# Patient Record
Sex: Male | Born: 1945
Health system: Southern US, Community
[De-identification: ages and names within clinical notes are randomized; demographics above are authoritative.]

## PROBLEM LIST (undated history)

## (undated) DIAGNOSIS — I509 Heart failure, unspecified: Secondary | ICD-10-CM

## (undated) DIAGNOSIS — I10 Essential (primary) hypertension: Secondary | ICD-10-CM

## (undated) DIAGNOSIS — C801 Malignant (primary) neoplasm, unspecified: Secondary | ICD-10-CM

## (undated) DIAGNOSIS — G4730 Sleep apnea, unspecified: Secondary | ICD-10-CM

## (undated) DIAGNOSIS — I472 Ventricular tachycardia, unspecified: Secondary | ICD-10-CM

## (undated) DIAGNOSIS — H269 Unspecified cataract: Secondary | ICD-10-CM

## (undated) DIAGNOSIS — I519 Heart disease, unspecified: Secondary | ICD-10-CM

## (undated) DIAGNOSIS — I499 Cardiac arrhythmia, unspecified: Secondary | ICD-10-CM

## (undated) DIAGNOSIS — E119 Type 2 diabetes mellitus without complications: Secondary | ICD-10-CM

## (undated) DIAGNOSIS — I219 Acute myocardial infarction, unspecified: Secondary | ICD-10-CM

## (undated) DIAGNOSIS — Q251 Coarctation of aorta: Secondary | ICD-10-CM

## (undated) DIAGNOSIS — Q253 Supravalvular aortic stenosis: Secondary | ICD-10-CM

## (undated) DIAGNOSIS — E785 Hyperlipidemia, unspecified: Secondary | ICD-10-CM

## (undated) HISTORY — DX: Essential (primary) hypertension: I10

## (undated) HISTORY — PX: CARDIAC DEFIBRILLATOR PLACEMENT: SHX171

## (undated) HISTORY — DX: Ventricular tachycardia, unspecified: I47.20

## (undated) HISTORY — DX: Heart failure, unspecified: I50.9

## (undated) HISTORY — PX: CARDIAC CATHETERIZATION: SHX172

## (undated) HISTORY — DX: Acute myocardial infarction, unspecified: I21.9

## (undated) HISTORY — DX: Malignant (primary) neoplasm, unspecified: C80.1

## (undated) HISTORY — DX: Sleep apnea, unspecified: G47.30

## (undated) HISTORY — PX: SKIN CANCER EXCISION: SHX779

## (undated) HISTORY — DX: Supravalvular aortic stenosis: Q25.3

## (undated) HISTORY — DX: Heart disease, unspecified: I51.9

## (undated) HISTORY — DX: Coarctation of aorta: Q25.1

## (undated) HISTORY — DX: Hyperlipidemia, unspecified: E78.5

## (undated) HISTORY — DX: Unspecified cataract: H26.9

## (undated) HISTORY — PX: CARDIAC SURGERY: SHX584

## (undated) HISTORY — DX: Type 2 diabetes mellitus without complications: E11.9

## (undated) HISTORY — DX: Cardiac arrhythmia, unspecified: I49.9

---

## 2016-07-09 DIAGNOSIS — I252 Old myocardial infarction: Secondary | ICD-10-CM | POA: Insufficient documentation

## 2016-07-11 DIAGNOSIS — R011 Cardiac murmur, unspecified: Secondary | ICD-10-CM | POA: Insufficient documentation

## 2016-07-11 DIAGNOSIS — I255 Ischemic cardiomyopathy: Secondary | ICD-10-CM | POA: Insufficient documentation

## 2016-07-27 DIAGNOSIS — I472 Ventricular tachycardia, unspecified: Secondary | ICD-10-CM | POA: Insufficient documentation

## 2016-12-14 LAB — HEMOGLOBIN A1C: Hemoglobin A1C: 6.5

## 2016-12-17 DIAGNOSIS — Z952 Presence of prosthetic heart valve: Secondary | ICD-10-CM | POA: Insufficient documentation

## 2017-02-11 DIAGNOSIS — Z9889 Other specified postprocedural states: Secondary | ICD-10-CM | POA: Insufficient documentation

## 2017-04-24 DIAGNOSIS — M19011 Primary osteoarthritis, right shoulder: Secondary | ICD-10-CM | POA: Insufficient documentation

## 2017-04-24 DIAGNOSIS — M1711 Unilateral primary osteoarthritis, right knee: Secondary | ICD-10-CM | POA: Insufficient documentation

## 2017-06-16 DIAGNOSIS — E785 Hyperlipidemia, unspecified: Secondary | ICD-10-CM | POA: Insufficient documentation

## 2017-11-06 LAB — BASIC METABOLIC PANEL
BUN: 14 (ref 4–21)
Creatinine: 0.9 (ref 0.6–1.3)
Glucose: 119
Potassium: 4.3 (ref 3.4–5.3)
Sodium: 138 (ref 137–147)

## 2017-11-06 LAB — LIPID PANEL
Cholesterol: 135 (ref 0–200)
HDL: 44 (ref 35–70)
LDL Cholesterol: 61
Triglycerides: 149 (ref 40–160)

## 2017-11-12 DIAGNOSIS — S2223XK Sternal manubrial dissociation, subsequent encounter for fracture with nonunion: Secondary | ICD-10-CM | POA: Insufficient documentation

## 2017-12-18 DIAGNOSIS — E663 Overweight: Secondary | ICD-10-CM | POA: Insufficient documentation

## 2017-12-20 LAB — HEMOGLOBIN A1C: Hemoglobin A1C: 6.5

## 2018-01-20 DIAGNOSIS — H269 Unspecified cataract: Secondary | ICD-10-CM | POA: Insufficient documentation

## 2018-03-16 DIAGNOSIS — M62838 Other muscle spasm: Secondary | ICD-10-CM | POA: Insufficient documentation

## 2018-03-16 DIAGNOSIS — M8700 Idiopathic aseptic necrosis of unspecified bone: Secondary | ICD-10-CM | POA: Insufficient documentation

## 2018-03-16 DIAGNOSIS — G8929 Other chronic pain: Secondary | ICD-10-CM | POA: Insufficient documentation

## 2018-03-16 DIAGNOSIS — M7501 Adhesive capsulitis of right shoulder: Secondary | ICD-10-CM | POA: Insufficient documentation

## 2018-03-16 DIAGNOSIS — M25511 Pain in right shoulder: Secondary | ICD-10-CM | POA: Insufficient documentation

## 2018-03-16 DIAGNOSIS — M419 Scoliosis, unspecified: Secondary | ICD-10-CM | POA: Insufficient documentation

## 2018-03-16 DIAGNOSIS — M7502 Adhesive capsulitis of left shoulder: Secondary | ICD-10-CM | POA: Insufficient documentation

## 2018-11-05 ENCOUNTER — Other Ambulatory Visit: Payer: Self-pay

## 2018-11-05 ENCOUNTER — Encounter: Payer: Self-pay | Admitting: Family Medicine

## 2018-11-05 ENCOUNTER — Ambulatory Visit (INDEPENDENT_AMBULATORY_CARE_PROVIDER_SITE_OTHER): Payer: Medicare Other | Admitting: Family Medicine

## 2018-11-05 VITALS — BP 129/80 | HR 74 | Ht 68.0 in | Wt 173.0 lb

## 2018-11-05 DIAGNOSIS — Z951 Presence of aortocoronary bypass graft: Secondary | ICD-10-CM

## 2018-11-05 DIAGNOSIS — E785 Hyperlipidemia, unspecified: Secondary | ICD-10-CM

## 2018-11-05 DIAGNOSIS — I350 Nonrheumatic aortic (valve) stenosis: Secondary | ICD-10-CM

## 2018-11-05 DIAGNOSIS — I209 Angina pectoris, unspecified: Secondary | ICD-10-CM

## 2018-11-05 DIAGNOSIS — I2510 Atherosclerotic heart disease of native coronary artery without angina pectoris: Secondary | ICD-10-CM

## 2018-11-05 DIAGNOSIS — E1159 Type 2 diabetes mellitus with other circulatory complications: Secondary | ICD-10-CM | POA: Insufficient documentation

## 2018-11-05 DIAGNOSIS — Z85820 Personal history of malignant melanoma of skin: Secondary | ICD-10-CM

## 2018-11-05 DIAGNOSIS — S2223XK Sternal manubrial dissociation, subsequent encounter for fracture with nonunion: Secondary | ICD-10-CM

## 2018-11-05 DIAGNOSIS — Z95810 Presence of automatic (implantable) cardiac defibrillator: Secondary | ICD-10-CM

## 2018-11-05 DIAGNOSIS — Z952 Presence of prosthetic heart valve: Secondary | ICD-10-CM

## 2018-11-05 DIAGNOSIS — E119 Type 2 diabetes mellitus without complications: Secondary | ICD-10-CM

## 2018-11-05 DIAGNOSIS — I10 Essential (primary) hypertension: Secondary | ICD-10-CM

## 2018-11-05 DIAGNOSIS — M1711 Unilateral primary osteoarthritis, right knee: Secondary | ICD-10-CM

## 2018-11-05 DIAGNOSIS — I420 Dilated cardiomyopathy: Secondary | ICD-10-CM

## 2018-11-05 DIAGNOSIS — R918 Other nonspecific abnormal finding of lung field: Secondary | ICD-10-CM

## 2018-11-05 DIAGNOSIS — M19011 Primary osteoarthritis, right shoulder: Secondary | ICD-10-CM

## 2018-11-05 DIAGNOSIS — H269 Unspecified cataract: Secondary | ICD-10-CM

## 2018-11-05 DIAGNOSIS — G4733 Obstructive sleep apnea (adult) (pediatric): Secondary | ICD-10-CM | POA: Insufficient documentation

## 2018-11-05 DIAGNOSIS — E1169 Type 2 diabetes mellitus with other specified complication: Secondary | ICD-10-CM

## 2018-11-05 NOTE — Progress Notes (Signed)
BP 129/80   Pulse 74   Ht 5\' 8"  (1.727 m)   Wt 173 lb (78.5 kg)   SpO2 98%   BMI 26.30 kg/m    Subjective:    Patient ID: Samuel Morris, male    DOB: Apr 20, 1945, 73 y.o.   MRN: [id number]  HPI: Samuel Morris is a 73 y.o. male who presents today via video visit to establish care. He has been seeing his cardiologists at [facility] regularly. He last saw his PCP in the fall for clearance for cataract surgery. He had not seen them since the February previous.  Chief Complaint  Patient presents with  . Establish Care   Attempted to call patient for video visit- due to technical difficulties, video visit unable to be done. Will get him in for in-person visit tomorrow. Chart reviewed and history updated.   Active Ambulatory Problems    Diagnosis Date Noted  . CAD (coronary artery disease) 11/05/2018  . Hyperlipidemia associated with type 2 diabetes mellitus ([place]) 11/05/2018  . Diabetes mellitus without complication ([place]) XX123456  . OSA (obstructive sleep apnea) 11/05/2018  . HTN (hypertension) 11/05/2018  . Aortic stenosis 11/05/2018  . Presence of automatic (implantable) cardiac defibrillator 11/05/2018  . Hx of melanoma of skin 11/05/2018  . Angina pectoris ([place]) 11/05/2018  . Pulmonary nodules 11/05/2018  . Dilated cardiomyopathy ([place]) 11/05/2018   Resolved Ambulatory Problems    Diagnosis Date Noted  . No Resolved Ambulatory Problems   Past Medical History:  Diagnosis Date  . Cancer ([place])   . Cataract   . Heart attack ([place])   . Heart disease   . Hyperlipidemia   . Hypertension   . Sleep apnea   . Stenosis of aorta    Past Surgical History:  Procedure Laterality Date  . CARDIAC DEFIBRILLATOR PLACEMENT    . CARDIAC SURGERY    CARDIAC CATHETERIZATION 09/04/2016  . CARDIAC DEFIBRILLATOR PLACEMENT 02/14/2011  [employer] - ID CARD ON CHART  . CARDIAC DEFIBRILLATOR PLACEMENT Left 02/13/2017  ID CARD ON CHART  . CARDIAC VALVE REPLACEMENT 12/17/2016  AVR  . CATARACT  EXTRACTION Left  . CORONARIES LHC WITH OR WITHOUT LV RHC N/A 09/04/2016  Procedure: Coronaries LHC with or without LV RHC; Surgeon: Loel Dubonnet, MD; Location: [facility] INVASIVE CARDIOLOGY; Service: Cardiovascular; Laterality: N/A;  . CORONARY ARTERY BYPASS GRAFT 12/17/2016  CAB X 2/ AVR  . REMOVE AND REPLACE GENERATOR: ICD DUAL CHAMBER N/A 09/10/2017  Procedure: REMOVE AND REPLACE GENERATOR: ICD DUAL CHAMBER; Surgeon: Balinda Quails, MD; Location: [facility] INVASIVE CARDIOLOGY; Service: Electrophysiology; Laterality: N/A;  . SKIN BIOPSY  MELANOMA - SCALP - NO CHEMO OR RADIATION   Outpatient Encounter Medications as of 11/05/2018  Medication Sig  . Ascorbic Acid (VITAMIN C PO) Take 100 mg by mouth daily.  Marland Kitchen aspirin EC 81 MG tablet Take by mouth.  Marland Kitchen Ertugliflozin L-PyroglutamicAc (STEGLATRO) 5 MG TABS Take by mouth.  Marland Kitchen GLUCOSAMINE-CHONDROITIN PO Take 1 tablet by mouth daily.  Marland Kitchen amLODipine (NORVASC) 10 MG tablet Take by mouth.  . Cholecalciferol (VITAMIN D-1000 MAX ST) 25 MCG (1000 UT) tablet Take by mouth.  . ENTRESTO 49-51 MG TK 1 T PO BID  . ezetimibe (ZETIA) 10 MG tablet TK 1 T PO D  . metoprolol succinate (TOPROL-XL) 100 MG 24 hr tablet TK 1 AND 1/2  TABLETS PO D  . rosuvastatin (CRESTOR) 20 MG tablet   . spironolactone (ALDACTONE) 25 MG tablet TK 1 T PO QD  . vitamin B-12 (CYANOCOBALAMIN) 1000 MCG  tablet Take by mouth.  . [DISCONTINUED] isosorbide mononitrate (IMDUR) 120 MG 24 hr tablet Take by mouth.   No facility-administered encounter medications on file as of 11/05/2018.    No Known Allergies Social History   Socioeconomic History  . Marital status: Married    Spouse name: Not on file  . Number of children: Not on file  . Years of education: Not on file  . Highest education level: Not on file  Occupational History  . Not on file  Social Needs  . Financial resource strain: Not on file  . Food insecurity    Worry: Not on file    Inability: Not on file  .  Transportation needs    Medical: Not on file    Non-medical: Not on file  Tobacco Use  . Smoking status: Never Smoker  . Smokeless tobacco: Never Used  Substance and Sexual Activity  . Alcohol use: Yes    Frequency: Never    Comment: On occasion  . Drug use: Never  . Sexual activity: Yes    Birth control/protection: None  Lifestyle  . Physical activity    Days per week: Not on file    Minutes per session: Not on file  . Stress: Not on file  Relationships  . Social Herbalist on phone: Not on file    Gets together: Not on file    Attends religious service: Not on file    Active member of club or organization: Not on file    Attends meetings of clubs or organizations: Not on file    Relationship status: Not on file  Other Topics Concern  . Not on file  Social History Narrative  . Not on file   Family History  Problem Relation Age of Onset  . Heart disease Mother   . Heart attack Father   . Arthritis Sister   . Hypertension Sister   . Hypertension Brother   . Heart disease Paternal Grandmother   . Heart murmur Sister     Per HPI unless specifically indicated above     Objective:    BP 129/80   Pulse 74   Ht 5\' 8"  (1.727 m)   Wt 173 lb (78.5 kg)   SpO2 98%   BMI 26.30 kg/m   Wt Readings from Last 3 Encounters:  11/05/18 173 lb (78.5 kg)     No results found for this or any previous visit.    Assessment & Plan:   Problem List Items Addressed This Visit      Cardiovascular and Mediastinum   CAD (coronary artery disease)   Relevant Medications   spironolactone (ALDACTONE) 25 MG tablet   ENTRESTO 49-51 MG   rosuvastatin (CRESTOR) 20 MG tablet   metoprolol succinate (TOPROL-XL) 100 MG 24 hr tablet   ezetimibe (ZETIA) 10 MG tablet   amLODipine (NORVASC) 10 MG tablet   aspirin EC 81 MG tablet   HTN (hypertension)   Relevant Medications   spironolactone (ALDACTONE) 25 MG tablet   ENTRESTO 49-51 MG   rosuvastatin (CRESTOR) 20 MG tablet    metoprolol succinate (TOPROL-XL) 100 MG 24 hr tablet   ezetimibe (ZETIA) 10 MG tablet   amLODipine (NORVASC) 10 MG tablet   aspirin EC 81 MG tablet   Aortic stenosis   Relevant Medications   spironolactone (ALDACTONE) 25 MG tablet   ENTRESTO 49-51 MG   rosuvastatin (CRESTOR) 20 MG tablet   metoprolol succinate (TOPROL-XL) 100 MG 24 hr tablet  ezetimibe (ZETIA) 10 MG tablet   amLODipine (NORVASC) 10 MG tablet   aspirin EC 81 MG tablet   Angina pectoris (HCC)   Relevant Medications   spironolactone (ALDACTONE) 25 MG tablet   ENTRESTO 49-51 MG   rosuvastatin (CRESTOR) 20 MG tablet   metoprolol succinate (TOPROL-XL) 100 MG 24 hr tablet   ezetimibe (ZETIA) 10 MG tablet   amLODipine (NORVASC) 10 MG tablet   aspirin EC 81 MG tablet   Dilated cardiomyopathy (HCC) - Primary   Relevant Medications   spironolactone (ALDACTONE) 25 MG tablet   ENTRESTO 49-51 MG   rosuvastatin (CRESTOR) 20 MG tablet   metoprolol succinate (TOPROL-XL) 100 MG 24 hr tablet   ezetimibe (ZETIA) 10 MG tablet   amLODipine (NORVASC) 10 MG tablet   aspirin EC 81 MG tablet     Respiratory   OSA (obstructive sleep apnea)     Endocrine   Hyperlipidemia associated with type 2 diabetes mellitus (HCC)   Relevant Medications   rosuvastatin (CRESTOR) 20 MG tablet   Ertugliflozin L-PyroglutamicAc (STEGLATRO) 5 MG TABS   ezetimibe (ZETIA) 10 MG tablet   aspirin EC 81 MG tablet   Diabetes mellitus without complication (HCC)   Relevant Medications   rosuvastatin (CRESTOR) 20 MG tablet   Ertugliflozin L-PyroglutamicAc (STEGLATRO) 5 MG TABS   aspirin EC 81 MG tablet     Other   Presence of automatic (implantable) cardiac defibrillator   Hx of melanoma of skin   Pulmonary nodules    Other Visit Diagnoses    S/P CABG x 2       S/P AVR (aortic valve replacement)       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 WILL SEE TOMORROW. CHART REVIEWED AND UPDATED TODAY  Follow up plan: Return Tomorrow in person (8:45 or 11:15 OK).

## 2018-11-06 ENCOUNTER — Encounter: Payer: Self-pay | Admitting: Family Medicine

## 2018-11-06 ENCOUNTER — Ambulatory Visit (INDEPENDENT_AMBULATORY_CARE_PROVIDER_SITE_OTHER): Payer: Medicare Other | Admitting: Family Medicine

## 2018-11-06 VITALS — BP 134/76 | HR 60 | Temp 98.5°F | Ht 65.2 in | Wt 173.2 lb

## 2018-11-06 DIAGNOSIS — Z85820 Personal history of malignant melanoma of skin: Secondary | ICD-10-CM

## 2018-11-06 DIAGNOSIS — Z23 Encounter for immunization: Secondary | ICD-10-CM

## 2018-11-06 DIAGNOSIS — I10 Essential (primary) hypertension: Secondary | ICD-10-CM

## 2018-11-06 DIAGNOSIS — I420 Dilated cardiomyopathy: Secondary | ICD-10-CM

## 2018-11-06 DIAGNOSIS — E1169 Type 2 diabetes mellitus with other specified complication: Secondary | ICD-10-CM | POA: Diagnosis not present

## 2018-11-06 DIAGNOSIS — E785 Hyperlipidemia, unspecified: Secondary | ICD-10-CM

## 2018-11-06 DIAGNOSIS — E119 Type 2 diabetes mellitus without complications: Secondary | ICD-10-CM | POA: Diagnosis not present

## 2018-11-06 DIAGNOSIS — I209 Angina pectoris, unspecified: Secondary | ICD-10-CM

## 2018-11-06 DIAGNOSIS — Z1159 Encounter for screening for other viral diseases: Secondary | ICD-10-CM

## 2018-11-06 DIAGNOSIS — I2510 Atherosclerotic heart disease of native coronary artery without angina pectoris: Secondary | ICD-10-CM

## 2018-11-06 DIAGNOSIS — R3911 Hesitancy of micturition: Secondary | ICD-10-CM

## 2018-11-06 LAB — UA/M W/RFLX CULTURE, ROUTINE
Bilirubin, UA: NEGATIVE
Ketones, UA: NEGATIVE
Leukocytes,UA: NEGATIVE
Nitrite, UA: NEGATIVE
Protein,UA: NEGATIVE
RBC, UA: NEGATIVE
Specific Gravity, UA: 1.015 (ref 1.005–1.030)
Urobilinogen, Ur: 0.2 mg/dL (ref 0.2–1.0)
pH, UA: 5 (ref 5.0–7.5)

## 2018-11-06 LAB — MICROALBUMIN, URINE WAIVED
Creatinine, Urine Waived: 50 mg/dL (ref 10–300)
Microalb, Ur Waived: 10 mg/L (ref 0–19)

## 2018-11-06 LAB — BAYER DCA HB A1C WAIVED: HB A1C (BAYER DCA - WAIVED): 5.8 % (ref <7.0)

## 2018-11-06 MED ORDER — DICLOFENAC SODIUM 1 % TD GEL: 4.0000 g | Freq: Four times a day (QID) | TRANSDERMAL | 4 refills | Status: DC

## 2018-11-06 NOTE — Assessment & Plan Note (Signed)
Under good control on current regimen. Continue current regimen. Continue to monitor. Call with any concerns. Refills given. Labs drawn today.   

## 2018-11-06 NOTE — Assessment & Plan Note (Signed)
Continue to follow with cardiology. Continue to monitor. Call with any concerns.

## 2018-11-06 NOTE — Assessment & Plan Note (Signed)
Continue to follow with dermatology. Continue to monitor. Call with any concerns.

## 2018-11-06 NOTE — Progress Notes (Signed)
BP 134/76   Pulse 60   Temp 98.5 F (36.9 C)   Ht 5' 5.2" (1.656 m)   Wt 173 lb 4 oz (78.6 kg)   SpO2 97%   BMI 28.66 kg/m    Subjective:    Patient ID: Samuel Morris, male    DOB: November 04, 1945, 73 y.o.   MRN: [id number]  HPI: Samuel Morris is a 73 y.o. male who presents today to establish care. We attempted a virtual visit yesterday and were not able to connect. He has been seeing cardiology at [facility]. He was seeing primary care there as well. He last saw them in October 2019 for clearance for a cataract surgery and had seen them in February prior to that.   Chief Complaint  Patient presents with  . Establish Care  . Hypertension  . Hyperlipidemia  . Diabetes   Hx of melanoma- seeing dermatology yearly. Cleared.   HYPERTENSION / HYPERLIPIDEMIA Satisfied with current treatment? yes Duration of hypertension: chronic BP monitoring frequency: not checking BP medication side effects: no Past BP meds: entresto, metoprolol, spironalactone Duration of hyperlipidemia: chronic Cholesterol medication side effects: no Cholesterol supplements: none Past cholesterol medications: crestor, zetia Medication compliance: excellent compliance Aspirin: yes Recent stressors: yes Recurrent headaches: no Visual changes: no Palpitations: no Dyspnea: no Chest pain: no Lower extremity edema: no Dizzy/lightheaded: no  DIABETES Hypoglycemic episodes:no Polydipsia/polyuria: no Visual disturbance: no Chest pain: no Paresthesias: no Glucose Monitoring: yes  Accucheck frequency: Daily  Fasting glucose: under 100 Taking Insulin?: no Blood Pressure Monitoring: not checking Retinal Examination: yes Foot Exam: Done today Diabetic Education: Completed Pneumovax: Up to Date Influenza: Given today Aspirin: no   Active Ambulatory Problems    Diagnosis Date Noted  . CAD (coronary artery disease) 11/05/2018  . Hyperlipidemia associated with type 2 diabetes mellitus ([place]) 11/05/2018  .  Diabetes mellitus without complication ([place]) XX123456  . OSA (obstructive sleep apnea) 11/05/2018  . HTN (hypertension) 11/05/2018  . Aortic stenosis 11/05/2018  . Presence of automatic (implantable) cardiac defibrillator 11/05/2018  . Hx of melanoma of skin 11/05/2018  . Angina pectoris ([place]) 11/05/2018  . Pulmonary nodules 11/05/2018  . Dilated cardiomyopathy ([place]) 11/05/2018   Resolved Ambulatory Problems    Diagnosis Date Noted  . No Resolved Ambulatory Problems   Past Medical History:  Diagnosis Date  . Cancer ([place])   . Cataract   . Heart attack ([place])   . Heart disease   . Hyperlipidemia   . Hypertension   . Sleep apnea   . Stenosis of aorta    Past Surgical History:  Procedure Laterality Date  . CARDIAC DEFIBRILLATOR PLACEMENT    . CARDIAC SURGERY     Outpatient Encounter Medications as of 11/06/2018  Medication Sig  . Calcium-Magnesium 100-50 MG TABS Take by mouth.  . nitroGLYCERIN (NITROSTAT) 0.4 MG SL tablet Place 0.4 mg under the tongue every 5 (five) minutes as needed for chest pain.  . Ascorbic Acid (VITAMIN C PO) Take 100 mg by mouth daily.  Marland Kitchen aspirin EC 81 MG tablet Take by mouth.  . Cholecalciferol (VITAMIN D-1000 MAX ST) 25 MCG (1000 UT) tablet Take by mouth.  . diclofenac sodium (VOLTAREN) 1 % GEL Apply 4 g topically 4 (four) times daily.  Marland Kitchen ENTRESTO 49-51 MG TK 1 T PO BID  . Ertugliflozin L-PyroglutamicAc (STEGLATRO) 5 MG TABS Take by mouth.  . ezetimibe (ZETIA) 10 MG tablet TK 1 T PO D  . GLUCOSAMINE-CHONDROITIN PO Take 1 tablet by mouth daily.  Marland Kitchen  metoprolol succinate (TOPROL-XL) 100 MG 24 hr tablet TK 1 AND 1/2  TABLETS PO D  . rosuvastatin (CRESTOR) 20 MG tablet   . spironolactone (ALDACTONE) 25 MG tablet TK 1 T PO QD  . vitamin B-12 (CYANOCOBALAMIN) 1000 MCG tablet Take by mouth.  . [DISCONTINUED] amLODipine (NORVASC) 10 MG tablet Take by mouth.   No facility-administered encounter medications on file as of 11/06/2018.    No Known  Allergies  Social History   Socioeconomic History  . Marital status: Married    Spouse name: Not on file  . Number of children: Not on file  . Years of education: Not on file  . Highest education level: Not on file  Occupational History  . Not on file  Social Needs  . Financial resource strain: Not on file  . Food insecurity    Worry: Not on file    Inability: Not on file  . Transportation needs    Medical: Not on file    Non-medical: Not on file  Tobacco Use  . Smoking status: Never Smoker  . Smokeless tobacco: Never Used  Substance and Sexual Activity  . Alcohol use: Yes    Frequency: Never    Comment: On occasion  . Drug use: Never  . Sexual activity: Yes    Birth control/protection: None  Lifestyle  . Physical activity    Days per week: Not on file    Minutes per session: Not on file  . Stress: Not on file  Relationships  . Social Herbalist on phone: Not on file    Gets together: Not on file    Attends religious service: Not on file    Active member of club or organization: Not on file    Attends meetings of clubs or organizations: Not on file    Relationship status: Not on file  . Intimate partner violence    Fear of current or ex partner: Not on file    Emotionally abused: Not on file    Physically abused: Not on file    Forced sexual activity: Not on file  Other Topics Concern  . Not on file  Social History Narrative  . Not on file   Family History  Problem Relation Age of Onset  . Heart disease Mother   . Heart attack Father   . Arthritis Sister   . Hypertension Sister   . Hypertension Brother   . Heart disease Paternal Grandmother   . Heart murmur Sister    Review of Systems  Constitutional: Negative.        Has been losing weight- has been trying to do it  Respiratory: Negative.   Cardiovascular: Negative.   Gastrointestinal: Negative.   Musculoskeletal: Positive for arthralgias. Negative for back pain, gait problem, joint  swelling, myalgias, neck pain and neck stiffness.  Skin: Negative.   Neurological: Negative.   Psychiatric/Behavioral: Negative.     Per HPI unless specifically indicated above     Objective:    BP 134/76   Pulse 60   Temp 98.5 F (36.9 C)   Ht 5' 5.2" (1.656 m)   Wt 173 lb 4 oz (78.6 kg)   SpO2 97%   BMI 28.66 kg/m   Wt Readings from Last 3 Encounters:  11/06/18 173 lb 4 oz (78.6 kg)  11/05/18 173 lb (78.5 kg)    Physical Exam Vitals signs and nursing note reviewed.  Constitutional:      General: He is not in  acute distress.    Appearance: Normal appearance. He is not ill-appearing, toxic-appearing or diaphoretic.  HENT:     Head: Normocephalic and atraumatic.     Right Ear: External ear normal.     Left Ear: External ear normal.     Nose: Nose normal.     Mouth/Throat:     Mouth: Mucous membranes are moist.     Pharynx: Oropharynx is clear.  Eyes:     General: No scleral icterus.       Right eye: No discharge.        Left eye: No discharge.     Extraocular Movements: Extraocular movements intact.     Conjunctiva/sclera: Conjunctivae normal.     Pupils: Pupils are equal, round, and reactive to light.  Neck:     Musculoskeletal: Normal range of motion and neck supple.  Cardiovascular:     Rate and Rhythm: Normal rate and regular rhythm.     Pulses: Normal pulses.     Heart sounds: Murmur present. No friction rub. No gallop.   Pulmonary:     Effort: Pulmonary effort is normal. No respiratory distress.     Breath sounds: Normal breath sounds. No stridor. No wheezing, rhonchi or rales.  Chest:     Chest wall: No tenderness.  Musculoskeletal: Normal range of motion.  Skin:    General: Skin is warm and dry.     Capillary Refill: Capillary refill takes less than 2 seconds.     Coloration: Skin is not jaundiced or pale.     Findings: No bruising, erythema, lesion or rash.  Neurological:     General: No focal deficit present.     Mental Status: He is alert and  oriented to person, place, and time. Mental status is at baseline.  Psychiatric:        Mood and Affect: Mood normal.        Behavior: Behavior normal.        Thought Content: Thought content normal.        Judgment: Judgment normal.     Results for orders placed or performed in visit on [id number]  Basic metabolic panel  Result Value Ref Range   Glucose 119    BUN 14 4 - 21   Creatinine 0.9 0.6 - 1.3   Potassium 4.3 3.4 - 5.3   Sodium 138 137 - 147  Lipid panel  Result Value Ref Range   Triglycerides 149 40 - 160   Cholesterol 135 0 - 200   HDL 44 35 - 70   LDL Cholesterol 61   Hemoglobin A1c  Result Value Ref Range   Hemoglobin A1C 6.5   Hemoglobin A1c  Result Value Ref Range   Hemoglobin A1C 6.5       Assessment & Plan:   Problem List Items Addressed This Visit      Cardiovascular and Mediastinum   CAD (coronary artery disease)    Continue to follow with cardiology. Continue to monitor. Call with any concerns.       Relevant Medications   nitroGLYCERIN (NITROSTAT) 0.4 MG SL tablet   HTN (hypertension) - Primary    Under good control on current regimen. Continue current regimen. Continue to monitor. Call with any concerns. Refills given. Labs drawn today.       Relevant Medications   nitroGLYCERIN (NITROSTAT) 0.4 MG SL tablet   Other Relevant Orders   CBC with Differential/Platelet   Comprehensive metabolic panel   Microalbumin, Urine Waived  TSH   Referral to Chronic Care Management Services   Angina pectoris [facility])    Continue to follow with cardiology. Continue to monitor. Call with any concerns.       Relevant Medications   nitroGLYCERIN (NITROSTAT) 0.4 MG SL tablet   Dilated cardiomyopathy (HCC)    Continue to follow with cardiology. Continue to monitor. Call with any concerns.       Relevant Medications   nitroGLYCERIN (NITROSTAT) 0.4 MG SL tablet     Endocrine   Hyperlipidemia associated with type 2 diabetes mellitus ([place])    Under good  control on current regimen. Continue current regimen. Continue to monitor. Call with any concerns. Refills given. Labs drawn today.       Relevant Orders   Comprehensive metabolic panel   Lipid Panel w/o Chol/HDL Ratio   Diabetes mellitus without complication ([place])    Under good control on current regimen. Continue current regimen. Continue to monitor. Call with any concerns. Refills given. Labs drawn today.       Relevant Orders   CBC with Differential/Platelet   Bayer DCA Hb A1c Waived   Comprehensive metabolic panel   Microalbumin, Urine Waived   TSH   UA/M w/rflx Culture, Routine     Other   Hx of melanoma of skin    Continue to follow with dermatology. Continue to monitor. Call with any concerns.        Other Visit Diagnoses    Encounter for hepatitis C screening test for low risk patient       Labs drawn today. Await results.    Relevant Orders   Hepatitis C Antibody   Hesitancy       Labs drawn today. Await results.    Relevant Orders   PSA   Needs flu shot       Flu shot given today.   Relevant Orders   Flu Vaccine QUAD High Dose(Fluad) (Completed)       Follow up plan: Return in about 6 months (around 05/09/2019), or wellness/follow up, for Dr. Cletus Gash Endocrine- [facility] on [address]- Records.

## 2018-11-07 LAB — CBC WITH DIFFERENTIAL/PLATELET
Basophils Absolute: 0 10*3/uL (ref 0.0–0.2)
Basos: 1 %
EOS (ABSOLUTE): 0.3 10*3/uL (ref 0.0–0.4)
Eos: 5 %
Hematocrit: 47.8 % (ref 37.5–51.0)
Hemoglobin: 15.8 g/dL (ref 13.0–17.7)
Immature Grans (Abs): 0 10*3/uL (ref 0.0–0.1)
Immature Granulocytes: 0 %
Lymphocytes Absolute: 1.5 10*3/uL (ref 0.7–3.1)
Lymphs: 21 %
MCH: 28.5 pg (ref 26.6–33.0)
MCHC: 33.1 g/dL (ref 31.5–35.7)
MCV: 86 fL (ref 79–97)
Monocytes Absolute: 0.8 10*3/uL (ref 0.1–0.9)
Monocytes: 12 %
Neutrophils Absolute: 4.3 10*3/uL (ref 1.4–7.0)
Neutrophils: 61 %
Platelets: 204 10*3/uL (ref 150–450)
RBC: 5.54 x10E6/uL (ref 4.14–5.80)
RDW: 13 % (ref 11.6–15.4)
WBC: 6.9 10*3/uL (ref 3.4–10.8)

## 2018-11-07 LAB — HEPATITIS C ANTIBODY: Hep C Virus Ab: <0.1 s/co ratio (ref 0.0–0.9)

## 2018-11-07 LAB — LIPID PANEL W/O CHOL/HDL RATIO
Cholesterol, Total: 125 mg/dL (ref 100–199)
HDL: 51 mg/dL (ref >39)
LDL Calculated: 58 mg/dL (ref 0–99)
Triglycerides: 82 mg/dL (ref 0–149)
VLDL Cholesterol Cal: 16 mg/dL (ref 5–40)

## 2018-11-07 LAB — COMPREHENSIVE METABOLIC PANEL
ALT: 12 IU/L (ref 0–44)
AST: 14 IU/L (ref 0–40)
Albumin/Globulin Ratio: 2.3 — ABNORMAL HIGH (ref 1.2–2.2)
Albumin: 4.6 g/dL (ref 3.7–4.7)
Alkaline Phosphatase: 103 IU/L (ref 39–117)
BUN/Creatinine Ratio: 16 (ref 10–24)
BUN: 12 mg/dL (ref 8–27)
Bilirubin Total: 0.7 mg/dL (ref 0.0–1.2)
CO2: 22 mmol/L (ref 20–29)
Calcium: 9.3 mg/dL (ref 8.6–10.2)
Chloride: 102 mmol/L (ref 96–106)
Creatinine, Ser: 0.76 mg/dL (ref 0.76–1.27)
GFR calc Af Amer: 104 mL/min/{1.73_m2} (ref >59)
GFR calc non Af Amer: 90 mL/min/{1.73_m2} (ref >59)
Globulin, Total: 2 g/dL (ref 1.5–4.5)
Glucose: 100 mg/dL — ABNORMAL HIGH (ref 65–99)
Potassium: 4.6 mmol/L (ref 3.5–5.2)
Sodium: 139 mmol/L (ref 134–144)
Total Protein: 6.6 g/dL (ref 6.0–8.5)

## 2018-11-07 LAB — TSH: TSH: 3.24 u[IU]/mL (ref 0.450–4.500)

## 2018-11-07 LAB — PSA: Prostate Specific Ag, Serum: 2.9 ng/mL (ref 0.0–4.0)

## 2018-11-10 ENCOUNTER — Encounter: Payer: Self-pay | Admitting: Family Medicine

## 2018-11-26 ENCOUNTER — Ambulatory Visit: Payer: Medicare Other | Admitting: Pharmacist

## 2018-11-26 DIAGNOSIS — I420 Dilated cardiomyopathy: Secondary | ICD-10-CM

## 2018-11-26 NOTE — Chronic Care Management (AMB) (Signed)
Chronic Care Management   Note  11/26/2018 Name: Samuel Morris MRN: [id number] DOB: June 25, 1945   Subjective:  Samuel Morris is a 73 y.o. year old male who is a primary care patient of Valerie Roys, DO. The CCM team was consulted for assistance with chronic disease management and care coordination needs.    Contacted patient for medication access concerns today.    [name] was given information about Chronic Care Management services today including:  1. CCM service includes personalized support from designated clinical staff supervised by his physician, including individualized plan of care and coordination with other care providers 2. 24/7 contact phone numbers for assistance for urgent and routine care needs. 3. Service will only be billed when office clinical staff spend 20 minutes or more in a month to coordinate care. 4. Only one practitioner may furnish and bill the service in a calendar month. 5. The patient may stop CCM services at any time (effective at the end of the month) by phone call to the office staff. 6. The patient will be responsible for cost sharing (co-pay) of up to 20% of the service fee (after annual deductible is met).  Patient agreed to services and verbal consent obtained.   Review of patient status, including review of consultants reports, laboratory and other test data, was performed as part of comprehensive evaluation and provision of chronic care management services.   Objective:  Lab Results  Component Value Date   CREATININE 0.76 11/06/2018   CREATININE 0.9 11/06/2017    Lab Results  Component Value Date   HGBA1C 5.8 11/06/2018       Component Value Date/Time   CHOL 125 11/06/2018 0919   TRIG 82 11/06/2018 0919   HDL 51 11/06/2018 0919   LDLCALC 58 11/06/2018 0919    Clinical ASCVD: Yes - notes hx MI at least 15 years ago, hx CABG noted.    BP Readings from Last 3 Encounters:  11/06/18 134/76  11/05/18 129/80    No Known  Allergies  Medications Reviewed Today    Reviewed by De Hollingshead, [facility] (Pharmacist) on 11/26/18 at 1452  Med List Status: <None>  Medication Order Taking? Sig Documenting Provider Last Dose Status Informant  Ascorbic Acid (VITAMIN C PO) [id number] Yes Take 100 mg by mouth daily. [provider] Taking Active   aspirin EC 81 MG tablet [id number] Yes Take by mouth. [provider] Taking Active   Calcium-Magnesium 100-50 MG TABS [id number] Yes Take by mouth. [provider] Taking Active   Cholecalciferol (VITAMIN D-1000 MAX ST) 25 MCG (1000 UT) tablet [id number] Yes Take by mouth. [provider] Taking Active   diclofenac sodium (VOLTAREN) 1 % GEL [id number] No Apply 4 g topically 4 (four) times daily.  Patient not taking: Reported on 11/26/2018   Valerie Roys, DO Not Taking Active   ENTRESTO 49-51 MG [id number] Yes TK 1 T PO BID [provider] Taking Active            Med Note ([place] Nov 26, 2018  2:17 PM) Only taking once daily   Ertugliflozin L-PyroglutamicAc (STEGLATRO) 5 MG TABS [id number] Yes Take by mouth. [provider] Taking Active   ezetimibe (ZETIA) 10 MG tablet [id number] Yes TK 1 T PO D [provider] Taking Active   GLUCOSAMINE-CHONDROITIN PO [id number] Yes Take 1 tablet by mouth daily. [provider] Taking Active   metoprolol succinate (TOPROL-XL) 100 MG 24 hr tablet [id number] Yes  TK 1 AND 1/2  TABLETS PO D [provider] Taking Active   nitroGLYCERIN (NITROSTAT) 0.4 MG SL tablet [id number] No Place 0.4 mg under the tongue every 5 (five) minutes as needed for chest pain. [provider] Not Taking Active   rosuvastatin (CRESTOR) 20 MG tablet [id number] Yes Take 20 mg by mouth daily.  [provider] Taking Active   spironolactone (ALDACTONE) 25 MG tablet [id number] Yes TK 1 T PO QD [provider] Taking Active   vitamin B-12 (CYANOCOBALAMIN) 1000  MCG tablet [id number] Yes Take by mouth. [provider] Taking Active            Assessment:   Goals Addressed            This Visit's Progress     Patient Stated    "I can't afford my medication" (pt-stated)       Current Barriers:   Financial Barriers: patient has [place] D, Medicare A/B, and Plan F supplement and reports copay for [name] is cost prohibitive at this time. He notes he is taking the medication once daily to stretch his supply o Endorses that he follows closely w/ cardiologist Kerry Dory, [place] (generally goes to [facility] office) o Also sees endocrinologist with [employer]. They helped him apply for [facility] patient assistance, so he is receiving this medication for free. He wonders about d/c, due to his well controlled A1c  Pharmacist Clinical Goal(s):   Over the next 30 days, patient will work with PharmD and providers to relieve medication access concerns  Interventions:  Comprehensive medication review completed; medication list updated in electronic medical record.   Entresto by [employer]: Patient meets income criteria for this medication's patient assistance program. Reviewed application process. Patient will provide proof of income and will sign application. Will collaborate with primary care provider Dr. Wynetta Emery for their portion of application. Once completed, will submit to [employer] patient assistance program.  Reviewed the HFrEF benefits of SGLT2s with patient, explained why we would ideally like to continue Steglatro therapy, especially if affordable for patient.   Patient Self Care Activities:   Patient will provide necessary portions of application   Initial goal documentation        Plan: - Will collaborate with patient and provider on completion of Entresto application as above  Catie Darnelle Maffucci, PharmD Clinical Pharmacist [place] [phone]

## 2018-11-26 NOTE — Patient Instructions (Signed)
Visit Information  Goals Addressed            This Visit's Progress     Patient Stated   . "I can't afford my medication" (pt-stated)       Current Barriers:  . Financial Barriers: patient has [place] D, Medicare A/B, and Plan F supplement and reports copay for [name] is cost prohibitive at this time. He notes he is taking the medication once daily to stretch his supply o Endorses that he follows closely w/ cardiologist Kerry Dory, [place] (generally goes to [facility] office) o Also sees endocrinologist with [employer]. They helped him apply for [facility] patient assistance, so he is receiving this medication for free. He wonders about d/c, due to his well controlled A1c  Pharmacist Clinical Goal(s):  Marland Kitchen Over the next 30 days, patient will work with PharmD and providers to relieve medication access concerns  Interventions: . Comprehensive medication review completed; medication list updated in electronic medical record.  [name] by [employer]: Patient meets income criteria for this medication's patient assistance program. Reviewed application process. Patient will provide proof of income and will sign application. Will collaborate with primary care provider Dr. Wynetta Emery for their portion of application. Once completed, will submit to [employer] patient assistance program. . Reviewed the HFrEF benefits of SGLT2s with patient, explained why we would ideally like to continue Steglatro therapy, especially if affordable for patient.   Patient Self Care Activities:  . Patient will provide necessary portions of application   Initial goal documentation        Samuel Morris was given information about Chronic Care Management services today including:  1. CCM service includes personalized support from designated clinical staff supervised by his physician, including individualized plan of care and coordination with other care providers 2. 24/7 contact phone numbers for assistance  for urgent and routine care needs. 3. Service will only be billed when office clinical staff spend 20 minutes or more in a month to coordinate care. 4. Only one practitioner may furnish and bill the service in a calendar month. 5. The patient may stop CCM services at any time (effective at the end of the month) by phone call to the office staff. 6. The patient will be responsible for cost sharing (co-pay) of up to 20% of the service fee (after annual deductible is met).  Patient agreed to services and verbal consent obtained.   The patient verbalized understanding of instructions provided today and declined a print copy of patient instruction materials.    Plan: - Will collaborate with patient and provider on completion of Entresto application as above  Samuel Morris, PharmD Clinical Pharmacist [place] [phone]

## 2018-12-08 ENCOUNTER — Ambulatory Visit: Payer: Self-pay | Admitting: Pharmacist

## 2018-12-08 DIAGNOSIS — I420 Dilated cardiomyopathy: Secondary | ICD-10-CM

## 2018-12-08 NOTE — Patient Instructions (Addendum)
Visit Information  Goals Addressed            This Visit's Progress     Patient Stated   . "I can't afford my medication" (pt-stated)       Current Barriers:  . Financial Barriers: patient has [place] D, Medicare A/B, and Plan F supplement and reports copay for [name] is cost prohibitive at this time. He notes he is taking the medication once daily to stretch his supply o Endorses that he follows closely w/ cardiologist Kerry Dory, [place] (generally goes to [facility] office). Faxed application to Dr. Lanetta Inch office on 11/26/2018, and have not heard back o Also sees endocrinologist with [place]. They helped him apply for [facility] patient assistance, so he is receiving this medication for free. He wonders about d/c, due to his well controlled A1c  Pharmacist Clinical Goal(s):  Marland Kitchen Over the next 30 days, patient will work with PharmD and providers to relieve medication access concerns  Interventions: . Called Dr. Lanetta Inch office [phone]) at [facility] cardiology, left message for his nurse. Received a return call instructing me to fax to the RN fax machine at [phone] (main fax for cardiology is [phone]); phone for Dr. Lanetta Inch nurse is [phone], ext #[phone]. Faxed application to the office with instruction to fax back to me at [facility].  Patient Self Care Activities:  . Patient will provide necessary portions of application   Please see past updates related to this goal by clicking on the "Past Updates" button in the selected goal         The patient verbalized understanding of instructions provided today and declined a print copy of patient instruction materials.   Plan:  - Will await completed provider portion from Dr. Lanetta Inch office   Catie Darnelle Maffucci, PharmD Clinical Pharmacist [place] [phone]

## 2018-12-08 NOTE — Chronic Care Management (AMB) (Addendum)
Chronic Care Management   Follow Up Note   12/08/2018 Name: Samuel Morris MRN: [id number] DOB: 01-20-1946  Referred by: Valerie Roys, DO Reason for referral : Chronic Care Management (Medication Management)   Samuel Morris is a 73 y.o. year old male who is a primary care patient of Valerie Roys, DO. The CCM team was consulted for assistance with chronic disease management and care coordination needs.    Care coordination completed today.   Review of patient status, including review of consultants reports, relevant laboratory and other test results, and collaboration with appropriate care team members and the patient's provider was performed as part of comprehensive patient evaluation and provision of chronic care management services.    SDOH (Social Determinants of Health) screening performed today: Financial Strain . See Care Plan for related entries.   Advanced Directives Status: N See Care Plan and Vynca application for related entries.  Outpatient Encounter Medications as of 12/08/2018  Medication Sig Note   Ascorbic Acid (VITAMIN C PO) Take 100 mg by mouth daily.    aspirin EC 81 MG tablet Take by mouth.    Calcium-Magnesium 100-50 MG TABS Take by mouth.    Cholecalciferol (VITAMIN D-1000 MAX ST) 25 MCG (1000 UT) tablet Take by mouth.    diclofenac sodium (VOLTAREN) 1 % GEL Apply 4 g topically 4 (four) times daily. (Patient not taking: Reported on 11/26/2018)    ENTRESTO 49-51 MG TK 1 T PO BID 11/26/2018: Only taking once daily    Ertugliflozin L-PyroglutamicAc (STEGLATRO) 5 MG TABS Take by mouth.    ezetimibe (ZETIA) 10 MG tablet TK 1 T PO D    GLUCOSAMINE-CHONDROITIN PO Take 1 tablet by mouth daily.    metoprolol succinate (TOPROL-XL) 100 MG 24 hr tablet TK 1 AND 1/2  TABLETS PO D    nitroGLYCERIN (NITROSTAT) 0.4 MG SL tablet Place 0.4 mg under the tongue every 5 (five) minutes as needed for chest pain.    rosuvastatin (CRESTOR) 20 MG tablet Take 20 mg by mouth  daily.     spironolactone (ALDACTONE) 25 MG tablet TK 1 T PO QD    vitamin B-12 (CYANOCOBALAMIN) 1000 MCG tablet Take by mouth.    No facility-administered encounter medications on file as of 12/08/2018.      Goals Addressed            This Visit's Progress     Patient Stated    "I can't afford my medication" (pt-stated)       Current Barriers:   Financial Barriers: patient has [place] D, Medicare A/B, and Plan F supplement and reports copay for [name] is cost prohibitive at this time. He notes he is taking the medication once daily to stretch his supply o Endorses that he follows closely w/ cardiologist Kerry Dory, [place] (generally goes to [facility] office). Faxed application to Dr. Lanetta Inch office on 11/26/2018, and have not heard back o Also sees endocrinologist with [place]. They helped him apply for [facility] patient assistance, so he is receiving this medication for free. He wonders about d/c, due to his well controlled A1c  Pharmacist Clinical Goal(s):   Over the next 30 days, patient will work with PharmD and providers to relieve medication access concerns  Interventions:  Called Dr. Lanetta Inch office [phone]) at [facility] cardiology, left message for his nurse. Received a return call instructing me to fax to the RN fax machine at [phone] (main fax for cardiology is [phone]);  phone for Dr. Lanetta Inch nurse is [phone], ext [phone]. Faxed application to the office with instruction to fax back to me at [facility].  Patient Self Care Activities:   Patient will provide necessary portions of application   Please see past updates related to this goal by clicking on the "Past Updates" button in the selected goal          Plan:  - Will await completed provider portion from Dr. Lanetta Inch office   Catie Darnelle Maffucci, PharmD Clinical Pharmacist [place] [phone]

## 2018-12-10 ENCOUNTER — Ambulatory Visit: Payer: Self-pay | Admitting: Pharmacist

## 2018-12-10 DIAGNOSIS — I420 Dilated cardiomyopathy: Secondary | ICD-10-CM

## 2018-12-10 NOTE — Chronic Care Management (AMB) (Signed)
Chronic Care Management   Follow Up Note   12/10/2018 Name: Samuel Morris MRN: [id number] DOB: [birth year]  Referred by: Valerie Roys, DO Reason for referral : Chronic Care Management (Medication Management)   Samuel Morris is a 73 y.o. year old male who is a primary care patient of Valerie Roys, DO. The CCM team was consulted for assistance with chronic disease management and care coordination needs.    Care coordination completed today.   Review of patient status, including review of consultants reports, relevant laboratory and other test results, and collaboration with appropriate care team members and the patient's provider was performed as part of comprehensive patient evaluation and provision of chronic care management services.    SDOH (Social Determinants of Health) screening performed today: Financial Strain . See Care Plan for related entries.   Advanced Directives Status: N See Care Plan and Vynca application for related entries.  Outpatient Encounter Medications as of 12/10/2018  Medication Sig Note  . Ascorbic Acid (VITAMIN C PO) Take 100 mg by mouth daily.   Marland Kitchen aspirin EC 81 MG tablet Take by mouth.   . Calcium-Magnesium 100-50 MG TABS Take by mouth.   . Cholecalciferol (VITAMIN D-1000 MAX ST) 25 MCG (1000 UT) tablet Take by mouth.   . diclofenac sodium (VOLTAREN) 1 % GEL Apply 4 g topically 4 (four) times daily. (Patient not taking: Reported on 11/26/2018)   . ENTRESTO 49-51 MG TK 1 T PO BID 11/26/2018: Only taking once daily   . Ertugliflozin L-PyroglutamicAc (STEGLATRO) 5 MG TABS Take by mouth.   . ezetimibe (ZETIA) 10 MG tablet TK 1 T PO D   . GLUCOSAMINE-CHONDROITIN PO Take 1 tablet by mouth daily.   . metoprolol succinate (TOPROL-XL) 100 MG 24 hr tablet TK 1 AND 1/2  TABLETS PO D   . nitroGLYCERIN (NITROSTAT) 0.4 MG SL tablet Place 0.4 mg under the tongue every 5 (five) minutes as needed for chest pain.   . rosuvastatin (CRESTOR) 20 MG tablet Take 20 mg by mouth  daily.    Marland Kitchen spironolactone (ALDACTONE) 25 MG tablet TK 1 T PO QD   . vitamin B-12 (CYANOCOBALAMIN) 1000 MCG tablet Take by mouth.    No facility-administered encounter medications on file as of 12/10/2018.      Goals Addressed            This Visit's Progress     Patient Stated   . "I can't afford my medication" (pt-stated)       Current Barriers:  . Financial Barriers: patient has [place] D, Medicare A/B, and Plan F supplement and reports copay for [name] is cost prohibitive at this time. He notes he is taking the medication once daily to stretch his supply o Endorses that he follows closely w/ cardiologist Kerry Dory, [place] (generally goes to [facility] office). Received provider portion of application today o Also sees endocrinologist with [place]. They helped him apply for [facility] patient assistance, so he is receiving this medication for free. He wonders about d/c, due to his well controlled A1c  Pharmacist Clinical Goal(s):  Marland Kitchen Over the next 30 days, patient will work with PharmD and providers to relieve medication access concerns  Interventions: . Collected patient and provider portions; faxed to [employer] patient assistance. Will follow up with company in 3-5 business days to follow up on status of enrollment  Patient Self Care Activities:  . Patient will provide necessary portions of application   Please see  past updates related to this goal by clicking on the "Past Updates" button in the selected goal          Plan: - Will f/u with company in 3-5 business days  - Will outreach patient within 2-3 weeks for continued medication management  Catie Darnelle Maffucci, PharmD Clinical Pharmacist [place] [phone]

## 2018-12-10 NOTE — Patient Instructions (Signed)
Visit Information  Goals Addressed            This Visit's Progress     Patient Stated   . "I can't afford my medication" (pt-stated)       Current Barriers:  . Financial Barriers: patient has [place] D, Medicare A/B, and Plan F supplement and reports copay for [name] is cost prohibitive at this time. He notes he is taking the medication once daily to stretch his supply o Endorses that he follows closely w/ cardiologist Kerry Dory, [place] (generally goes to [facility] office). Received provider portion of application today o Also sees endocrinologist with [place]. They helped him apply for [facility] patient assistance, so he is receiving this medication for free. He wonders about d/c, due to his well controlled A1c  Pharmacist Clinical Goal(s):  Marland Kitchen Over the next 30 days, patient will work with PharmD and providers to relieve medication access concerns  Interventions: . Collected patient and provider portions; faxed to [employer] patient assistance. Will follow up with company in 3-5 business days to follow up on status of enrollment  Patient Self Care Activities:  . Patient will provide necessary portions of application   Please see past updates related to this goal by clicking on the "Past Updates" button in the selected goal         The patient verbalized understanding of instructions provided today and declined a print copy of patient instruction materials.   Plan: - Will f/u with company in 3-5 business days  - Will outreach patient within 2-3 weeks for continued medication management  Catie Darnelle Maffucci, PharmD Clinical Pharmacist [place] [phone]

## 2018-12-11 ENCOUNTER — Encounter: Payer: Self-pay | Admitting: Family Medicine

## 2018-12-11 ENCOUNTER — Other Ambulatory Visit: Payer: Self-pay

## 2018-12-11 ENCOUNTER — Ambulatory Visit (INDEPENDENT_AMBULATORY_CARE_PROVIDER_SITE_OTHER): Payer: Medicare Other | Admitting: Family Medicine

## 2018-12-11 VITALS — BP 117/70 | HR 61 | Temp 98.5°F | Ht 64.2 in | Wt 173.0 lb

## 2018-12-11 DIAGNOSIS — J0100 Acute maxillary sinusitis, unspecified: Secondary | ICD-10-CM

## 2018-12-11 DIAGNOSIS — I209 Angina pectoris, unspecified: Secondary | ICD-10-CM

## 2018-12-11 MED ORDER — AMOXICILLIN-POT CLAVULANATE 875-125 MG PO TABS: 1.0000 | ORAL_TABLET | Freq: Two times a day (BID) | ORAL | 0 refills | Status: DC

## 2018-12-11 NOTE — Progress Notes (Signed)
BP 117/70   Pulse 61   Temp 98.5 F (36.9 C) (Oral)   Ht 5' 4.2" (1.631 m)   Wt 173 lb (78.5 kg)   SpO2 98%   BMI 29.51 kg/m    Subjective:    Patient ID: Samuel Morris, male    DOB: [birth year], 73 y.o.   MRN: [id number]  HPI: Samuel Morris is a 73 y.o. male  Chief Complaint  Patient presents with  . Cough    x several days. pt states he has been taking some left over amoxicilline 500 mg and OTC mucinex  . Nasal Congestion    This visit was completed via telephone due to the restrictions of the COVID-19 pandemic. All issues as above were discussed and addressed but no physical exam was performed. If it was felt that the patient should be evaluated in the office, they were directed there. The patient verbally consented to this visit. Patient was unable to complete an audio/visual visit due to {Blank single:19197::"Technical difficulties,Lack of internet . Location of the patient: home . Location of the provider: work . Those involved with this call:  . Provider: Merrie Roof, PA-C . CMA: Merilyn Baba, [place] . Front Desk/Registration: Jill Side  . Time spent on call: 15 minutes on the phone discussing health concerns. 5 minutes total spent in review of patient's record and preparation of their chart. I verified patient identity using two factors (patient name and date of birth). Patient consents verbally to being seen via telemedicine visit today.   Several days of sneezing, rhinorrhea, now worsening into sinus pain and pressure, congestion, headaches. Started some leftover amoxil since yesterday. Has also been taking some mucinex which seems to help. Denies fever, chills, cough, CP, SOB, sick contacts, recent travels. States he gets these infections about once a year.   Relevant past medical, surgical, family and social history reviewed and updated as indicated. Interim medical history since our last visit reviewed. Allergies and medications reviewed and updated.  Review of  Systems  Per HPI unless specifically indicated above     Objective:    BP 117/70   Pulse 61   Temp 98.5 F (36.9 C) (Oral)   Ht 5' 4.2" (1.631 m)   Wt 173 lb (78.5 kg)   SpO2 98%   BMI 29.51 kg/m   Wt Readings from Last 3 Encounters:  12/11/18 173 lb (78.5 kg)  11/06/18 173 lb 4 oz (78.6 kg)  11/05/18 173 lb (78.5 kg)    Physical Exam  Unable to perform PE due to patient lack of access to video technology for today's visit  Results for orders placed or performed in visit on 11/06/18  CBC with Differential/Platelet  Result Value Ref Range   WBC 6.9 3.4 - 10.8 x10E3/uL   RBC 5.54 4.14 - 5.80 x10E6/uL   Hemoglobin 15.8 13.0 - 17.7 g/dL   Hematocrit 47.8 37.5 - 51.0 %   MCV 86 79 - 97 fL   MCH 28.5 26.6 - 33.0 pg   MCHC 33.1 31.5 - 35.7 g/dL   RDW 13.0 11.6 - 15.4 %   Platelets 204 150 - 450 x10E3/uL   Neutrophils 61 Not Estab. %   Lymphs 21 Not Estab. %   Monocytes 12 Not Estab. %   Eos 5 Not Estab. %   Basos 1 Not Estab. %   Neutrophils Absolute 4.3 1.4 - 7.0 x10E3/uL   Lymphocytes Absolute 1.5 0.7 - 3.1 x10E3/uL   Monocytes Absolute 0.8 0.1 - 0.9  x10E3/uL   EOS (ABSOLUTE) 0.3 0.0 - 0.4 x10E3/uL   Basophils Absolute 0.0 0.0 - 0.2 x10E3/uL   Immature Granulocytes 0 Not Estab. %   Immature Grans (Abs) 0.0 0.0 - 0.1 x10E3/uL  Bayer DCA Hb A1c Waived  Result Value Ref Range   HB A1C (BAYER DCA - WAIVED) 5.8 <7.0 %  Comprehensive metabolic panel  Result Value Ref Range   Glucose 100 (H) 65 - 99 mg/dL   BUN 12 8 - 27 mg/dL   Creatinine, Ser 0.76 0.76 - 1.27 mg/dL   GFR calc non Af Amer 90 >59 mL/min/1.73   GFR calc Af Amer 104 >59 mL/min/1.73   BUN/Creatinine Ratio 16 10 - 24   Sodium 139 134 - 144 mmol/L   Potassium 4.6 3.5 - 5.2 mmol/L   Chloride 102 96 - 106 mmol/L   CO2 22 20 - 29 mmol/L   Calcium 9.3 8.6 - 10.2 mg/dL   Total Protein 6.6 6.0 - 8.5 g/dL   Albumin 4.6 3.7 - 4.7 g/dL   Globulin, Total 2.0 1.5 - 4.5 g/dL   Albumin/Globulin Ratio 2.3 (H) 1.2  - 2.2   Bilirubin Total 0.7 0.0 - 1.2 mg/dL   Alkaline Phosphatase 103 39 - 117 IU/L   AST 14 0 - 40 IU/L   ALT 12 0 - 44 IU/L  Lipid Panel w/o Chol/HDL Ratio  Result Value Ref Range   Cholesterol, Total 125 100 - 199 mg/dL   Triglycerides 82 0 - 149 mg/dL   HDL 51 >39 mg/dL   VLDL Cholesterol Cal 16 5 - 40 mg/dL   LDL Calculated 58 0 - 99 mg/dL  Microalbumin, Urine Waived  Result Value Ref Range   Microalb, Ur Waived 10 0 - 19 mg/L   Creatinine, Urine Waived 50 10 - 300 mg/dL   Microalb/Creat Ratio 30-300 (H) <30 mg/g  PSA  Result Value Ref Range   Prostate Specific Ag, Serum 2.9 0.0 - 4.0 ng/mL  TSH  Result Value Ref Range   TSH 3.240 0.450 - 4.500 uIU/mL  UA/M w/rflx Culture, Routine   Specimen: Blood   BLD  Result Value Ref Range   Specific Gravity, UA 1.015 1.005 - 1.030   pH, UA 5.0 5.0 - 7.5   Color, UA Yellow Yellow   Appearance Ur Clear Clear   Leukocytes,UA Negative Negative   Protein,UA Negative Negative/Trace   Glucose, UA 2+ (A) Negative   Ketones, UA Negative Negative   RBC, UA Negative Negative   Bilirubin, UA Negative Negative   Urobilinogen, Ur 0.2 0.2 - 1.0 mg/dL   Nitrite, UA Negative Negative  Hepatitis C Antibody  Result Value Ref Range   Hep C Virus Ab <0.1 0.0 - 0.9 s/co ratio      Assessment & Plan:   Problem List Items Addressed This Visit    None    Visit Diagnoses    Acute maxillary sinusitis, recurrence not specified    -  Primary   Tx with augmentin, mucinex, sinus rinses, flonase. Discussed viral vs bacterial and early care of sinus sxs prior to initiating abx for future illnesses   Relevant Medications   amoxicillin-clavulanate (AUGMENTIN) 875-125 MG tablet       Follow up plan: Return if symptoms worsen or fail to improve.

## 2018-12-15 ENCOUNTER — Ambulatory Visit: Payer: Self-pay | Admitting: Pharmacist

## 2018-12-15 DIAGNOSIS — I420 Dilated cardiomyopathy: Secondary | ICD-10-CM

## 2018-12-15 NOTE — Chronic Care Management (AMB) (Signed)
Chronic Care Management   Follow Up Note   12/15/2018 Name: Samuel Morris MRN: [id number] DOB: 1945-12-22  Referred by: Valerie Roys, DO Reason for referral : Chronic Care Management (Medication Management)   Samuel Morris is a 73 y.o. year old male who is a primary care patient of Valerie Roys, DO. The CCM team was consulted for assistance with chronic disease management and care coordination needs.    Care coordination completed today.   Review of patient status, including review of consultants reports, relevant laboratory and other test results, and collaboration with appropriate care team members and the patient's provider was performed as part of comprehensive patient evaluation and provision of chronic care management services.    SDOH (Social Determinants of Health) screening performed today: Financial Strain . See Care Plan for related entries.   Advanced Directives Status: N See Care Plan and [employer] application for related entries.  Outpatient Encounter Medications as of 12/15/2018  Medication Sig Note  . amoxicillin-clavulanate (AUGMENTIN) 875-125 MG tablet Take 1 tablet by mouth 2 (two) times daily.   . Ascorbic Acid (VITAMIN C PO) Take 100 mg by mouth daily.   Marland Kitchen aspirin EC 81 MG tablet Take by mouth.   . Calcium-Magnesium 100-50 MG TABS Take by mouth.   . Cholecalciferol (VITAMIN D-1000 MAX ST) 25 MCG (1000 UT) tablet Take by mouth.   . diclofenac sodium (VOLTAREN) 1 % GEL Apply 4 g topically 4 (four) times daily. (Patient not taking: Reported on 12/11/2018)   . ENTRESTO 49-51 MG TK 1 T PO BID 11/26/2018: Only taking once daily   . Ertugliflozin L-PyroglutamicAc (STEGLATRO) 5 MG TABS Take by mouth.   . ezetimibe (ZETIA) 10 MG tablet TK 1 T PO D   . GLUCOSAMINE-CHONDROITIN PO Take 1 tablet by mouth daily.   . metoprolol succinate (TOPROL-XL) 100 MG 24 hr tablet TK 1 AND 1/2  TABLETS PO D   . nitroGLYCERIN (NITROSTAT) 0.4 MG SL tablet Place 0.4 mg under the tongue every 5  (five) minutes as needed for chest pain.   . rosuvastatin (CRESTOR) 20 MG tablet Take 20 mg by mouth daily.    Marland Kitchen spironolactone (ALDACTONE) 25 MG tablet TK 1 T PO QD   . vitamin B-12 (CYANOCOBALAMIN) 1000 MCG tablet Take by mouth.    No facility-administered encounter medications on file as of 12/15/2018.      Goals Addressed            This Visit's Progress     Patient Stated   . "I can't afford my medication" (pt-stated)       Current Barriers:  . Financial Barriers: patient has [place] [employer], Medicare A/B, and Plan F supplement and reports copay for [name] is cost prohibitive at this time. He notes he is taking the medication once daily to stretch his supply o Cardiology: Dr. Kerry Dory, [facility] Applied for [employer] assistance o Endocrinology: worked w/ patient for [employer] patient Secretary/administrator):  Marland Kitchen Over the next 30 days, patient will work with PharmD and providers to relieve medication access concerns  Interventions: . Contacted [employer] patient assistance. Patient was APPROVED to receive Entresto from the company through 03/12/2019. They noted the medication has been processed today to be delivered to the patient on Thursday.  Marland Kitchen Contacted patient, informed him of the above. He noted that he received a call from the company today with the above information as well. He expressed appreciation.  Patient Self Care Activities:  .  Patient will provide necessary portions of application   Please see past updates related to this goal by clicking on the "Past Updates" button in the selected goal         Plan: - Will outreach patient in ~2 weeks to ensure he received the medication and is taking appropriately.   Catie Darnelle Maffucci, PharmD Clinical Pharmacist [place] [phone]

## 2018-12-15 NOTE — Patient Instructions (Signed)
Visit Information  Goals Addressed            This Visit's Progress     Patient Stated   . "I can't afford my medication" (pt-stated)       Current Barriers:  . Financial Barriers: patient has [place] [employer], Medicare A/B, and Plan F supplement and reports copay for [name] is cost prohibitive at this time. He notes he is taking the medication once daily to stretch his supply o Cardiology: Dr. Kerry Dory, [facility]. Applied for [employer] assistance o Endocrinology: worked w/ patient for [employer] patient Secretary/administrator):  Marland Kitchen Over the next 30 days, patient will work with PharmD and providers to relieve medication access concerns  Interventions: . Contacted [employer] patient assistance. Patient was APPROVED to receive Entresto from the company through 03/12/2019. They noted the medication has been processed today to be delivered to the patient on Thursday.  Marland Kitchen Contacted patient, informed him of the above. He noted that he received a call from the company today with the above information as well. He expressed appreciation.  Patient Self Care Activities:  . Patient will provide necessary portions of application   Please see past updates related to this goal by clicking on the "Past Updates" button in the selected goal         The patient verbalized understanding of instructions provided today and declined a print copy of patient instruction materials.   Plan: - Will outreach patient in ~2 weeks to ensure he received the medication and is taking appropriately.   Catie Darnelle Maffucci, PharmD Clinical Pharmacist [place] [phone]

## 2018-12-26 ENCOUNTER — Ambulatory Visit (INDEPENDENT_AMBULATORY_CARE_PROVIDER_SITE_OTHER): Payer: Medicare Other | Admitting: Pharmacist

## 2018-12-26 DIAGNOSIS — E785 Hyperlipidemia, unspecified: Secondary | ICD-10-CM

## 2018-12-26 DIAGNOSIS — E1169 Type 2 diabetes mellitus with other specified complication: Secondary | ICD-10-CM

## 2018-12-26 DIAGNOSIS — I420 Dilated cardiomyopathy: Secondary | ICD-10-CM

## 2018-12-26 NOTE — Patient Instructions (Signed)
Visit Information  Goals Addressed            This Visit's Progress     Patient Stated   . "I can't afford my medication" (pt-stated)       Current Barriers:  . Financial Barriers: resolved; patient approved for [facility] assistance, and is now taking twice daily as prescribed. Reports BP in the 120s/70s . Polypharmacy; complex patient with multiple comorbidities including CAD hx STEMI, aortic stenosis hx valve replacement, CHF, ICD, HLD, T2DM o CAD/HLD: Rosuvastatin 20 mg daily, ezetimibe 10 mg daily; patient reports "muscle cramps". He and his wife are interested in stopping statin therapy because their daughter recently had her simvastatin stopped and switched to rosuvastatin. Reports cramps in legs at night. Notes he has been on rosuvastatin for many years. Last LDL at goal <70 o CHF: Entresto 49/51 mg BID, metoprolol succinate 150 mg daily, spironolactone 25 mg daily o T2DM: Steglatro 5 mg daily  Pharmacist Clinical Goal(s):  Marland Kitchen Over the next 90 days, patient will work with PharmD and providers for optimized medication management  Interventions: . Discussed typical statin associated symptoms (SAMS), and that are are concerned about muscle pain, not cramps. Discussed that SAMS generally occur with medication initiation or dose increase; generally a constant pain, not cramps occurring at night. Encouraged patient to remain well hydrated to treat cramps. Discussed principals of hydrophilicity vs lipophilicity of statin medications. Patient in agreement to continue statin therapy. He will consider discussing d/c of ezetimibe w/ cardiology. Discussed that d/t his cardiac hx, stringent LDL reduction is desired. He verbalized understanding.     Patient Self Care Activities:  . Patient will take medications as prescribed.   Please see past updates related to this goal by clicking on the "Past Updates" button in the selected goal         The patient verbalized understanding of instructions  provided today and declined a print copy of patient instruction materials.   Plan:  - Will outreach patient in the next 4-6 weeks for continued medication management support  Catie Darnelle Maffucci, PharmD Clinical Pharmacist [place] [phone]

## 2018-12-26 NOTE — Chronic Care Management (AMB) (Signed)
Chronic Care Management   Follow Up Note   12/26/2018 Name: Samuel Morris MRN: [id number] DOB: November 18, 1945  Referred by: Valerie Roys, DO Reason for referral : Chronic Care Management (Medication Management)   Samuel Morris is a 73 y.o. year old male who is a primary care patient of Valerie Roys, DO. The CCM team was consulted for assistance with chronic disease management and care coordination needs.    Contacted patient for medication management review today.   Review of patient status, including review of consultants reports, relevant laboratory and other test results, and collaboration with appropriate care team members and the patient's provider was performed as part of comprehensive patient evaluation and provision of chronic care management services.    SDOH (Social Determinants of Health) screening performed today: Financial Strain  None. See Care Plan for related entries.   Advanced Directives Status: N See Care Plan and Vynca application for related entries.  Outpatient Encounter Medications as of 12/26/2018  Medication Sig  . Ascorbic Acid (VITAMIN C PO) Take 100 mg by mouth daily.  Marland Kitchen aspirin EC 81 MG tablet Take by mouth.  . ENTRESTO 49-51 MG TK 1 T PO BID  . Ertugliflozin L-PyroglutamicAc (STEGLATRO) 5 MG TABS Take by mouth.  . ezetimibe (ZETIA) 10 MG tablet TK 1 T PO D  . metoprolol succinate (TOPROL-XL) 100 MG 24 hr tablet TK 1 AND 1/2  TABLETS PO D  . rosuvastatin (CRESTOR) 20 MG tablet Take 20 mg by mouth daily.   Marland Kitchen spironolactone (ALDACTONE) 25 MG tablet TK 1 T PO QD  . Calcium-Magnesium 100-50 MG TABS Take by mouth.  . Cholecalciferol (VITAMIN D-1000 MAX ST) 25 MCG (1000 UT) tablet Take by mouth.  . diclofenac sodium (VOLTAREN) 1 % GEL Apply 4 g topically 4 (four) times daily. (Patient not taking: Reported on 12/11/2018)  . GLUCOSAMINE-CHONDROITIN PO Take 1 tablet by mouth daily.  . nitroGLYCERIN (NITROSTAT) 0.4 MG SL tablet Place 0.4 mg under the tongue every  5 (five) minutes as needed for chest pain.  . vitamin B-12 (CYANOCOBALAMIN) 1000 MCG tablet Take by mouth.  . [DISCONTINUED] amoxicillin-clavulanate (AUGMENTIN) 875-125 MG tablet Take 1 tablet by mouth 2 (two) times daily.   No facility-administered encounter medications on file as of 12/26/2018.      Goals Addressed            This Visit's Progress     Patient Stated   . "I can't afford my medication" (pt-stated)       Current Barriers:  . Financial Barriers: resolved; patient approved for [facility] assistance, and is now taking twice daily as prescribed. Reports BP in the 120s/70s . Polypharmacy; complex patient with multiple comorbidities including CAD hx STEMI, aortic stenosis hx valve replacement, CHF, ICD, HLD, T2DM o CAD/HLD: Rosuvastatin 20 mg daily, ezetimibe 10 mg daily; patient reports "muscle cramps". He and his wife are interested in stopping statin therapy because their daughter recently had her simvastatin stopped and switched to rosuvastatin. Reports cramps in legs at night. Notes he has been on rosuvastatin for many years. Last LDL at goal <70 o CHF: Entresto 49/51 mg BID, metoprolol succinate 150 mg daily, spironolactone 25 mg daily o T2DM: Steglatro 5 mg daily  Pharmacist Clinical Goal(s):  Marland Kitchen Over the next 90 days, patient will work with PharmD and providers for optimized medication management  Interventions: . Discussed typical statin associated symptoms (SAMS), and that are are concerned about muscle pain, not cramps. Discussed that SAMS generally occur with  medication initiation or dose increase; generally a constant pain, not cramps occurring at night. Encouraged patient to remain well hydrated to treat cramps. Discussed principals of hydrophilicity vs lipophilicity of statin medications. Patient in agreement to continue statin therapy. He will consider discussing d/c of ezetimibe w/ cardiology. Discussed that d/t his cardiac hx, stringent LDL reduction is desired. He  verbalized understanding.     Patient Self Care Activities:  . Patient will take medications as prescribed.   Please see past updates related to this goal by clicking on the "Past Updates" button in the selected goal          Plan:  - Will outreach patient in the next 4-6 weeks for continued medication management support  Catie Darnelle Maffucci, PharmD Clinical Pharmacist [place] [phone]

## 2019-01-09 ENCOUNTER — Ambulatory Visit: Payer: Medicare Other | Admitting: Family Medicine

## 2019-01-27 ENCOUNTER — Ambulatory Visit: Payer: Medicare Other | Admitting: Pharmacist

## 2019-01-27 DIAGNOSIS — E1169 Type 2 diabetes mellitus with other specified complication: Secondary | ICD-10-CM

## 2019-01-27 DIAGNOSIS — I420 Dilated cardiomyopathy: Secondary | ICD-10-CM

## 2019-01-27 DIAGNOSIS — E785 Hyperlipidemia, unspecified: Secondary | ICD-10-CM

## 2019-01-27 NOTE — Chronic Care Management (AMB) (Signed)
Chronic Care Management   Follow Up Note   01/27/2019 Name: Samuel Morris MRN: [id number] DOB: 02/25/1946  Referred by: Valerie Roys, DO Reason for referral : Chronic Care Management (Medication Management)   Samuel Morris is a 73 y.o. year old male who is a primary care patient of Valerie Roys, DO. The CCM team was consulted for assistance with chronic disease management and care coordination needs.    Contacted patient for medication management review.   Review of patient status, including review of consultants reports, relevant laboratory and other test results, and collaboration with appropriate care team members and the patient's provider was performed as part of comprehensive patient evaluation and provision of chronic care management services.    SDOH (Social Determinants of Health) screening performed today: Financial Strain . See Care Plan for related entries.   Outpatient Encounter Medications as of 01/27/2019  Medication Sig  . Ascorbic Acid (VITAMIN C PO) Take 100 mg by mouth daily.  Marland Kitchen ENTRESTO 49-51 MG TK 1 T PO BID  . Ertugliflozin L-PyroglutamicAc (STEGLATRO) 5 MG TABS Take 5 mg by mouth daily.   Marland Kitchen ezetimibe (ZETIA) 10 MG tablet TK 1 T PO D  . metoprolol succinate (TOPROL-XL) 100 MG 24 hr tablet TK 1 AND 1/2  TABLETS PO D  . pravastatin (PRAVACHOL) 20 MG tablet Take 20 mg by mouth daily.  Marland Kitchen spironolactone (ALDACTONE) 25 MG tablet TK 1 T PO QD  . vitamin B-12 (CYANOCOBALAMIN) 1000 MCG tablet Take by mouth.  Marland Kitchen aspirin EC 81 MG tablet Take by mouth.  . Calcium-Magnesium 100-50 MG TABS Take by mouth.  . Cholecalciferol (VITAMIN D-1000 MAX ST) 25 MCG (1000 UT) tablet Take by mouth.  . diclofenac sodium (VOLTAREN) 1 % GEL Apply 4 g topically 4 (four) times daily. (Patient not taking: Reported on 12/11/2018)  . GLUCOSAMINE-CHONDROITIN PO Take 1 tablet by mouth daily.  . nitroGLYCERIN (NITROSTAT) 0.4 MG SL tablet Place 0.4 mg under the tongue every 5 (five) minutes as  needed for chest pain.  . [DISCONTINUED] rosuvastatin (CRESTOR) 20 MG tablet Take 20 mg by mouth daily.    No facility-administered encounter medications on file as of 01/27/2019.      Goals Addressed            This Visit's Progress     Patient Stated   . "I can't afford my medication" (pt-stated)       Current Barriers:  . Financial Barriers: resolved; patient approved for [facility] assistance, and is now taking twice daily as prescribed.  . Will need to apply for 2021 [facility] assistance through [employer]  . Polypharmacy; complex patient with multiple comorbidities including CAD hx STEMI, aortic stenosis hx valve replacement, CHF, ICD, HLD, T2DM o CAD/HLD: notes that his cardiologist recently switched from rosuvastatin to pravastatin d/t reports of muscle pains o CHF: Entresto 49/51 mg BID, metoprolol succinate 150 mg daily, spironolactone 25 mg daily o T2DM: Steglatro 5 mg daily  Pharmacist Clinical Goal(s):  Marland Kitchen Over the next 90 days, patient will work with PharmD and providers for optimized medication management  Interventions: . Discussed reduced likelihood of muscle aches w/ pravastatin w/ other lipophilic medications. Discussed that moderate intensity vs high intensity rosuvastatin.  . Will investigate [employer] 2021 plans for PAP application and touch base with patient next month on this  Patient Self Care Activities:  . Patient will take medications as prescribed.   Please see past updates related to this goal by clicking on the "Past Updates"  button in the selected goal          Plan: - Will outreach patient in ~4-5 weeks for continued medication management  Catie Darnelle Maffucci, PharmD Clinical Pharmacist [place] [phone]

## 2019-01-27 NOTE — Patient Instructions (Signed)
Visit Information  Goals Addressed            This Visit's Progress     Patient Stated   . "I can't afford my medication" (pt-stated)       Current Barriers:  . Financial Barriers: resolved; patient approved for [facility] assistance, and is now taking twice daily as prescribed.  . Will need to apply for 2021 [facility] assistance through [employer]  . Polypharmacy; complex patient with multiple comorbidities including CAD hx STEMI, aortic stenosis hx valve replacement, CHF, ICD, HLD, T2DM o CAD/HLD: notes that his cardiologist recently switched from rosuvastatin to pravastatin d/t reports of muscle pains o CHF: Entresto 49/51 mg BID, metoprolol succinate 150 mg daily, spironolactone 25 mg daily o T2DM: Steglatro 5 mg daily  Pharmacist Clinical Goal(s):  Marland Kitchen Over the next 90 days, patient will work with PharmD and providers for optimized medication management  Interventions: . Discussed reduced likelihood of muscle aches w/ pravastatin w/ other lipophilic medications. Discussed that moderate intensity vs high intensity rosuvastatin.  . Will investigate [employer] 2021 plans for PAP application and touch base with patient next month on this  Patient Self Care Activities:  . Patient will take medications as prescribed.   Please see past updates related to this goal by clicking on the "Past Updates" button in the selected goal         The patient verbalized understanding of instructions provided today and declined a print copy of patient instruction materials.   Plan: - Will outreach patient in ~4-5 weeks for continued medication management  Catie Darnelle Maffucci, PharmD Clinical Pharmacist [place] [phone]

## 2019-03-04 ENCOUNTER — Ambulatory Visit (INDEPENDENT_AMBULATORY_CARE_PROVIDER_SITE_OTHER): Payer: Medicare Other | Admitting: Pharmacist

## 2019-03-04 ENCOUNTER — Encounter: Payer: Self-pay | Admitting: Pharmacist

## 2019-03-04 DIAGNOSIS — E785 Hyperlipidemia, unspecified: Secondary | ICD-10-CM | POA: Diagnosis not present

## 2019-03-04 DIAGNOSIS — E1169 Type 2 diabetes mellitus with other specified complication: Secondary | ICD-10-CM | POA: Diagnosis not present

## 2019-03-04 DIAGNOSIS — I420 Dilated cardiomyopathy: Secondary | ICD-10-CM

## 2019-03-04 NOTE — Patient Instructions (Signed)
Visit Information  Goals Addressed            This Visit's Progress     Patient Stated   . "I can't afford my medication" (pt-stated)       Current Barriers:  . Financial Barriers: resolved; patient approved for [facility] assistance, and is now taking twice daily as prescribed. . Polypharmacy; complex patient with multiple comorbidities including CAD hx STEMI, aortic stenosis hx valve replacement, CHF, ICD, HLD, T2DM o CAD/HLD: pravastatin 40 mg daily, ezetimibe 10 mg daily; notes he needs a refill on ezetimibe, and has called and sent a MyChart with this request to Dr. Alva Garnet o CHF: [name] 49/51 mg BID, metoprolol succinate 150 mg daily, spironolactone 25 mg daily; notes his prescription for metoprolol is due for a refill - Receiving Entresto from [employer] Patient Assistance through 03/12/2019 o T2DM: Farxiga 5 mg daily - notes no cost concerns with this medication at this time;   Pharmacist Clinical Goal(s):  Marland Kitchen Over the next 90 days, patient will work with PharmD and providers for optimized medication management  Interventions: . Reviewed 2021 [employer] patient assistance application needs. Sending patient his portion of application to complete and send back to me (requested I send via MyChart).  . Called Dr. Lanetta Inch office ([place] phone [phone]; fax [phone]). Left voicemail for RN Daleen Snook that I would be faxing this to the office for Dr. Lanetta Inch signature, and to fax back to me at [facility]. Also left patient's request for an ezetimibe and metoprolol refill.  . Faxed prescriber portion to Dr. Lanetta Inch office.   Patient Self Care Activities:  . Patient will take medications as prescribed.   Please see past updates related to this goal by clicking on the "Past Updates" button in the selected goal         The patient verbalized understanding of instructions provided today and declined a print copy of patient instruction materials.    Plan: - Scheduled follow up phone call 04/21/19 at 9:30 am   Catie Darnelle Maffucci, PharmD, [place] [phone]

## 2019-03-04 NOTE — Chronic Care Management (AMB) (Signed)
Chronic Care Management   Follow Up Note   03/04/2019 Name: Samuel Morris MRN: [id number] DOB: March 24, 1945  Referred by: Valerie Roys, DO Reason for referral : Chronic Care Management (Medication Management)   Samuel Morris is a 73 y.o. year old male who is a primary care patient of Valerie Roys, DO. The CCM team was consulted for assistance with chronic disease management and care coordination needs.    Contacted patient for medication management review.   Review of patient status, including review of consultants reports, relevant laboratory and other test results, and collaboration with appropriate care team members and the patient's provider was performed as part of comprehensive patient evaluation and provision of chronic care management services.    SDOH (Social Determinants of Health) screening performed today: Financial Strain . See Care Plan for related entries.   Outpatient Encounter Medications as of 03/04/2019  Medication Sig  . Ascorbic Acid (VITAMIN C PO) Take 100 mg by mouth daily.  Marland Kitchen aspirin EC 81 MG tablet Take by mouth.  . Calcium-Magnesium 100-50 MG TABS Take by mouth.  . Cholecalciferol (VITAMIN D-1000 MAX ST) 25 MCG (1000 UT) tablet Take by mouth.  . diclofenac sodium (VOLTAREN) 1 % GEL Apply 4 g topically 4 (four) times daily. (Patient not taking: Reported on 12/11/2018)  . ENTRESTO 49-51 MG TK 1 T PO BID  . Ertugliflozin L-PyroglutamicAc (STEGLATRO) 5 MG TABS Take 5 mg by mouth daily.   Marland Kitchen ezetimibe (ZETIA) 10 MG tablet TK 1 T PO D  . FARXIGA 5 MG TABS tablet Take 5 mg by mouth daily.  Marland Kitchen GLUCOSAMINE-CHONDROITIN PO Take 1 tablet by mouth daily.  . metoprolol succinate (TOPROL-XL) 100 MG 24 hr tablet TK 1 AND 1/2  TABLETS PO D  . nitroGLYCERIN (NITROSTAT) 0.4 MG SL tablet Place 0.4 mg under the tongue every 5 (five) minutes as needed for chest pain.  . pravastatin (PRAVACHOL) 20 MG tablet Take 20 mg by mouth daily.  Marland Kitchen spironolactone (ALDACTONE) 25 MG tablet TK  1 T PO QD  . vitamin B-12 (CYANOCOBALAMIN) 1000 MCG tablet Take by mouth.   No facility-administered encounter medications on file as of 03/04/2019.     Goals Addressed            This Visit's Progress     Patient Stated   . "I can't afford my medication" (pt-stated)       Current Barriers:  . Financial Barriers: resolved; patient approved for [facility] assistance, and is now taking twice daily as prescribed. . Polypharmacy; complex patient with multiple comorbidities including CAD hx STEMI, aortic stenosis hx valve replacement, CHF, ICD, HLD, T2DM o CAD/HLD: pravastatin 40 mg daily, ezetimibe 10 mg daily; notes he needs a refill on ezetimibe, and has called and sent a MyChart with this request to Dr. Alva Garnet o CHF: [name] 49/51 mg BID, metoprolol succinate 150 mg daily, spironolactone 25 mg daily; notes his prescription for metoprolol is due for a refill - Receiving Entresto from [employer] Patient Assistance through 03/12/2019 o T2DM: Farxiga 5 mg daily - notes no cost concerns with this medication at this time;   Pharmacist Clinical Goal(s):  Marland Kitchen Over the next 90 days, patient will work with PharmD and providers for optimized medication management  Interventions: . Reviewed 2021 [employer] patient assistance application needs. Sending patient his portion of application to complete and send back to me (requested I send via MyChart).  . Called Dr. Lanetta Inch office ([place] phone [phone]; fax [phone]). Left  voicemail for RN Daleen Snook that I would be faxing this to the office for Dr. Lanetta Inch signature, and to fax back to me at [facility]. Also left patient's request for an ezetimibe and metoprolol refill.  . Faxed prescriber portion to Dr. Lanetta Inch office.   Patient Self Care Activities:  . Patient will take medications as prescribed.   Please see past updates related to this goal by clicking on the "Past Updates" button in the selected goal            Plan: - Scheduled follow up phone call 04/21/19 at 9:30 am   Catie Darnelle Maffucci, PharmD, [place] [phone]

## 2019-03-11 ENCOUNTER — Ambulatory Visit: Payer: Self-pay | Admitting: Pharmacist

## 2019-03-11 ENCOUNTER — Other Ambulatory Visit: Payer: Self-pay | Admitting: Family Medicine

## 2019-03-11 DIAGNOSIS — I420 Dilated cardiomyopathy: Secondary | ICD-10-CM

## 2019-03-11 NOTE — Telephone Encounter (Signed)
Last seen 11/06/18 and has appointment 05/07/18.

## 2019-03-11 NOTE — Patient Instructions (Signed)
Visit Information  Goals Addressed            This Visit's Progress     Patient Stated   . "I can't afford my medication" (pt-stated)       Current Barriers:  . Financial Barriers: resolved; patient approved for [facility] assistance, and is now taking twice daily as prescribed. . Polypharmacy; complex patient with multiple comorbidities including CAD hx STEMI, aortic stenosis hx valve replacement, CHF, ICD, HLD, T2DM o CAD/HLD: pravastatin 40 mg daily, ezetimibe 10 mg daily;  o CHF: Entresto 49/51 mg BID, metoprolol succinate 150 mg daily, spironolactone 25 mg daily; notes his prescription for metoprolol is due for a refill - Receiving Entresto from [employer] Patient Assistance through 03/12/2019. Faxed form to Dr. Alva Garnet at [facility] on 03/04/2019.  o T2DM: Farxiga 5 mg daily   Pharmacist Clinical Goal(s):  Marland Kitchen Over the next 90 days, patient will work with PharmD and providers for optimized medication management  Interventions: . Received voicemail from [place], [place] w/ Dr. Alva Garnet, with questions about the [facility] application. Called Daleen Snook back, left voicemail for her to return my call at her convenience  Patient Self Care Activities:  . Patient will take medications as prescribed.   Please see past updates related to this goal by clicking on the "Past Updates" button in the selected goal         The patient verbalized understanding of instructions provided today and declined a print copy of patient instruction materials.   Plan: - Will await call back from Dr. Lanetta Inch office. If I have not heard back, will follow up next week  Catie Darnelle Maffucci, PharmD, [place] [phone]

## 2019-03-11 NOTE — Telephone Encounter (Signed)
Requested medication (s) are due for refill today: yes  Requested medication (s) are on the active medication list:  yes  Last refill:  11/12/2018  Future visit scheduled: yes  Notes to clinic:  medications last filled by historical provider Review for refill   Requested Prescriptions  Pending Prescriptions Disp Refills   metoprolol succinate (TOPROL-XL) 100 MG 24 hr tablet [Pharmacy Med Name: METOPROLOL ER SUCCINATE 100MG  TABS] 135 tablet     Sig: TAKE 1 AND 1/2 TABLETS BY MOUTH DAILY      Cardiovascular:  Beta Blockers Passed - 03/11/2019  2:10 PM      Passed - Last BP in normal range    BP Readings from Last 1 Encounters:  12/11/18 117/70          Passed - Last Heart Rate in normal range    Pulse Readings from Last 1 Encounters:  12/11/18 61          Passed - Valid encounter within last 6 months    Recent Outpatient Visits           3 months ago Acute maxillary sinusitis, recurrence not specified   [facility] Volney American, PA-C   4 months ago Essential hypertension   [place], [place] P, DO   4 months ago Dilated cardiomyopathy ([place])   [place], [place], DO       Future Appointments             In 3 weeks  [place], [facility]   In 1 month [place], Megan P, DO [place], [facility]              ezetimibe (ZETIA) 10 MG tablet [employer] Med Name: EZETIMIBE 10MG  TABLETS] 90 tablet     Sig: TAKE 1 TABLET BY MOUTH DAILY      Cardiovascular:  Antilipid - Sterol Transport Inhibitors Passed - 03/11/2019  2:10 PM      Passed - Total Cholesterol in normal range and within 360 days    Cholesterol, Total  Date Value Ref Range Status  11/06/2018 125 100 - 199 mg/dL Final          Passed - LDL in normal range and within 360 days    LDL Calculated  Date Value Ref Range Status  11/06/2018 58 0 - 99 mg/dL Final    Comment:    **Effective November 10, 2018, [facility] is  implementing an improved** equation to calculate Low Density Lipoprotein Cholesterol (LDL-C) concentrations, to be used in all lipid panels that report calculated LDL-C. This equation was developed through a collaboration with the [employer], Lung and [place] (NIH).[1] The NIH calculation overcomes the limitations of the existing Friedewald LDL-C equation and performs equally well in both fasting and non-fasting individuals. 1. Pauline Good Q, et al. A new equation for calculation of low-density lipoprotein cholesterol in patients with normolipidemia and/or hypertriglyceridemia. JAMA Cardiol. 2020 Feb 26. doi:10.1001/jamacardio.2020.0013           Passed - HDL in normal range and within 360 days    HDL  Date Value Ref Range Status  11/06/2018 51 >39 mg/dL Final          Passed - Triglycerides in normal range and within 360 days    Triglycerides  Date Value Ref Range Status  11/06/2018 82 0 - 149 mg/dL Final          Passed - Valid  encounter within last 12 months    Recent Outpatient Visits           3 months ago Acute maxillary sinusitis, recurrence not specified   [facility] Merrie Roof [place], [place]   4 months ago Essential hypertension   [place], [place], DO   4 months ago Dilated cardiomyopathy [facility])   [place], Megan P, DO       Future Appointments             In 3 weeks  [place], [place]   In 1 month [place], Megan P, DO [employer], [facility]

## 2019-03-11 NOTE — Chronic Care Management (AMB) (Signed)
Chronic Care Management   Follow Up Note   03/11/2019 Name: Samuel Morris MRN: [id number] DOB: 02-24-1946  Referred by: Samuel Roys, DO Reason for referral : Chronic Care Management (Medication Management)   Samuel Morris is a 73 y.o. year old male who is a primary care patient of Samuel Roys, DO. The CCM team was consulted for assistance with chronic disease management and care coordination needs.    Contacted patient for medication management support.  Review of patient status, including review of consultants reports, relevant laboratory and other test results, and collaboration with appropriate care team members and the patient's provider was performed as part of comprehensive patient evaluation and provision of chronic care management services.    SDOH (Social Determinants of Health) screening performed today: Financial Strain . See Care Plan for related entries.   Outpatient Encounter Medications as of 03/11/2019  Medication Sig  . Ascorbic Acid (VITAMIN C PO) Take 100 mg by mouth daily.  Marland Kitchen aspirin EC 81 MG tablet Take by mouth.  . Calcium-Magnesium 100-50 MG TABS Take by mouth.  . Cholecalciferol (VITAMIN D-1000 MAX ST) 25 MCG (1000 UT) tablet Take by mouth.  . diclofenac sodium (VOLTAREN) 1 % GEL Apply 4 g topically 4 (four) times daily. (Patient not taking: Reported on 12/11/2018)  . ENTRESTO 49-51 MG TK 1 T PO BID  . Ertugliflozin L-PyroglutamicAc (STEGLATRO) 5 MG TABS Take 5 mg by mouth daily.   Marland Kitchen ezetimibe (ZETIA) 10 MG tablet TK 1 T PO D  . FARXIGA 5 MG TABS tablet Take 5 mg by mouth daily.  Marland Kitchen GLUCOSAMINE-CHONDROITIN PO Take 1 tablet by mouth daily.  . metoprolol succinate (TOPROL-XL) 100 MG 24 hr tablet TK 1 AND 1/2  TABLETS PO D  . nitroGLYCERIN (NITROSTAT) 0.4 MG SL tablet Place 0.4 mg under the tongue every 5 (five) minutes as needed for chest pain.  . pravastatin (PRAVACHOL) 20 MG tablet Take 20 mg by mouth daily.  Marland Kitchen spironolactone (ALDACTONE) 25 MG tablet TK  1 T PO QD  . vitamin B-12 (CYANOCOBALAMIN) 1000 MCG tablet Take by mouth.   No facility-administered encounter medications on file as of 03/11/2019.     Goals Addressed            This Visit's Progress     Patient Stated   . "I can't afford my medication" (pt-stated)       Current Barriers:  . Financial Barriers: resolved; patient approved for [facility] assistance, and is now taking twice daily as prescribed. . Polypharmacy; complex patient with multiple comorbidities including CAD hx STEMI, aortic stenosis hx valve replacement, CHF, ICD, HLD, T2DM o CAD/HLD: pravastatin 40 mg daily, ezetimibe 10 mg daily;  o CHF: Entresto 49/51 mg BID, metoprolol succinate 150 mg daily, spironolactone 25 mg daily; notes his prescription for metoprolol is due for a refill - Receiving Entresto from [employer] Patient Assistance through 03/12/2019. Faxed form to Samuel Morris at [facility] on 03/04/2019.  o T2DM: Farxiga 5 mg daily   Pharmacist Clinical Goal(s):  Marland Kitchen Over the next 90 days, patient will work with PharmD and providers for optimized medication management  Interventions: . Received voicemail from [place], [place] w/ Samuel Morris, with questions about the [facility] application. Called Samuel Morris back, left voicemail for her to return my call at her convenience  Patient Self Care Activities:  . Patient will take medications as prescribed.   Please see past updates related to this goal by clicking on the "Past Updates" button  in the selected goal         Plan: - Will await call back from Samuel Morris office. If I have not heard back, will follow up next week  Samuel Morris, PharmD, [place] [phone]

## 2019-03-18 ENCOUNTER — Ambulatory Visit: Payer: Self-pay | Admitting: Pharmacist

## 2019-03-18 DIAGNOSIS — I420 Dilated cardiomyopathy: Secondary | ICD-10-CM

## 2019-03-18 DIAGNOSIS — I2510 Atherosclerotic heart disease of native coronary artery without angina pectoris: Secondary | ICD-10-CM

## 2019-03-18 NOTE — Patient Instructions (Signed)
Visit Information  Goals Addressed            This Visit's Progress     Patient Stated   . "I can't afford my medication" (pt-stated)       Current Barriers:  . Financial Barriers: resolved; patient approved for [facility] assistance, and is now taking twice daily as prescribed. . Polypharmacy; complex patient with multiple comorbidities including CAD hx STEMI, aortic stenosis hx valve replacement, CHF, ICD, HLD, T2DM o CAD/HLD: pravastatin 40 mg daily, ezetimibe 10 mg daily;  o CHF: Entresto 49/51 mg BID, metoprolol succinate 150 mg daily, spironolactone 25 mg daily; notes his prescription for metoprolol is due for a refill - Receiving Entresto from [employer] Patient Assistance through 03/12/2019. Faxed form to Dr. Alva Garnet at [facility] on 03/04/2019. Called to follow up on this today.  o T2DM: Farxiga 5 mg daily   Pharmacist Clinical Goal(s):  Marland Kitchen Over the next 90 days, patient will work with PharmD and providers for optimized medication management  Interventions: . Received voicemail from [place], [place] w/ Dr. Alva Garnet, that she is completing the patient assistance application for [facility]. She has mailed patient his portion of the application, and has discussed this with patient. Will defer PAP completion and follow up to Dr. Lanetta Inch office.   Patient Self Care Activities:  . Patient will take medications as prescribed.   Please see past updates related to this goal by clicking on the "Past Updates" button in the selected goal         The patient verbalized understanding of instructions provided today and declined a print copy of patient instruction materials.   Plan: - Will outreach patient in February as previously scheduled.   Catie Darnelle Maffucci, PharmD, [place] [phone]

## 2019-03-18 NOTE — Chronic Care Management (AMB) (Signed)
Chronic Care Management   Follow Up Note   03/18/2019 Name: Samuel Morris MRN: [id number] DOB: [birth year]  Referred by: Valerie Roys, DO Reason for referral : Chronic Care Management (Medication Management)   Samuel Morris is a 74 y.o. year old male who is a primary care patient of Valerie Roys, DO. The CCM team was consulted for assistance with chronic disease management and care coordination needs.    Care coordination completed today.   Review of patient status, including review of consultants reports, relevant laboratory and other test results, and collaboration with appropriate care team members and the patient's provider was performed as part of comprehensive patient evaluation and provision of chronic care management services.    SDOH (Social Determinants of Health) screening performed today: Financial Strain . See Care Plan for related entries.   Outpatient Encounter Medications as of 03/18/2019  Medication Sig  . Ascorbic Acid (VITAMIN C PO) Take 100 mg by mouth daily.  Marland Kitchen aspirin EC 81 MG tablet Take by mouth.  . Calcium-Magnesium 100-50 MG TABS Take by mouth.  . Cholecalciferol (VITAMIN D-1000 MAX ST) 25 MCG (1000 UT) tablet Take by mouth.  . diclofenac sodium (VOLTAREN) 1 % GEL Apply 4 g topically 4 (four) times daily. (Patient not taking: Reported on 12/11/2018)  . ENTRESTO 49-51 MG TK 1 T PO BID  . Ertugliflozin L-PyroglutamicAc (STEGLATRO) 5 MG TABS Take 5 mg by mouth daily.   Marland Kitchen ezetimibe (ZETIA) 10 MG tablet TAKE 1 TABLET BY MOUTH DAILY  . FARXIGA 5 MG TABS tablet Take 5 mg by mouth daily.  Marland Kitchen GLUCOSAMINE-CHONDROITIN PO Take 1 tablet by mouth daily.  . metoprolol succinate (TOPROL-XL) 100 MG 24 hr tablet TAKE 1 AND 1/2 TABLETS BY MOUTH DAILY  . nitroGLYCERIN (NITROSTAT) 0.4 MG SL tablet Place 0.4 mg under the tongue every 5 (five) minutes as needed for chest pain.  . pravastatin (PRAVACHOL) 20 MG tablet Take 20 mg by mouth daily.  Marland Kitchen spironolactone (ALDACTONE) 25 MG  tablet TK 1 T PO QD  . vitamin B-12 (CYANOCOBALAMIN) 1000 MCG tablet Take by mouth.   No facility-administered encounter medications on file as of 03/18/2019.     Goals Addressed            This Visit's Progress     Patient Stated   . "I can't afford my medication" (pt-stated)       Current Barriers:  . Financial Barriers: resolved; patient approved for [facility] assistance, and is now taking twice daily as prescribed. . Polypharmacy; complex patient with multiple comorbidities including CAD hx STEMI, aortic stenosis hx valve replacement, CHF, ICD, HLD, T2DM o CAD/HLD: pravastatin 40 mg daily, ezetimibe 10 mg daily;  o CHF: Entresto 49/51 mg BID, metoprolol succinate 150 mg daily, spironolactone 25 mg daily; notes his prescription for metoprolol is due for a refill - Receiving Entresto from [employer] Patient Assistance through 03/12/2019. Faxed form to Dr. Alva Garnet at [facility] on 03/04/2019. Called to follow up on this today.  o T2DM: Farxiga 5 mg daily   Pharmacist Clinical Goal(s):  Marland Kitchen Over the next 90 days, patient will work with PharmD and providers for optimized medication management  Interventions: . Received voicemail from [place], [place] w/ Dr. Alva Garnet, that she is completing the patient assistance application for [facility]. She has mailed patient his portion of the application, and has discussed this with patient. Will defer PAP completion and follow up to Dr. Lanetta Inch office.   Patient Self Care Activities:  .  Patient will take medications as prescribed.   Please see past updates related to this goal by clicking on the "Past Updates" button in the selected goal         Plan: - Will outreach patient in February as previously scheduled.   Catie Darnelle Maffucci, PharmD, [place] [phone]

## 2019-04-01 ENCOUNTER — Ambulatory Visit (INDEPENDENT_AMBULATORY_CARE_PROVIDER_SITE_OTHER): Payer: Medicare Other

## 2019-04-01 VITALS — BP 111/66 | HR 68 | Ht 68.0 in | Wt 180.0 lb

## 2019-04-01 DIAGNOSIS — Z0000 Encounter for general adult medical examination without abnormal findings: Secondary | ICD-10-CM | POA: Diagnosis not present

## 2019-04-01 NOTE — Progress Notes (Signed)
Subjective:   Samuel Morris is a 74 y.o. male who presents for Medicare Annual/Subsequent preventive examination.  This visit is being conducted via phone call  - after an attmept to do on video chat - due to the COVID-19 pandemic. This patient has given me verbal consent via phone to conduct this visit, patient states they are participating from their home address. Some vital signs may be absent or patient reported.   Patient identification: identified by name, DOB, and current address.    Review of Systems:   Cardiac Risk Factors include: male gender;advanced age (>29men, >63 women);dyslipidemia;diabetes mellitus;hypertension     Objective:    Vitals: BP 111/66   Pulse 68   Ht 5\' 8"  (1.727 m)   Wt 180 lb (81.6 kg)   SpO2 98%   BMI 27.37 kg/m   Body mass index is 27.37 kg/m.  Advanced Directives 04/01/2019  Does Patient Have a Medical Advance Directive? Yes  Type of Advance Directive Living will;Healthcare Power of [place] in Chart? No - copy requested    Tobacco Social History   Tobacco Use  Smoking Status Never Smoker  Smokeless Tobacco Never Used     Counseling given: Not Answered   Clinical Intake:  Pre-visit preparation completed: Yes  Pain : No/denies pain     Nutritional Status: BMI 25 -29 Overweight Nutritional Risks: None Diabetes: No  How often do you need to have someone help you when you read instructions, pamphlets, or other written materials from your doctor or pharmacy?: 1 - Never  Interpreter Needed?: No  Information entered by :: Taline Nass,LPN  Past Medical History:  Diagnosis Date  . Cancer (HCC)    Scalp  . Cataract   . Diabetes mellitus without complication ([place])   . Heart attack ([place])   . Heart disease   . Hyperlipidemia   . Hypertension   . Sleep apnea   . Stenosis of aorta    Past Surgical History:  Procedure Laterality Date  . CARDIAC DEFIBRILLATOR PLACEMENT    . CARDIAC  SURGERY     Family History  Problem Relation Age of Onset  . Heart disease Mother   . Heart attack Father   . Arthritis Sister   . Hypertension Sister   . Hypertension Brother   . Heart disease Paternal Grandmother   . Heart murmur Sister    Social History   Socioeconomic History  . Marital status: Married    Spouse name: Not on file  . Number of children: Not on file  . Years of education: Not on file  . Highest education level: Not on file  Occupational History  . Not on file  Tobacco Use  . Smoking status: Never Smoker  . Smokeless tobacco: Never Used  Substance and Sexual Activity  . Alcohol use: Yes    Comment: On occasion  . Drug use: Never  . Sexual activity: Yes    Birth control/protection: None  Other Topics Concern  . Not on file  Social History Narrative  . Not on file   Social Determinants of Health   Financial Resource Strain:   . Difficulty of Paying Living Expenses: Not on file  Food Insecurity:   . Worried About Charity fundraiser in the Last Year: Not on file  . Ran Out of Food in the Last Year: Not on file  Transportation Needs:   . Lack of Transportation (Medical): Not on file  . Lack  of Transportation (Non-Medical): Not on file  Physical Activity:   . Days of Exercise per Week: Not on file  . Minutes of Exercise per Session: Not on file  Stress:   . Feeling of Stress : Not on file  Social Connections:   . Frequency of Communication with Friends and Family: Not on file  . Frequency of Social Gatherings with Friends and Family: Not on file  . Attends Religious Services: Not on file  . Active Member of Clubs or Organizations: Not on file  . Attends Archivist Meetings: Not on file  . Marital Status: Not on file    Outpatient Encounter Medications as of 04/01/2019  Medication Sig  . Ascorbic Acid (VITAMIN C PO) Take 100 mg by mouth daily.  Marland Kitchen aspirin EC 81 MG tablet Take by mouth.  . Calcium-Magnesium 100-50 MG TABS Take by  mouth.  . Cholecalciferol (VITAMIN D-1000 MAX ST) 25 MCG (1000 UT) tablet Take by mouth.  . ENTRESTO 49-51 MG TK 1 T PO BID  . ezetimibe (ZETIA) 10 MG tablet TAKE 1 TABLET BY MOUTH DAILY  . FARXIGA 5 MG TABS tablet Take 5 mg by mouth daily.  Marland Kitchen GLUCOSAMINE-CHONDROITIN PO Take 1 tablet by mouth daily.  . metoprolol succinate (TOPROL-XL) 100 MG 24 hr tablet TAKE 1 AND 1/2 TABLETS BY MOUTH DAILY  . pravastatin (PRAVACHOL) 20 MG tablet Take 20 mg by mouth daily.  Marland Kitchen spironolactone (ALDACTONE) 25 MG tablet TK 1 T PO QD  . vitamin B-12 (CYANOCOBALAMIN) 1000 MCG tablet Take by mouth.  . diclofenac sodium (VOLTAREN) 1 % GEL Apply 4 g topically 4 (four) times daily. (Patient not taking: Reported on 04/01/2019)  . Ertugliflozin L-PyroglutamicAc (STEGLATRO) 5 MG TABS Take 5 mg by mouth daily.   . nitroGLYCERIN (NITROSTAT) 0.4 MG SL tablet Place 0.4 mg under the tongue every 5 (five) minutes as needed for chest pain.   No facility-administered encounter medications on file as of 04/01/2019.    Activities of Daily Living In your present state of health, do you have any difficulty performing the following activities: 04/01/2019 11/05/2018  Hearing? N N  Comment no hearing aids -  Vision? Y N  Comment cataracts, [email or web address] -  Difficulty concentrating or making decisions? N N  Walking or climbing stairs? N N  Comment arthritis in knee -  Dressing or bathing? N N  Doing errands, shopping? N N  Preparing Food and eating ? N -  Using the Toilet? N -  In the past six months, have you accidently leaked urine? N -  Comment urine is slow stream -  Do you have problems with loss of bowel control? N -  Managing your Medications? N -  Managing your Finances? N -  Housekeeping or managing your Housekeeping? N -  Some recent data might be hidden    Patient Care Team: Valerie Roys, DO as PCP - General (Family Medicine) De Hollingshead, [facility] as Pharmacist (Pharmacist)   Assessment:   This is  a routine wellness examination for [place].  Exercise Activities and Dietary recommendations Current Exercise Habits: The patient does not participate in regular exercise at present(stays active), Exercise limited by: None identified  Goals    . "I can't afford my medication" (pt-stated)     Current Barriers:  . Financial Barriers: resolved; patient approved for [facility] assistance, and is now taking twice daily as prescribed. . Polypharmacy; complex patient with multiple comorbidities including CAD hx STEMI, aortic stenosis hx valve  replacement, CHF, ICD, HLD, T2DM o CAD/HLD: pravastatin 40 mg daily, ezetimibe 10 mg daily;  o CHF: Entresto 49/51 mg BID, metoprolol succinate 150 mg daily, spironolactone 25 mg daily; notes his prescription for metoprolol is due for a refill - Receiving Entresto from [employer] Patient Assistance through 03/12/2019. Faxed form to Dr[facility] Alva Garnet at [facility] on 03/04/2019. Called to follow up on this today.  o T2DM: Farxiga 5 mg daily   Pharmacist Clinical Goal(s):  Marland Kitchen Over the next 90 days, patient will work with PharmD and providers for optimized medication management  Interventions: . Received voicemail from [place], [place] w/ Dr. Alva Garnet, that she is completing the patient assistance application for [facility]. She has mailed patient his portion of the application, and has discussed this with patient. Will defer PAP completion and follow up to Dr. Lanetta Inch office.   Patient Self Care Activities:  . Patient will take medications as prescribed.   Please see past updates related to this goal by clicking on the "Past Updates" button in the selected goal         Fall Risk: Fall Risk  04/01/2019 11/05/2018  Falls in the past year? 0 0  Number falls in past yr: 0 0  Injury with Fall? 0 0    FALL RISK PREVENTION PERTAINING TO THE HOME:  Any stairs in or around the home? Yes  If so, are there any without handrails? No   Home free of loose throw rugs  in walkways, pet beds, electrical cords, etc? Yes  Adequate lighting in your home to reduce risk of falls? Yes   ASSISTIVE DEVICES UTILIZED TO PREVENT FALLS:  Life alert? No  Use of a cane, walker or w/c? No  Grab bars in the bathroom? No  Shower chair or bench in shower? No  Elevated toilet seat or a handicapped toilet? No   TIMED UP AND GO:  Unable to perform   Depression Screen PHQ 2/9 Scores 04/01/2019 11/05/2018  PHQ - 2 Score 0 0    Cognitive Function        Immunization History  Administered Date(s) Administered  . Fluad Quad(high Dose 65+) 11/06/2018  . Influenza, High Dose Seasonal PF 12/11/2016, 03/14/2018  . Pneumococcal Conjugate-13 12/14/2013  . Pneumococcal Polysaccharide-23 12/01/2014    Qualifies for Shingles Vaccine? Yes  Zostavax completed n/a.  Due for Shingrix. Education has been provided regarding the importance of this vaccine. Pt has been advised to call insurance company to determine out of pocket expense. Advised may also receive vaccine at local pharmacy or Health Dept. Verbalized acceptance and understanding.   Tdap: Discussed need for TD/TDAP vaccine, patient verbalized understanding that this is not covered as a preventative with there insurance and to call the office if his develops any new skin injuries, ie: cuts, scrapes, bug bites, or open wounds.   Flu Vaccine: up to date   Pneumococcal Vaccine: up to date   Screening Tests Health Maintenance  Topic Date Due  . FOOT EXAM  09/24/1955  . OPHTHALMOLOGY EXAM  09/24/1955  . COLONOSCOPY  09/24/1994  . TETANUS/TDAP  11/06/2019 (Originally 09[birth year]24[birth year]1963)  . HEMOGLOBIN A1C  05/09/2019  . URINE MICROALBUMIN  11/06/2019  . INFLUENZA VACCINE  Completed  . Hepatitis C Screening  Completed  . PNA vac Low Risk Adult  Completed   Cancer Screenings:  Colorectal Screening: Completed in [place] over. Cologuard information mailed to patient.   Lung Cancer Screening: (Low Dose CT Chest recommended  if Age 12-80 years,  30 pack-year currently smoking OR have quit w/in 15years.) does not qualify.    Additional Screening:  Hepatitis C Screening: does qualify; Completed 11/06/2018  Vision Screening: Recommended annual ophthalmology exams for early detection of glaucoma and other disorders of the eye. Is the patient up to date with their annual eye exam?  Yes  Who is the provider or what is the name of the office in which the pt attends annual eye exams? Dr.Woodard   Dental Screening: Recommended annual dental exams for proper oral hygiene  Community Resource Referral:  CRR required this visit?  No        Plan:  I have personally reviewed and addressed the Medicare Annual Wellness questionnaire and have noted the following in the patient's chart:  A. Medical and social history B. Use of alcohol, tobacco or illicit drugs  C. Current medications and supplements D. Functional ability and status E.  Nutritional status F.  Physical activity G. Advance directives H. List of other physicians I.  Hospitalizations, surgeries, and ER visits in previous 12 months J.  [place] such as hearing and vision if needed, cognitive and depression L. Referrals and appointments   In addition, I have reviewed and discussed with patient certain preventive protocols, quality metrics, and best practice recommendations. A written personalized care plan for preventive services as well as general preventive health recommendations were provided to patient.   Signed,   Bevelyn Ngo, LPN  [id number] Nurse Health Advisor   Nurse Notes: none

## 2019-04-01 NOTE — Patient Instructions (Signed)
Samuel Morris , Thank you for taking time to come for your Medicare Wellness Visit. I appreciate your ongoing commitment to your health goals. Please review the following plan we discussed and let me know if I can assist you in the future.   Screening recommendations/referrals: Colonoscopy: cologuard information mailed Recommended yearly ophthalmology/optometry visit for glaucoma screening and checkup Recommended yearly dental visit for hygiene and checkup  Vaccinations: Influenza vaccine: up to date  Pneumococcal vaccine: up to date  Tdap vaccine: due now  Shingles vaccine: shingrix eligible     Advanced directives: Please bring a copy of your health care power of attorney and living will to the office at your convenience.  Conditions/risks identified: diabetic.   We are recommending the vaccine to everyone who has not had an allergic reaction to any of the components of the vaccine. If you have specific questions about the vaccine, please bring them up with your health care provider to discuss them.   We will likely not be getting the vaccine in the office for the first rounds of vaccinations. The way they are releasing the vaccines is going to be through the health systems (like [place], [place], [facility], [place]) or through your county health department.   The [facility] Department is giving vaccines to those 75+ starting 03/18/19  M-F 7AM to 4PM [place] [address] [place], [place] in a drive through tent  If you are 65+ you can get a vaccine through [facility] by signing up for an appointment.  You can sign up by going to: [email or web address].  You can get more information by going to: [email or web address]  Next appointment: Follow up in one year for your annual wellness visit.   Preventive Care 56 Years and Older, Male Preventive care refers to lifestyle choices and visits with your health care provider that can promote  health and wellness. What does preventive care include?  A yearly physical exam. This is also called an annual well check.  Dental exams once or twice a year.  Routine eye exams. Ask your health care provider how often you should have your eyes checked.  Personal lifestyle choices, including:  Daily care of your teeth and gums.  Regular physical activity.  Eating a healthy diet.  Avoiding tobacco and drug use.  Limiting alcohol use.  Practicing safe sex.  Taking low doses of aspirin every day.  Taking vitamin and mineral supplements as recommended by your health care provider. What happens during an annual well check? The services and screenings done by your health care provider during your annual well check will depend on your age, overall health, lifestyle risk factors, and family history of disease. Counseling  Your health care provider may ask you questions about your:  Alcohol use.  Tobacco use.  Drug use.  Emotional well-being.  Home and relationship well-being.  Sexual activity.  Eating habits.  History of falls.  Memory and ability to understand (cognition).  Work and work Statistician. Screening  You may have the following tests or measurements:  Height, weight, and BMI.  Blood pressure.  Lipid and cholesterol levels. These may be checked every 5 years, or more frequently if you are over 86 years old.  Skin check.  Lung cancer screening. You may have this screening every year starting at age 75 if you have a 30-pack-year history of smoking and currently smoke or have quit within the past 15 years.  Fecal occult blood test (FOBT) of the  stool. You may have this test every year starting at age 75.  Flexible sigmoidoscopy or colonoscopy. You may have a sigmoidoscopy every 5 years or a colonoscopy every 10 years starting at age 58.  Prostate cancer screening. Recommendations will vary depending on your family history and other risks.  Hepatitis  C blood test.  Hepatitis B blood test.  Sexually transmitted disease (STD) testing.  Diabetes screening. This is done by checking your blood sugar (glucose) after you have not eaten for a while (fasting). You may have this done every 1-3 years.  Abdominal aortic aneurysm (AAA) screening. You may need this if you are a current or former smoker.  Osteoporosis. You may be screened starting at age 75 if you are at high risk. Talk with your health care provider about your test results, treatment options, and if necessary, the need for more tests. Vaccines  Your health care provider may recommend certain vaccines, such as:  Influenza vaccine. This is recommended every year.  Tetanus, diphtheria, and acellular pertussis (Tdap, Td) vaccine. You may need a Td booster every 10 years.  Zoster vaccine. You may need this after age 75.  Pneumococcal 13-valent conjugate (PCV13) vaccine. One dose is recommended after age 75.  Pneumococcal polysaccharide (PPSV23) vaccine. One dose is recommended after age 75. Talk to your health care provider about which screenings and vaccines you need and how often you need them. This information is not intended to replace advice given to you by your health care provider. Make sure you discuss any questions you have with your health care provider. Document Released: 03/25/2015 Document Revised: 11/16/2015 Document Reviewed: 12/28/2014 Elsevier Interactive Patient Education  2017 [employer] Prevention in the Home Falls can cause injuries. They can happen to people of all ages. There are many things you can do to make your home safe and to help prevent falls. What can I do on the outside of my home?  Regularly fix the edges of walkways and driveways and fix any cracks.  Remove anything that might make you trip as you walk through a door, such as a raised step or threshold.  Trim any bushes or trees on the path to your home.  Use bright outdoor  lighting.  Clear any walking paths of anything that might make someone trip, such as rocks or tools.  Regularly check to see if handrails are loose or broken. Make sure that both sides of any steps have handrails.  Any raised decks and porches should have guardrails on the edges.  Have any leaves, snow, or ice cleared regularly.  Use sand or salt on walking paths during winter.  Clean up any spills in your garage right away. This includes oil or grease spills. What can I do in the bathroom?  Use night lights.  Install grab bars by the toilet and in the tub and shower. Do not use towel bars as grab bars.  Use non-skid mats or decals in the tub or shower.  If you need to sit down in the shower, use a plastic, non-slip stool.  Keep the floor dry. Clean up any water that spills on the floor as soon as it happens.  Remove soap buildup in the tub or shower regularly.  Attach bath mats securely with double-sided non-slip rug tape.  Do not have throw rugs and other things on the floor that can make you trip. What can I do in the bedroom?  Use night lights.  Make sure that you  have a light by your bed that is easy to reach.  Do not use any sheets or blankets that are too big for your bed. They should not hang down onto the floor.  Have a firm chair that has side arms. You can use this for support while you get dressed.  Do not have throw rugs and other things on the floor that can make you trip. What can I do in the kitchen?  Clean up any spills right away.  Avoid walking on wet floors.  Keep items that you use a lot in easy-to-reach places.  If you need to reach something above you, use a strong step stool that has a grab bar.  Keep electrical cords out of the way.  Do not use floor polish or wax that makes floors slippery. If you must use wax, use non-skid floor wax.  Do not have throw rugs and other things on the floor that can make you trip. What can I do with my  stairs?  Do not leave any items on the stairs.  Make sure that there are handrails on both sides of the stairs and use them. Fix handrails that are broken or loose. Make sure that handrails are as long as the stairways.  Check any carpeting to make sure that it is firmly attached to the stairs. Fix any carpet that is loose or worn.  Avoid having throw rugs at the top or bottom of the stairs. If you do have throw rugs, attach them to the floor with carpet tape.  Make sure that you have a light switch at the top of the stairs and the bottom of the stairs. If you do not have them, ask someone to add them for you. What else can I do to help prevent falls?  Wear shoes that:  Do not have high heels.  Have rubber bottoms.  Are comfortable and fit you well.  Are closed at the toe. Do not wear sandals.  If you use a stepladder:  Make sure that it is fully opened. Do not climb a closed stepladder.  Make sure that both sides of the stepladder are locked into place.  Ask someone to hold it for you, if possible.  Clearly mark and make sure that you can see:  Any grab bars or handrails.  First and last steps.  Where the edge of each step is.  Use tools that help you move around (mobility aids) if they are needed. These include:  Canes.  Walkers.  Scooters.  Crutches.  Turn on the lights when you go into a dark area. Replace any light bulbs as soon as they burn out.  Set up your furniture so you have a clear path. Avoid moving your furniture around.  If any of your floors are uneven, fix them.  If there are any pets around you, be aware of where they are.  Review your medicines with your doctor. Some medicines can make you feel dizzy. This can increase your chance of falling. Ask your doctor what other things that you can do to help prevent falls. This information is not intended to replace advice given to you by your health care provider. Make sure you discuss any  questions you have with your health care provider. Document Released: 12/23/2008 Document Revised: 08/04/2015 Document Reviewed: 04/02/2014 Elsevier Interactive Patient Education  2017 [employer].

## 2019-04-15 ENCOUNTER — Ambulatory Visit (INDEPENDENT_AMBULATORY_CARE_PROVIDER_SITE_OTHER): Payer: Medicare Other | Admitting: Pharmacist

## 2019-04-15 DIAGNOSIS — I2510 Atherosclerotic heart disease of native coronary artery without angina pectoris: Secondary | ICD-10-CM

## 2019-04-15 NOTE — Chronic Care Management (AMB) (Signed)
Chronic Care Management   Follow Up Note   04/15/2019 Name: Esko Imparato MRN: [id number] DOB: August 29, 1945  Referred by: Valerie Roys, DO Reason for referral : Chronic Care Management   Amado Richcreek is a 74 y.o. year old male who is a primary care patient of Valerie Roys, DO. The CCM team was consulted for assistance with chronic disease management and care coordination needs.    Care coordination completed today.   Review of patient status, including review of consultants reports, relevant laboratory and other test results, and collaboration with appropriate care team members and the patient's provider was performed as part of comprehensive patient evaluation and provision of chronic care management services.    SDOH (Social Determinants of Health) screening performed today: Financial Strain . See Care Plan for related entries.   Outpatient Encounter Medications as of 04/15/2019  Medication Sig Note  . Ascorbic Acid (VITAMIN C PO) Take 100 mg by mouth daily.   Marland Kitchen aspirin EC 81 MG tablet Take by mouth.   . Calcium-Magnesium 100-50 MG TABS Take by mouth.   . Cholecalciferol (VITAMIN D-1000 MAX ST) 25 MCG (1000 UT) tablet Take by mouth.   . diclofenac sodium (VOLTAREN) 1 % GEL Apply 4 g topically 4 (four) times daily. (Patient not taking: Reported on 04/01/2019) 04/01/2019: To expensive   . ENTRESTO 49-51 MG TK 1 T PO BID   . Ertugliflozin L-PyroglutamicAc (STEGLATRO) 5 MG TABS Take 5 mg by mouth daily.    Marland Kitchen ezetimibe (ZETIA) 10 MG tablet TAKE 1 TABLET BY MOUTH DAILY   . FARXIGA 5 MG TABS tablet Take 5 mg by mouth daily.   Marland Kitchen GLUCOSAMINE-CHONDROITIN PO Take 1 tablet by mouth daily.   . metoprolol succinate (TOPROL-XL) 100 MG 24 hr tablet TAKE 1 AND 1/2 TABLETS BY MOUTH DAILY   . nitroGLYCERIN (NITROSTAT) 0.4 MG SL tablet Place 0.4 mg under the tongue every 5 (five) minutes as needed for chest pain.   . pravastatin (PRAVACHOL) 20 MG tablet Take 20 mg by mouth daily.   Marland Kitchen spironolactone  (ALDACTONE) 25 MG tablet TK 1 T PO QD   . vitamin B-12 (CYANOCOBALAMIN) 1000 MCG tablet Take by mouth.    No facility-administered encounter medications on file as of 04/15/2019.     Objective:   Goals Addressed            This Visit's Progress     Patient Stated   . "I can't afford my medication" (pt-stated)       Current Barriers:  . Financial Barriers: due to reapply for Entresto assistance for 2021 . Polypharmacy; complex patient with multiple comorbidities including CAD hx STEMI, aortic stenosis hx valve replacement, CHF, ICD, HLD, T2DM o CAD/HLD: pravastatin 40 mg daily, ezetimibe 10 mg daily;  o CHF: Entresto 49/51 mg BID, metoprolol succinate 150 mg daily, spironolactone 25 mg daily; - Received provider portion of Entresto assistance application form from Dr. Alva Garnet via fax today o T2DM: Farxiga 5 mg daily   Pharmacist Clinical Goal(s):  Marland Kitchen Over the next 90 days, patient will work with PharmD and providers for optimized medication management  Interventions: . Contacted patient. Will mail him the patient portion of the application for his completion. He will bring back to me at [facility]. Can use the 2019 tax return financial information that he previously submitted. Once all parts received, will fax to [employer].   Patient Self Care Activities:  . Patient will take medications as prescribed.   Please  see past updates related to this goal by clicking on the "Past Updates" button in the selected goal          Plan:  - Will collaborate w/ patient as above - F/u call scheduled 05/19/19  Catie Darnelle Maffucci, PharmD, [place] [phone]

## 2019-04-15 NOTE — Patient Instructions (Signed)
Visit Information  Goals Addressed            This Visit's Progress     Patient Stated   . "I can't afford my medication" (pt-stated)       Current Barriers:  . Financial Barriers: due to reapply for Entresto assistance for 2021 . Polypharmacy; complex patient with multiple comorbidities including CAD hx STEMI, aortic stenosis hx valve replacement, CHF, ICD, HLD, T2DM o CAD/HLD: pravastatin 40 mg daily, ezetimibe 10 mg daily;  o CHF: Entresto 49/51 mg BID, metoprolol succinate 150 mg daily, spironolactone 25 mg daily; - Received provider portion of Entresto assistance application form from Dr. Alva Garnet via fax today o T2DM: Farxiga 5 mg daily   Pharmacist Clinical Goal(s):  Marland Kitchen Over the next 90 days, patient will work with PharmD and providers for optimized medication management  Interventions: . Contacted patient. Will mail him the patient portion of the application for his completion. He will bring back to me at [facility]. Can use the 2019 tax return financial information that he previously submitted. Once all parts received, will fax to [employer].   Patient Self Care Activities:  . Patient will take medications as prescribed.   Please see past updates related to this goal by clicking on the "Past Updates" button in the selected goal         The patient verbalized understanding of instructions provided today and declined a print copy of patient instruction materials.   Plan:  - Will collaborate w/ patient as above - F/u call scheduled 05/19/19  Catie Darnelle Maffucci, PharmD, [place] [phone]

## 2019-04-21 ENCOUNTER — Telehealth: Payer: Self-pay

## 2019-05-08 ENCOUNTER — Ambulatory Visit (INDEPENDENT_AMBULATORY_CARE_PROVIDER_SITE_OTHER): Payer: Medicare Other | Admitting: Family Medicine

## 2019-05-08 ENCOUNTER — Encounter: Payer: Self-pay | Admitting: Family Medicine

## 2019-05-08 VITALS — BP 134/70 | HR 60 | Wt 181.0 lb

## 2019-05-08 DIAGNOSIS — I209 Angina pectoris, unspecified: Secondary | ICD-10-CM | POA: Diagnosis not present

## 2019-05-08 DIAGNOSIS — R3911 Hesitancy of micturition: Secondary | ICD-10-CM

## 2019-05-08 DIAGNOSIS — E119 Type 2 diabetes mellitus without complications: Secondary | ICD-10-CM

## 2019-05-08 DIAGNOSIS — E1169 Type 2 diabetes mellitus with other specified complication: Secondary | ICD-10-CM | POA: Diagnosis not present

## 2019-05-08 DIAGNOSIS — I10 Essential (primary) hypertension: Secondary | ICD-10-CM

## 2019-05-08 DIAGNOSIS — I420 Dilated cardiomyopathy: Secondary | ICD-10-CM

## 2019-05-08 DIAGNOSIS — E785 Hyperlipidemia, unspecified: Secondary | ICD-10-CM | POA: Diagnosis not present

## 2019-05-08 DIAGNOSIS — I2510 Atherosclerotic heart disease of native coronary artery without angina pectoris: Secondary | ICD-10-CM

## 2019-05-08 MED ORDER — METOPROLOL SUCCINATE ER 100 MG PO TB24: ORAL_TABLET | ORAL | 1 refills | Status: DC

## 2019-05-08 MED ORDER — EZETIMIBE 10 MG PO TABS: 10.0000 mg | ORAL_TABLET | Freq: Every day | ORAL | 1 refills | Status: DC

## 2019-05-08 NOTE — Progress Notes (Signed)
Lvm tto make this apt for a 6 month f/u

## 2019-05-08 NOTE — Progress Notes (Signed)
BP 134/70   Pulse 60   Wt 181 lb (82.1 kg)   SpO2 96%   BMI 27.52 kg/m    Subjective:    Patient ID: Samuel Morris, male    DOB: [birth year], 74 y.o.   MRN: [id number]  HPI: Samuel Morris is a 74 y.o. male  Chief Complaint  Patient presents with  . Coronary Artery Disease  . Diabetes  . Hyperlipidemia  . Hypertension   HYPERTENSION / [place] Satisfied with current treatment? yes Duration of hypertension: chronic BP monitoring frequency: a few times a month BP medication side effects: no Past BP meds: spironalactone, metoprolol Duration of hyperlipidemia: chronic Cholesterol medication side effects: no Cholesterol supplements: none Past cholesterol medications: crestor, zetia Medication compliance: excellent compliance Aspirin: yes Recent stressors: no Recurrent headaches: no Visual changes: no Palpitations: no Dyspnea: no Chest pain: no Lower extremity edema: no Dizzy/lightheaded: no  DIABETES Hypoglycemic episodes:no Polydipsia/polyuria: no Visual disturbance: no Chest pain: no Paresthesias: no Glucose Monitoring: yes  Accucheck frequency: Daily  Fasting glucose: 100 Taking Insulin?: no Blood Pressure Monitoring: a few times a week Retinal Examination: Not up to Date Foot Exam: Not up to Date Diabetic Education: Completed Pneumovax: Up to Date Influenza: Up to Date Aspirin: yes  Relevant past medical, surgical, family and social history reviewed and updated as indicated. Interim medical history since our last visit reviewed. Allergies and medications reviewed and updated.  Review of Systems  Constitutional: Negative.   HENT: Negative.   Respiratory: Negative.   Cardiovascular: Negative.   Musculoskeletal: Positive for arthralgias and joint swelling. Negative for back pain, gait problem, myalgias, neck pain and neck stiffness.  Neurological: Negative.   Psychiatric/Behavioral: Negative.     Per HPI unless specifically indicated above      Objective:    BP 134/70   Pulse 60   Wt 181 lb (82.1 kg)   SpO2 96%   BMI 27.52 kg/m   Wt Readings from Last 3 Encounters:  05/08/19 181 lb (82.1 kg)  04/01/19 180 lb (81.6 kg)  12/11/18 173 lb (78.5 kg)    Physical Exam Vitals and nursing note reviewed.  Pulmonary:     Effort: Pulmonary effort is normal. No respiratory distress.     Comments: Speaking in full sentences Neurological:     Mental Status: He is alert.  Psychiatric:        Mood and Affect: Mood normal.        Behavior: Behavior normal.        Thought Content: Thought content normal.        Judgment: Judgment normal.     Results for orders placed or performed in visit on 11/06/18  CBC with Differential/Platelet  Result Value Ref Range   WBC 6.9 3.4 - 10.8 x10E3/uL   RBC 5.54 4.14 - 5.80 x10E6/uL   Hemoglobin 15.8 13.0 - 17.7 g/dL   Hematocrit 47.8 37.5 - 51.0 %   MCV 86 79 - 97 fL   MCH 28.5 26.6 - 33.0 pg   MCHC 33.1 31.5 - 35.7 g/dL   RDW 13.0 11.6 - 15.4 %   Platelets 204 150 - 450 x10E3/uL   Neutrophils 61 Not Estab. %   Lymphs 21 Not Estab. %   Monocytes 12 Not Estab. %   Eos 5 Not Estab. %   Basos 1 Not Estab. %   Neutrophils Absolute 4.3 1.4 - 7.0 x10E3/uL   Lymphocytes Absolute 1.5 0.7 - 3.1 x10E3/uL   Monocytes Absolute 0.8 0.1 -  0.9 x10E3/uL   EOS (ABSOLUTE) 0.3 0.0 - 0.4 x10E3/uL   Basophils Absolute 0.0 0.0 - 0.2 x10E3/uL   Immature Granulocytes 0 Not Estab. %   Immature Grans (Abs) 0.0 0.0 - 0.1 x10E3/uL  Bayer DCA Hb A1c Waived  Result Value Ref Range   HB A1C (BAYER DCA - WAIVED) 5.8 <7.0 %  Comprehensive metabolic panel  Result Value Ref Range   Glucose 100 (H) 65 - 99 mg/dL   BUN 12 8 - 27 mg/dL   Creatinine, Ser 0.76 0.76 - 1.27 mg/dL   GFR calc non Af Amer 90 >59 mL/min/1.73   GFR calc Af Amer 104 >59 mL/min/1.73   BUN/Creatinine Ratio 16 10 - 24   Sodium 139 134 - 144 mmol/L   Potassium 4.6 3.5 - 5.2 mmol/L   Chloride 102 96 - 106 mmol/L   CO2 22 20 - 29 mmol/L    Calcium 9.3 8.6 - 10.2 mg/dL   Total Protein 6.6 6.0 - 8.5 g/dL   Albumin 4.6 3.7 - 4.7 g/dL   Globulin, Total 2.0 1.5 - 4.5 g/dL   Albumin/Globulin Ratio 2.3 (H) 1.2 - 2.2   Bilirubin Total 0.7 0.0 - 1.2 mg/dL   Alkaline Phosphatase 103 39 - 117 IU/L   AST 14 0 - 40 IU/L   ALT 12 0 - 44 IU/L  Lipid Panel w/o Chol/HDL Ratio  Result Value Ref Range   Cholesterol, Total 125 100 - 199 mg/dL   Triglycerides 82 0 - 149 mg/dL   HDL 51 >39 mg/dL   VLDL Cholesterol Cal 16 5 - 40 mg/dL   LDL Calculated 58 0 - 99 mg/dL  Microalbumin, Urine Waived  Result Value Ref Range   Microalb, Ur Waived 10 0 - 19 mg/L   Creatinine, Urine Waived 50 10 - 300 mg/dL   Microalb/Creat Ratio 30-300 (H) <30 mg/g  PSA  Result Value Ref Range   Prostate Specific Ag, Serum 2.9 0.0 - 4.0 ng/mL  TSH  Result Value Ref Range   TSH 3.240 0.450 - 4.500 uIU/mL  UA/M w/rflx Culture, Routine   Specimen: Blood   BLD  Result Value Ref Range   Specific Gravity, UA 1.015 1.005 - 1.030   pH, UA 5.0 5.0 - 7.5   Color, UA Yellow Yellow   Appearance Ur Clear Clear   Leukocytes,UA Negative Negative   Protein,UA Negative Negative/Trace   Glucose, UA 2+ (A) Negative   Ketones, UA Negative Negative   RBC, UA Negative Negative   Bilirubin, UA Negative Negative   Urobilinogen, Ur 0.2 0.2 - 1.0 mg/dL   Nitrite, UA Negative Negative  Hepatitis C Antibody  Result Value Ref Range   Hep C Virus Ab <0.1 0.0 - 0.9 s/co ratio      Assessment & Plan:   Problem List Items Addressed This Visit      Cardiovascular and Mediastinum   CAD (coronary artery disease)    Stable. Continues to follow with cardiology. Will keep sugars, BP and cholesterol under good control. Continue to monitor. Call with any concerns.       Relevant Medications   rosuvastatin (CRESTOR) 20 MG tablet   metoprolol succinate (TOPROL-XL) 100 MG 24 hr tablet   ezetimibe (ZETIA) 10 MG tablet   HTN (hypertension)    Under good control on current regimen.  Continue current regimen. Continue to monitor. Call with any concerns. Refills given. Labs to be drawn on Monday.       Relevant Medications  rosuvastatin (CRESTOR) 20 MG tablet   metoprolol succinate (TOPROL-XL) 100 MG 24 hr tablet   ezetimibe (ZETIA) 10 MG tablet   Other Relevant Orders   Comprehensive metabolic panel   Microalbumin, Urine Waived   Angina pectoris (HCC)    Stable. Continues to follow with cardiology. Will keep sugars, BP and cholesterol under good control. Continue to monitor. Call with any concerns.       Relevant Medications   rosuvastatin (CRESTOR) 20 MG tablet   metoprolol succinate (TOPROL-XL) 100 MG 24 hr tablet   ezetimibe (ZETIA) 10 MG tablet   Other Relevant Orders   Comprehensive metabolic panel   Dilated cardiomyopathy ([place])    Stable. Continues to follow with cardiology. Will keep sugars, BP and cholesterol under good control. Continue to monitor. Call with any concerns.       Relevant Medications   rosuvastatin (CRESTOR) 20 MG tablet   metoprolol succinate (TOPROL-XL) 100 MG 24 hr tablet   ezetimibe (ZETIA) 10 MG tablet   Other Relevant Orders   Comprehensive metabolic panel     Endocrine   Hyperlipidemia associated with type 2 diabetes mellitus ([place]) - Primary    Under good control on current regimen. Continue current regimen. Continue to monitor. Call with any concerns. Refills given. Labs to be drawn on Monday.       Relevant Medications   rosuvastatin (CRESTOR) 20 MG tablet   ezetimibe (ZETIA) 10 MG tablet   Other Relevant Orders   Comprehensive metabolic panel   Lipid Panel w/o Chol/HDL Ratio   Diabetes mellitus without complication (HCC)    Under good control on current regimen. Continue current regimen. Continue to monitor. Call with any concerns. Refills given. Labs to be drawn on Monday. If still in the 5s, consider stopping farxiga.       Relevant Medications   rosuvastatin (CRESTOR) 20 MG tablet   Other Relevant Orders    Bayer DCA Hb A1c Waived   Comprehensive metabolic panel   Microalbumin, Urine Waived    Other Visit Diagnoses    Hesitancy       Relevant Orders   UA/M w/rflx Culture, Routine   PSA       Follow up plan: No follow-ups on file.    . This visit was completed via telephone due to the restrictions of the COVID-19 pandemic. All issues as above were discussed and addressed but no physical exam was performed. If it was felt that the patient should be evaluated in the office, they were directed there. The patient verbally consented to this visit. Patient was unable to complete an audio/visual visit due to Lack of equipment. Due to the catastrophic nature of the COVID-19 pandemic, this visit was done through audio contact only. . Location of the patient: home . Location of the provider: work . Those involved with this call:  . Provider: Park Liter, DO . CMA: Lauretta Grill, RMA . Front Desk/Registration: Don Perking  . Time spent on call: 25 minutes on the phone discussing health concerns. 40 minutes total spent in review of patient's record and preparation of their chart.

## 2019-05-08 NOTE — Assessment & Plan Note (Signed)
Under good control on current regimen. Continue current regimen. Continue to monitor. Call with any concerns. Refills given. Labs to be drawn on Monday.

## 2019-05-08 NOTE — Assessment & Plan Note (Signed)
Stable. Continues to follow with cardiology. Will keep sugars, BP and cholesterol under good control. Continue to monitor. Call with any concerns.

## 2019-05-08 NOTE — Assessment & Plan Note (Signed)
Under good control on current regimen. Continue current regimen. Continue to monitor. Call with any concerns. Refills given. Labs to be drawn on Monday. If still in the 5s, consider stopping farxiga.

## 2019-05-11 ENCOUNTER — Other Ambulatory Visit: Payer: Medicare Other

## 2019-05-19 ENCOUNTER — Ambulatory Visit (INDEPENDENT_AMBULATORY_CARE_PROVIDER_SITE_OTHER): Payer: Medicare Other | Admitting: Pharmacist

## 2019-05-19 DIAGNOSIS — I10 Essential (primary) hypertension: Secondary | ICD-10-CM | POA: Diagnosis not present

## 2019-05-19 DIAGNOSIS — I2510 Atherosclerotic heart disease of native coronary artery without angina pectoris: Secondary | ICD-10-CM

## 2019-05-19 DIAGNOSIS — E119 Type 2 diabetes mellitus without complications: Secondary | ICD-10-CM

## 2019-05-19 NOTE — Chronic Care Management (AMB) (Signed)
Chronic Care Management   Follow Up Note   05/19/2019 Name: Samuel Morris MRN: [id number] DOB: [birth year]  Referred by: Valerie Roys, DO Reason for referral : Chronic Care Management (Medication Management)   Samuel Morris is a 74 y.o. year old male who is a primary care patient of Valerie Roys, DO. The CCM team was consulted for assistance with chronic disease management and care coordination needs.    Contacted patient for medication management review today.  Review of patient status, including review of consultants reports, relevant laboratory and other test results, and collaboration with appropriate care team members and the patient's provider was performed as part of comprehensive patient evaluation and provision of chronic care management services.    SDOH (Social Determinants of Health) assessments performed: Yes See Care Plan activities for detailed interventions related to SDOH)  SDOH Interventions     Most Recent Value  SDOH Interventions  SDOH Interventions for the Following Domains  Financial Strain  Financial Strain Interventions  Other (Comment) [patient assistance application]       Outpatient Encounter Medications as of 05/19/2019  Medication Sig Note  . Ascorbic Acid (VITAMIN C PO) Take 100 mg by mouth daily.   Marland Kitchen aspirin EC 81 MG tablet Take by mouth.   . Calcium-Magnesium 100-50 MG TABS Take by mouth.   . Cholecalciferol (VITAMIN D-1000 MAX ST) 25 MCG (1000 UT) tablet Take by mouth.   . ENTRESTO 49-51 MG TK 1 T PO BID   . ezetimibe (ZETIA) 10 MG tablet Take 1 tablet (10 mg total) by mouth daily.   Marland Kitchen GLUCOSAMINE-CHONDROITIN PO Take 1 tablet by mouth daily.   . metoprolol succinate (TOPROL-XL) 100 MG 24 hr tablet Take 1.5 tabs daily. Take with or immediately following a meal. 05/19/2019: Only taking 1 tablet daily  . rosuvastatin (CRESTOR) 20 MG tablet Take 20 mg by mouth at bedtime.   Marland Kitchen spironolactone (ALDACTONE) 25 MG tablet TK 1 T PO QD   . vitamin B-12  (CYANOCOBALAMIN) 1000 MCG tablet Take by mouth.   Marland Kitchen FARXIGA 5 MG TABS tablet Take 5 mg by mouth daily. 05/19/2019: Still taking Steglatro 5 mg daily right now  . nitroGLYCERIN (NITROSTAT) 0.4 MG SL tablet Place 0.4 mg under the tongue every 5 (five) minutes as needed for chest pain.    No facility-administered encounter medications on file as of 05/19/2019.     Objective:   Goals Addressed            This Visit's Progress     Patient Stated   . "I can't afford my medication" (pt-stated)       CARE PLAN ENTRY (see longtitudinal plan of care for additional care plan information)  Current Barriers:  . Financial Barriers: due to reapply for [facility] assistance for 2021 . Polypharmacy; complex patient with multiple comorbidities including CAD hx STEMI, aortic stenosis hx valve replacement, CHF, ICD, HLD, T2DM; follows w/ Dr. Alva Garnet w/ [facility] o CAD/HLD: rosuvastatin 20 mg daily, ezetimibe 10 mg daily- due for lipid panel, as multiple statin switches recently o CHF: Entresto 49/51 mg BID, metoprolol succinate 150 mg daily- though notes he is only taking 1 tablet (100 mg) daily, spironolactone 25 mg daily; - BP: 125-130s/70s; HR 69-70s o T2DM: Farxiga 5 mg daily prescribed, though is finishing a previous prescription for Steglatro 5 mg daily; due for A1c; reports fasting sugars in 100s  Pharmacist Clinical Goal(s):  Marland Kitchen Over the next 90 days, patient will work with PharmD and  providers for optimized medication management  Interventions: . Comprehensive medication review performed, medication list updated in electronic medical record . Reviewed recent visit w/ PCP. Patient was to come in for lab work this week, but notes his wife felt bad over the weekend and they wanted to rule out COVID first. He notes this has happened. I recommended he come in for lab work ASAP for routine labs . Reviewed mechanism of action and purpose of each medication. Patient verbalized understanding . Received  patient portion of Entresto application. Submitted completed application to [employer]. Will f/u in 1-2 weeks.   Patient Self Care Activities:  . Patient will take medications as prescribed.   Please see past updates related to this goal by clicking on the "Past Updates" button in the selected goal          Plan:  - Will outreach [employer] PAP as above - Scheduled f/u call 07/21/19  Catie Darnelle Maffucci, PharmD, [place] [phone]

## 2019-05-19 NOTE — Patient Instructions (Signed)
Visit Information  Goals Addressed            This Visit's Progress     Patient Stated   . "I can't afford my medication" (pt-stated)       CARE PLAN ENTRY (see longtitudinal plan of care for additional care plan information)  Current Barriers:  . Financial Barriers: due to reapply for [facility] assistance for 2021 . Polypharmacy; complex patient with multiple comorbidities including CAD hx STEMI, aortic stenosis hx valve replacement, CHF, ICD, HLD, T2DM; follows w/ Dr. Alva Garnet w/ [facility] o CAD/HLD: rosuvastatin 20 mg daily, ezetimibe 10 mg daily- due for lipid panel, as multiple statin switches recently o CHF: Entresto 49/51 mg BID, metoprolol succinate 150 mg daily- though notes he is only taking 1 tablet (100 mg) daily, spironolactone 25 mg daily; - BP: 125-130s/70s; HR 69-70s o T2DM: Farxiga 5 mg daily prescribed, though is finishing a previous prescription for Steglatro 5 mg daily; due for A1c; reports fasting sugars in 100s  Pharmacist Clinical Goal(s):  Marland Kitchen Over the next 90 days, patient will work with PharmD and providers for optimized medication management  Interventions: . Comprehensive medication review performed, medication list updated in electronic medical record . Reviewed recent visit w/ PCP. Patient was to come in for lab work this week, but notes his wife felt bad over the weekend and they wanted to rule out COVID first. He notes this has happened. I recommended he come in for lab work ASAP for routine labs . Reviewed mechanism of action and purpose of each medication. Patient verbalized understanding . Received patient portion of Entresto application. Submitted completed application to [employer]. Will f/u in 1-2 weeks.   Patient Self Care Activities:  . Patient will take medications as prescribed.   Please see past updates related to this goal by clicking on the "Past Updates" button in the selected goal         Patient verbalizes understanding of instructions  provided today.     Plan:  - Will outreach [employer] PAP as above - Scheduled f/u call 07/21/19  Catie Darnelle Maffucci, PharmD, [place] [phone]

## 2019-05-27 ENCOUNTER — Ambulatory Visit: Payer: Self-pay | Admitting: Pharmacist

## 2019-05-27 DIAGNOSIS — E119 Type 2 diabetes mellitus without complications: Secondary | ICD-10-CM

## 2019-05-27 NOTE — Patient Instructions (Signed)
Visit Information  Goals Addressed            This Visit's Progress     Patient Stated   . "I can't afford my medication" (pt-stated)       CARE PLAN ENTRY (see longtitudinal plan of care for additional care plan information)  Current Barriers:  . Financial Barriers: reapplied for Entresto assistance for 2021.  . Polypharmacy; complex patient with multiple comorbidities including CAD hx STEMI, aortic stenosis hx valve replacement, CHF, ICD, HLD, T2DM; follows w/ Dr. Alva Garnet w/ [facility] o CAD/HLD: rosuvastatin 20 mg daily, ezetimibe 10 mg daily- due for lipid panel, as multiple statin switches recently o CHF: Entresto 49/51 mg BID, metoprolol succinate 150 mg daily- though notes he is only taking 1 tablet (100 mg) daily, spironolactone 25 mg daily; - BP: 125-130s/70s; HR 69-70s o T2DM: Farxiga 5 mg daily prescribed, though is finishing a previous prescription for Steglatro 5 mg daily; due for A1c; reports fasting sugars in 100s  Pharmacist Clinical Goal(s):  Marland Kitchen Over the next 90 days, patient will work with PharmD and providers for optimized medication management  Interventions: . [employer]. Patient has been APPROVED for [facility] assistance 05/25/19-03/11/20. Order was sent to the pharmacy on that date. Will follow up with patient on shipping and reordering at our next scheduled phone call  Patient Self Care Activities:  . Patient will take medications as prescribed.   Please see past updates related to this goal by clicking on the "Past Updates" button in the selected goal         Patient verbalizes understanding of instructions provided today.    Plan:  - Will outreach as previously scheduled  Catie Darnelle Maffucci, PharmD, [place] [phone]

## 2019-05-27 NOTE — Chronic Care Management (AMB) (Signed)
Chronic Care Management   Follow Up Note   05/27/2019 Name: Howard Bruer MRN: [id number] DOB: 10-23-1945  Referred by: Valerie Roys, DO Reason for referral : Chronic Care Management (Medication Management)   Aarn Blocker is a 74 y.o. year old male who is a primary care patient of Valerie Roys, DO. The CCM team was consulted for assistance with chronic disease management and care coordination needs.    Care coordination completed today.   Review of patient status, including review of consultants reports, relevant laboratory and other test results, and collaboration with appropriate care team members and the patient's provider was performed as part of comprehensive patient evaluation and provision of chronic care management services.    SDOH (Social Determinants of Health) assessments performed: Yes See Care Plan activities for detailed interventions related to [facility])     Outpatient Encounter Medications as of 05/27/2019  Medication Sig Note  . Ascorbic Acid (VITAMIN C PO) Take 100 mg by mouth daily.   Marland Kitchen aspirin EC 81 MG tablet Take by mouth.   . Calcium-Magnesium 100-50 MG TABS Take by mouth.   . Cholecalciferol (VITAMIN D-1000 MAX ST) 25 MCG (1000 UT) tablet Take by mouth.   . ENTRESTO 49-51 MG TK 1 T PO BID   . ezetimibe (ZETIA) 10 MG tablet Take 1 tablet (10 mg total) by mouth daily.   Marland Kitchen FARXIGA 5 MG TABS tablet Take 5 mg by mouth daily. 05/19/2019: Still taking Steglatro 5 mg daily right now  . GLUCOSAMINE-CHONDROITIN PO Take 1 tablet by mouth daily.   . metoprolol succinate (TOPROL-XL) 100 MG 24 hr tablet Take 1.5 tabs daily. Take with or immediately following a meal. 05/19/2019: Only taking 1 tablet daily  . nitroGLYCERIN (NITROSTAT) 0.4 MG SL tablet Place 0.4 mg under the tongue every 5 (five) minutes as needed for chest pain.   . rosuvastatin (CRESTOR) 20 MG tablet Take 20 mg by mouth at bedtime.   Marland Kitchen spironolactone (ALDACTONE) 25 MG tablet TK 1 T PO QD   . vitamin B-12  (CYANOCOBALAMIN) 1000 MCG tablet Take by mouth.    No facility-administered encounter medications on file as of 05/27/2019.     Objective:   Goals Addressed            This Visit's Progress     Patient Stated   . "I can't afford my medication" (pt-stated)       CARE PLAN ENTRY (see longtitudinal plan of care for additional care plan information)  Current Barriers:  . Financial Barriers: reapplied for Entresto assistance for 2021.  . Polypharmacy; complex patient with multiple comorbidities including CAD hx STEMI, aortic stenosis hx valve replacement, CHF, ICD, HLD, T2DM; follows w/ Dr. Alva Garnet w/ [facility] o CAD/HLD: rosuvastatin 20 mg daily, ezetimibe 10 mg daily- due for lipid panel, as multiple statin switches recently o CHF: Entresto 49/51 mg BID, metoprolol succinate 150 mg daily- though notes he is only taking 1 tablet (100 mg) daily, spironolactone 25 mg daily; - BP: 125-130s/70s; HR 69-70s o T2DM: Farxiga 5 mg daily prescribed, though is finishing a previous prescription for Steglatro 5 mg daily; due for A1c; reports fasting sugars in 100s  Pharmacist Clinical Goal(s):  Marland Kitchen Over the next 90 days, patient will work with PharmD and providers for optimized medication management  Interventions: . [employer]. Patient has been APPROVED for [facility] assistance 05/25/19-03/11/20. Order was sent to the pharmacy on that date. Will follow up with patient on shipping and reordering at our  next scheduled phone call  Patient Self Care Activities:  . Patient will take medications as prescribed.   Please see past updates related to this goal by clicking on the "Past Updates" button in the selected goal          Plan:  - Will outreach as previously scheduled  Catie Darnelle Maffucci, PharmD, [place] [phone]

## 2019-07-21 ENCOUNTER — Ambulatory Visit (INDEPENDENT_AMBULATORY_CARE_PROVIDER_SITE_OTHER): Payer: Medicare Other | Admitting: Pharmacist

## 2019-07-21 DIAGNOSIS — E119 Type 2 diabetes mellitus without complications: Secondary | ICD-10-CM

## 2019-07-21 DIAGNOSIS — I10 Essential (primary) hypertension: Secondary | ICD-10-CM | POA: Diagnosis not present

## 2019-07-21 DIAGNOSIS — I420 Dilated cardiomyopathy: Secondary | ICD-10-CM

## 2019-07-21 NOTE — Chronic Care Management (AMB) (Signed)
Chronic Care Management   Follow Up Note   07/21/2019 Name: Samuel Morris MRN: [id number] DOB: 13-Jun-1945  Referred by: Valerie Roys, DO Reason for referral : Chronic Care Management (Medication Managemnet)   Samuel Morris is a 74 y.o. year old male who is a primary care patient of Valerie Roys, DO. The CCM team was consulted for assistance with chronic disease management and care coordination needs.    Contacted patient for medication management review.   Review of patient status, including review of consultants reports, relevant laboratory and other test results, and collaboration with appropriate care team members and the patient's provider was performed as part of comprehensive patient evaluation and provision of chronic care management services.    SDOH (Social Determinants of Health) assessments performed: No See Care Plan activities for detailed interventions related to [facility])     Outpatient Encounter Medications as of 07/21/2019  Medication Sig Note  . Ascorbic Acid (VITAMIN C PO) Take 100 mg by mouth daily.   Marland Kitchen aspirin EC 81 MG tablet Take by mouth.   . Calcium-Magnesium 100-50 MG TABS Take by mouth.   . Cholecalciferol (VITAMIN D-1000 MAX ST) 25 MCG (1000 UT) tablet Take by mouth.   . ENTRESTO 49-51 MG TK 1 T PO BID   . Ertugliflozin L-PyroglutamicAc (STEGLATRO) 5 MG TABS Take 5 mg by mouth daily. Per Endo Dr. Lance Muss   . ezetimibe (ZETIA) 10 MG tablet Take 1 tablet (10 mg total) by mouth daily.   Marland Kitchen GLUCOSAMINE-CHONDROITIN PO Take 1 tablet by mouth daily.   . metoprolol succinate (TOPROL-XL) 100 MG 24 hr tablet Take 1.5 tabs daily. Take with or immediately following a meal. 05/19/2019: Only taking 1 tablet daily  . rosuvastatin (CRESTOR) 20 MG tablet Take 20 mg by mouth at bedtime.   Marland Kitchen spironolactone (ALDACTONE) 25 MG tablet TK 1 T PO QD   . vitamin B-12 (CYANOCOBALAMIN) 1000 MCG tablet Take by mouth.   . [DISCONTINUED] FARXIGA 5 MG TABS tablet Take 5 mg by mouth  daily. 05/19/2019: Still taking Steglatro 5 mg daily right now  . nitroGLYCERIN (NITROSTAT) 0.4 MG SL tablet Place 0.4 mg under the tongue every 5 (five) minutes as needed for chest pain.    No facility-administered encounter medications on file as of 07/21/2019.     Objective:   Goals Addressed            This Visit's Progress     Patient Stated   . "I can't afford my medication" (pt-stated)       CARE PLAN ENTRY (see longtitudinal plan of care for additional care plan information)  Current Barriers:  . Financial Barriers: resolved, approved for Entresto assistance through 03/11/20 . Polypharmacy; complex patient with multiple comorbidities including CAD hx STEMI, aortic stenosis hx valve replacement, CHF, ICD, HLD, T2DM; follows w/ Dr. Alva Garnet w/ [facility] for cardiology and Dr. Lance Muss w/ [facility] for endocrinology  . Patient was to have labwork scheduled per Dr. Wynetta Emery after his February appointment, but he notes today that he had everything checked at his endocrinologist's office. Does not appear that we have ever received this results, nor that they are available on CareEverywhere. Patient reports he had asked the endocrinology office to send to [place] o CAD/HLD: rosuvastatin 20 mg daily, ezetimibe 10 mg daily; due for lipid panel s/p recent statin changes o CHF: Entresto 49/51 mg BID, metoprolol succinate 100 mg daily; spironolactone 25 mg dialy - BP at home <130/80; denies dizziness/lightheadedness unless  he stands up too quickly while gardening o T2DM: Steglatro 5 mg daily per endocrinology. Reports A1c was recently checked by her office, along with other chronic lab results Dr. Wynetta Emery had wanted   Pharmacist Clinical Goal(s):  Marland Kitchen Over the next 90 days, patient will work with PharmD and providers for optimized medication management  Interventions: . Comprehensive medication review performed, medication list updated in electronic medical record . Inter-disciplinary  care team collaboration (see longitudinal plan of care) . Encouraged patient to call endocrinology office and request his recent lab work to be faxed to our office. Will alert Dr. Wynetta Emery that these should be coming. Patient also plans to log onto the patient portal for her office and print out results to bring by for [place].  . Reviewed goal BP. Praised patient for maintenance of goal BP. Encouraged to stand up slowly and be sure to remain hydrated if he is outside active in the garden to reduce risk of falls.  Patient Self Care Activities:  . Patient will take medications as prescribed.   Please see past updates related to this goal by clicking on the "Past Updates" button in the selected goal          Plan:  - Scheduled f/u call in ~12 weeks  Catie Darnelle Maffucci, PharmD, [place] [phone]

## 2019-07-21 NOTE — Patient Instructions (Signed)
Visit Information  Goals Addressed            This Visit's Progress     Patient Stated   . "I can't afford my medication" (pt-stated)       CARE PLAN ENTRY (see longtitudinal plan of care for additional care plan information)  Current Barriers:  . Financial Barriers: resolved, approved for Entresto assistance through 03/11/20 . Polypharmacy; complex patient with multiple comorbidities including CAD hx STEMI, aortic stenosis hx valve replacement, CHF, ICD, HLD, T2DM; follows w/ Dr. Alva Garnet w/ [facility] for cardiology and Dr. Lance Muss w/ [facility] for endocrinology  . Patient was to have labwork scheduled per Dr. Wynetta Emery after his February appointment, but he notes today that he had everything checked at his endocrinologist's office. Does not appear that we have ever received this results, nor that they are available on CareEverywhere. Patient reports he had asked the endocrinology office to send to [place] o CAD/HLD: rosuvastatin 20 mg daily, ezetimibe 10 mg daily; due for lipid panel s/p recent statin changes o CHF: Entresto 49/51 mg BID, metoprolol succinate 100 mg daily; spironolactone 25 mg dialy - BP at home <130/80; denies dizziness/lightheadedness unless he stands up too quickly while gardening o T2DM: Steglatro 5 mg daily per endocrinology. Reports A1c was recently checked by her office, along with other chronic lab results Dr. Wynetta Emery had wanted   Pharmacist Clinical Goal(s):  Marland Kitchen Over the next 90 days, patient will work with PharmD and providers for optimized medication management  Interventions: . Comprehensive medication review performed, medication list updated in electronic medical record . Inter-disciplinary care team collaboration (see longitudinal plan of care) . Encouraged patient to call endocrinology office and request his recent lab work to be faxed to our office. Will alert Dr. Wynetta Emery that these should be coming. Patient also plans to log onto the patient portal  for her office and print out results to bring by for [place].  . Reviewed goal BP. Praised patient for maintenance of goal BP. Encouraged to stand up slowly and be sure to remain hydrated if he is outside active in the garden to reduce risk of falls.  Patient Self Care Activities:  . Patient will take medications as prescribed.   Please see past updates related to this goal by clicking on the "Past Updates" button in the selected goal         Patient verbalizes understanding of instructions provided today.   Plan:  - Scheduled f/u call in ~12 weeks  Catie Darnelle Maffucci, PharmD, [place] [phone]

## 2019-09-02 ENCOUNTER — Telehealth: Payer: Self-pay | Admitting: Family Medicine

## 2019-09-02 NOTE — Telephone Encounter (Signed)
Epifania Gore pt wife called today to cancel; her own appt as her husband( pt )has had a cardiac event.  Gwenette Greet wanted note to be sent to Dr  Lenna Sciara as to her husband's event. He is [facility] from [facility]  and has seen his cardiologist and also has FU with him. Will FU with her little later when they get settled.

## 2019-09-10 ENCOUNTER — Inpatient Hospital Stay: Payer: Medicare Other | Admitting: Family Medicine

## 2019-09-10 ENCOUNTER — Other Ambulatory Visit: Payer: Self-pay

## 2019-09-10 ENCOUNTER — Encounter: Payer: Self-pay | Admitting: Family Medicine

## 2019-09-10 ENCOUNTER — Ambulatory Visit (INDEPENDENT_AMBULATORY_CARE_PROVIDER_SITE_OTHER): Payer: Medicare Other | Admitting: Family Medicine

## 2019-09-10 VITALS — BP 138/78 | HR 61 | Temp 98.3°F | Ht 65.35 in | Wt 190.4 lb

## 2019-09-10 DIAGNOSIS — E1169 Type 2 diabetes mellitus with other specified complication: Secondary | ICD-10-CM | POA: Diagnosis not present

## 2019-09-10 DIAGNOSIS — Z95810 Presence of automatic (implantable) cardiac defibrillator: Secondary | ICD-10-CM

## 2019-09-10 DIAGNOSIS — I209 Angina pectoris, unspecified: Secondary | ICD-10-CM

## 2019-09-10 DIAGNOSIS — R3911 Hesitancy of micturition: Secondary | ICD-10-CM

## 2019-09-10 DIAGNOSIS — I10 Essential (primary) hypertension: Secondary | ICD-10-CM

## 2019-09-10 DIAGNOSIS — I2510 Atherosclerotic heart disease of native coronary artery without angina pectoris: Secondary | ICD-10-CM

## 2019-09-10 DIAGNOSIS — E119 Type 2 diabetes mellitus without complications: Secondary | ICD-10-CM

## 2019-09-10 DIAGNOSIS — E785 Hyperlipidemia, unspecified: Secondary | ICD-10-CM

## 2019-09-10 DIAGNOSIS — I420 Dilated cardiomyopathy: Secondary | ICD-10-CM

## 2019-09-10 LAB — UA/M W/RFLX CULTURE, ROUTINE
Bilirubin, UA: NEGATIVE
Ketones, UA: NEGATIVE
Leukocytes,UA: NEGATIVE
Nitrite, UA: NEGATIVE
Protein,UA: NEGATIVE
RBC, UA: NEGATIVE
Specific Gravity, UA: 1.02 (ref 1.005–1.030)
Urobilinogen, Ur: 0.2 mg/dL (ref 0.2–1.0)
pH, UA: 5.5 (ref 5.0–7.5)

## 2019-09-10 LAB — BAYER DCA HB A1C WAIVED: HB A1C (BAYER DCA - WAIVED): 5.6 % (ref <7.0)

## 2019-09-10 LAB — MICROALBUMIN, URINE WAIVED
Creatinine, Urine Waived: 50 mg/dL (ref 10–300)
Microalb, Ur Waived: 10 mg/L (ref 0–19)
Microalb/Creat Ratio: <30 mg/g (ref <30)

## 2019-09-10 MED ORDER — NITROGLYCERIN 0.4 MG SL SUBL: 0.4000 mg | SUBLINGUAL_TABLET | SUBLINGUAL | 12 refills | Status: DC | PRN

## 2019-09-10 MED ORDER — METOPROLOL SUCCINATE ER 100 MG PO TB24: ORAL_TABLET | ORAL | 1 refills | Status: DC

## 2019-09-10 MED ORDER — STEGLATRO 5 MG PO TABS: 5.0000 mg | ORAL_TABLET | Freq: Every day | ORAL | 1 refills | Status: DC

## 2019-09-10 NOTE — Progress Notes (Signed)
BP 138/78 (BP Location: Left Arm, Cuff Size: Normal)    Pulse 61    Temp 98.3 F (36.8 C) (Oral)    Ht 5' 5.35" (1.66 m)    Wt 190 lb 6.4 oz (86.4 kg)    SpO2 97%    BMI 31.34 kg/m    Subjective:    Patient ID: Samuel Morris, male    DOB: 1945/05/27, 73 y.o.   MRN: [id number]  HPI: Samuel Morris is a 74 y.o. male  Chief Complaint  Patient presents with   Hypertension   Hyperlipidemia   Diabetes   Le presents today after his implanted ICD went off last week. He was digging in a ditch and got dizzy and passed out. He did not go to the ER. Saw the cardiologist the next day. On review of his monitor he had a run of VT which has not repeated itself. Since that, he has been feeling well. No chest pain. No SOB or dizziness.   HYPERTENSION / HYPERLIPIDEMIA Satisfied with current treatment? yes Duration of hypertension: chronic BP monitoring frequency: rarely BP medication side effects: no Past BP meds: spironalactone, metoprolol,  Duration of hyperlipidemia: chronic Cholesterol medication side effects: no Cholesterol supplements: none Past cholesterol medications: crestor Medication compliance: excellent compliance Aspirin: no Recent stressors: no Recurrent headaches: no Visual changes: no Palpitations: no Dyspnea: no Chest pain: no Lower extremity edema: no Dizzy/lightheaded: no  DIABETES Hypoglycemic episodes:no Polydipsia/polyuria: no Visual disturbance: no Chest pain: no Paresthesias: no Glucose Monitoring: yes  Accucheck frequency: occasionally  Fasting glucose: 100 Taking Insulin?: no Blood Pressure Monitoring: rarely Retinal Examination: Not up to Date Foot Exam: Up to Date Diabetic Education: Completed Pneumovax: Up to Date Influenza: Up to Date Aspirin: yes  Relevant past medical, surgical, family and social history reviewed and updated as indicated. Interim medical history since our last visit reviewed. Allergies and medications reviewed and  updated.  Review of Systems  Constitutional: Negative.   HENT: Negative.   Respiratory: Positive for cough. Negative for apnea, choking, chest tightness, shortness of breath, wheezing and stridor.   Cardiovascular: Negative.   Gastrointestinal: Negative.   Musculoskeletal: Negative.   Psychiatric/Behavioral: Negative.     Per HPI unless specifically indicated above     Objective:    BP 138/78 (BP Location: Left Arm, Cuff Size: Normal)    Pulse 61    Temp 98.3 F (36.8 C) (Oral)    Ht 5' 5.35" (1.66 m)    Wt 190 lb 6.4 oz (86.4 kg)    SpO2 97%    BMI 31.34 kg/m   Wt Readings from Last 3 Encounters:  09/10/19 190 lb 6.4 oz (86.4 kg)  05/08/19 181 lb (82.1 kg)  04/01/19 180 lb (81.6 kg)    Physical Exam Vitals and nursing note reviewed.  Constitutional:      General: He is not in acute distress.    Appearance: Normal appearance. He is not ill-appearing, toxic-appearing or diaphoretic.  HENT:     Head: Normocephalic and atraumatic.     Right Ear: External ear normal.     Left Ear: External ear normal.     Nose: Nose normal.     Mouth/Throat:     Mouth: Mucous membranes are moist.     Pharynx: Oropharynx is clear.  Eyes:     General: No scleral icterus.       Right eye: No discharge.        Left eye: No discharge.     Extraocular  Movements: Extraocular movements intact.     Conjunctiva/sclera: Conjunctivae normal.     Pupils: Pupils are equal, round, and reactive to light.  Cardiovascular:     Rate and Rhythm: Normal rate and regular rhythm.     Pulses: Normal pulses.     Heart sounds: Normal heart sounds. No murmur heard.  No friction rub. No gallop.   Pulmonary:     Effort: Pulmonary effort is normal. No respiratory distress.     Breath sounds: Normal breath sounds. No stridor. No wheezing, rhonchi or rales.  Chest:     Chest wall: No tenderness.  Musculoskeletal:        General: Normal range of motion.     Cervical back: Normal range of motion and neck supple.   Skin:    General: Skin is warm and dry.     Capillary Refill: Capillary refill takes less than 2 seconds.     Coloration: Skin is not jaundiced or pale.     Findings: No bruising, erythema, lesion or rash.  Neurological:     General: No focal deficit present.     Mental Status: He is alert and oriented to person, place, and time. Mental status is at baseline.  Psychiatric:        Mood and Affect: Mood normal.        Behavior: Behavior normal.        Thought Content: Thought content normal.        Judgment: Judgment normal.     Results for orders placed or performed in visit on 09/10/19  UA/M w/rflx Culture, Routine   Specimen: Urine   Urine  Result Value Ref Range   Specific Gravity, UA 1.020 1.005 - 1.030   pH, UA 5.5 5.0 - 7.5   Color, UA Yellow Yellow   Appearance Ur Clear Clear   Leukocytes,UA Negative Negative   Protein,UA Negative Negative/Trace   Glucose, UA 3+ (A) Negative   Ketones, UA Negative Negative   RBC, UA Negative Negative   Bilirubin, UA Negative Negative   Urobilinogen, Ur 0.2 0.2 - 1.0 mg/dL   Nitrite, UA Negative Negative  Microalbumin, Urine Waived  Result Value Ref Range   Microalb, Ur Waived 10 0 - 19 mg/L   Creatinine, Urine Waived 50 10 - 300 mg/dL   Microalb/Creat Ratio <30 <30 mg/g  Bayer DCA Hb A1c Waived  Result Value Ref Range   HB A1C (BAYER DCA - WAIVED) 5.6 <7.0 %      Assessment & Plan:   Problem List Items Addressed This Visit      Cardiovascular and Mediastinum   CAD (coronary artery disease)    Follows with cardiology. Continue to monitor. Call with any concerns.       Relevant Medications   metoprolol succinate (TOPROL-XL) 100 MG 24 hr tablet   nitroGLYCERIN (NITROSTAT) 0.4 MG SL tablet   HTN (hypertension)    Under good control on current regimen. Continue current regimen. Continue to monitor. Call with any concerns. Refills given. Labs drawn today.       Relevant Medications   metoprolol succinate (TOPROL-XL) 100  MG 24 hr tablet   nitroGLYCERIN (NITROSTAT) 0.4 MG SL tablet   Angina pectoris ([place])    Follows with cardiology. Continue to monitor. Call with any concerns.       Relevant Medications   metoprolol succinate (TOPROL-XL) 100 MG 24 hr tablet   nitroGLYCERIN (NITROSTAT) 0.4 MG SL tablet   Dilated cardiomyopathy (HCC)  Follows with cardiology. Continue to monitor. Call with any concerns.       Relevant Medications   metoprolol succinate (TOPROL-XL) 100 MG 24 hr tablet   nitroGLYCERIN (NITROSTAT) 0.4 MG SL tablet     Endocrine   Hyperlipidemia associated with type 2 diabetes mellitus ([place])    Under good control on current regimen. Continue current regimen. Continue to monitor. Call with any concerns. Refills given. Labs drawn today.       Relevant Medications   ertugliflozin L-PyroglutamicAc (STEGLATRO) 5 MG TABS tablet   Diabetes mellitus without complication ([place]) - Primary    Under good control. Continue steglatro. Continue to monitor. Call with any concerns. Labs drawn today      Relevant Medications   ertugliflozin L-PyroglutamicAc (STEGLATRO) 5 MG TABS tablet     Other   Presence of automatic (implantable) cardiac defibrillator    Recent shock. Following with cardiology. Call with any concerns.        Other Visit Diagnoses    Hesitancy       Labs drawn today. Await results.        Follow up plan: Return in about 6 months (around 03/12/2020).

## 2019-09-10 NOTE — Assessment & Plan Note (Signed)
Under good control. Continue steglatro. Continue to monitor. Call with any concerns. Labs drawn today

## 2019-09-10 NOTE — Assessment & Plan Note (Signed)
Follows with cardiology. Continue to monitor. Call with any concerns.

## 2019-09-10 NOTE — Assessment & Plan Note (Signed)
Under good control on current regimen. Continue current regimen. Continue to monitor. Call with any concerns. Refills given. Labs drawn today.   

## 2019-09-10 NOTE — Assessment & Plan Note (Signed)
Recent shock. Following with cardiology. Call with any concerns.

## 2019-09-11 LAB — COMPREHENSIVE METABOLIC PANEL
ALT: 20 IU/L (ref 0–44)
AST: 20 IU/L (ref 0–40)
Albumin/Globulin Ratio: 1.9 (ref 1.2–2.2)
Albumin: 4.5 g/dL (ref 3.7–4.7)
Alkaline Phosphatase: 100 IU/L (ref 48–121)
BUN/Creatinine Ratio: 17 (ref 10–24)
BUN: 16 mg/dL (ref 8–27)
Bilirubin Total: 0.7 mg/dL (ref 0.0–1.2)
CO2: 20 mmol/L (ref 20–29)
Calcium: 9.6 mg/dL (ref 8.6–10.2)
Chloride: 101 mmol/L (ref 96–106)
Creatinine, Ser: 0.95 mg/dL (ref 0.76–1.27)
GFR calc Af Amer: 91 mL/min/{1.73_m2} (ref >59)
GFR calc non Af Amer: 79 mL/min/{1.73_m2} (ref >59)
Globulin, Total: 2.4 g/dL (ref 1.5–4.5)
Glucose: 110 mg/dL — ABNORMAL HIGH (ref 65–99)
Potassium: 4.9 mmol/L (ref 3.5–5.2)
Sodium: 137 mmol/L (ref 134–144)
Total Protein: 6.9 g/dL (ref 6.0–8.5)

## 2019-09-11 LAB — LIPID PANEL W/O CHOL/HDL RATIO
Cholesterol, Total: 131 mg/dL (ref 100–199)
HDL: 50 mg/dL (ref >39)
LDL Chol Calc (NIH): 62 mg/dL (ref 0–99)
Triglycerides: 100 mg/dL (ref 0–149)
VLDL Cholesterol Cal: 19 mg/dL (ref 5–40)

## 2019-09-11 LAB — PSA: Prostate Specific Ag, Serum: 2.1 ng/mL (ref 0.0–4.0)

## 2019-09-15 ENCOUNTER — Telehealth: Payer: Self-pay

## 2019-09-15 ENCOUNTER — Ambulatory Visit: Admit: 2019-09-15 | Payer: Self-pay | Admitting: Ophthalmology

## 2019-09-15 SURGERY — PHACOEMULSIFICATION, CATARACT, WITH IOL INSERTION
Anesthesia: Topical | Laterality: Right

## 2019-09-15 NOTE — Telephone Encounter (Signed)
Received fax from pharmacy requesting PA for Steglatro 5MG  Tablets.   Contacted pt to obtain insurance information. Pt requested to send [place] with information later.

## 2019-09-16 LAB — HM DIABETES EYE EXAM

## 2019-10-14 ENCOUNTER — Telehealth: Payer: Self-pay

## 2019-10-28 LAB — HM DIABETES EYE EXAM

## 2019-11-17 ENCOUNTER — Ambulatory Visit: Payer: Medicare Other | Admitting: Family Medicine

## 2020-01-08 ENCOUNTER — Other Ambulatory Visit: Payer: Self-pay | Admitting: Family Medicine

## 2020-01-08 NOTE — Telephone Encounter (Signed)
Requested medication (s) are due for refill today: yes  Requested medication (s) are on the active medication list: yes   Last refill:  09/15/2019  Future visit scheduled: yes   Notes to clinic:  medication filled by a historical provider  Review for refill    Requested Prescriptions  Pending Prescriptions Disp Refills   spironolactone (ALDACTONE) 25 MG tablet [Pharmacy Med Name: SPIRONOLACTONE 25MG  TABLETS] 90 tablet     Sig: TAKE 1 TABLET BY MOUTH EVERY DAY      Cardiovascular: Diuretics - Aldosterone Antagonist Passed - 01/08/2020  9:54 AM      Passed - Cr in normal range and within 360 days    Creatinine, Ser  Date Value Ref Range Status  09/10/2019 0.95 0.76 - 1.27 mg/dL Final          Passed - K in normal range and within 360 days    Potassium  Date Value Ref Range Status  09/10/2019 4.9 3.5 - 5.2 mmol/L Final          Passed - Na in normal range and within 360 days    Sodium  Date Value Ref Range Status  09/10/2019 137 134 - 144 mmol/L Final          Passed - Last BP in normal range    BP Readings from Last 1 Encounters:  09/10/19 138/78          Passed - Valid encounter within last 6 months    Recent Outpatient Visits           4 months ago Diabetes mellitus without complication ([place])   [place], Megan P, DO   8 months ago Hyperlipidemia associated with type 2 diabetes mellitus ([place])   [place], Megan P, DO   1 year ago Acute maxillary sinusitis, recurrence not specified   [facility] Vol[place] American, PA-C   1 year ago Essential hypertension   [place], [place], DO   1 year ago Dilated cardiomyopathy [facility])   [place], Megan P, DO       Future Appointments             In 2 months Johnson, Barb Merino, DO [place], [place]   In 2 months  [employer], [facility]

## 2020-01-08 NOTE — Telephone Encounter (Signed)
Patient last seen 09/10/19 and has appointment 03/15/20.

## 2020-03-15 ENCOUNTER — Ambulatory Visit: Payer: Medicare Other | Admitting: Family Medicine

## 2020-04-01 ENCOUNTER — Telehealth: Payer: Self-pay

## 2020-04-01 ENCOUNTER — Ambulatory Visit (INDEPENDENT_AMBULATORY_CARE_PROVIDER_SITE_OTHER): Payer: Medicare Other

## 2020-04-01 VITALS — Ht 68.0 in | Wt 189.0 lb

## 2020-04-01 DIAGNOSIS — Z0000 Encounter for general adult medical examination without abnormal findings: Secondary | ICD-10-CM | POA: Diagnosis not present

## 2020-04-01 NOTE — Progress Notes (Signed)
I connected with Samuel Morris today by telephone and verified that I am speaking with the correct person using two identifiers. Location patient: home Location provider: work Persons participating in the virtual visit: Samuel Morris, Glenna Durand LPN.   I discussed the limitations, risks, security and privacy concerns of performing an evaluation and management service by telephone and the availability of in person appointments. I also discussed with the patient that there may be a patient responsible charge related to this service. The patient expressed understanding and verbally consented to this telephonic visit.    Interactive audio and video telecommunications were attempted between this provider and patient, however failed, due to patient having technical difficulties OR patient did not have access to video capability.  We continued and completed visit with audio only.     Vital signs may be patient reported or missing.  Subjective:   Samuel Morris is a 75 y.o. male who presents for Medicare Annual/Subsequent preventive examination.  Review of Systems     Cardiac Risk Factors include: advanced age (>62men, >58 women);dyslipidemia;hypertension;male gender;diabetes mellitus     Objective:    Today's Vitals   04/01/20 0857  Weight: 189 lb (85.7 kg)  Height: 5\' 8"  (1.727 m)   Body mass index is 28.74 kg/m.  Advanced Directives 04/01/2020 04/01/2019  Does Patient Have a Medical Advance Directive? Yes Yes  Type of Paramedic of [place];Living will Living will;Healthcare Power of [place] in Chart? No - copy requested No - copy requested    Current Medications (verified) Outpatient Encounter Medications as of 04/01/2020  Medication Sig  . Ascorbic Acid (VITAMIN C PO) Take 100 mg by mouth daily.  Marland Kitchen aspirin EC 81 MG tablet Take by mouth.  . Calcium-Magnesium 100-50 MG TABS Take by mouth.  . Cholecalciferol (VITAMIN  D-1000 MAX ST) 25 MCG (1000 UT) tablet Take by mouth.  . ENTRESTO 49-51 MG TK 1 T PO BID  . ertugliflozin L-PyroglutamicAc (STEGLATRO) 5 MG TABS tablet Take 1 tablet (5 mg total) by mouth daily.  Marland Kitchen ezetimibe (ZETIA) 10 MG tablet Take 1 tablet (10 mg total) by mouth daily.  Marland Kitchen GLUCOSAMINE-CHONDROITIN PO Take 1 tablet by mouth daily.  . metoprolol succinate (TOPROL-XL) 100 MG 24 hr tablet Take 1.5 tabs daily. Take with or immediately following a meal.  . nitroGLYCERIN (NITROSTAT) 0.4 MG SL tablet Place 1 tablet (0.4 mg total) under the tongue every 5 (five) minutes as needed for chest pain.  . rosuvastatin (CRESTOR) 20 MG tablet Take 20 mg by mouth at bedtime.  Marland Kitchen spironolactone (ALDACTONE) 25 MG tablet TAKE 1 TABLET BY MOUTH EVERY DAY  . vitamin B-12 (CYANOCOBALAMIN) 1000 MCG tablet Take by mouth.   No facility-administered encounter medications on file as of 04/01/2020.    Allergies (verified) Patient has no known allergies.   History: Past Medical History:  Diagnosis Date  . Cancer (HCC)    Scalp  . Cataract   . Diabetes mellitus without complication ([place])   . Heart attack ([place])   . Heart disease   . Hyperlipidemia   . Hypertension   . Sleep apnea   . Stenosis of aorta    Past Surgical History:  Procedure Laterality Date  . CARDIAC DEFIBRILLATOR PLACEMENT    . CARDIAC SURGERY     Family History  Problem Relation Age of Onset  . Heart disease Mother   . Heart attack Father   . Arthritis Sister   . Hypertension  Sister   . Hypertension Brother   . Heart disease Paternal Grandmother   . Heart murmur Sister    Social History   Socioeconomic History  . Marital status: Married    Spouse name: Not on file  . Number of children: Not on file  . Years of education: Not on file  . Highest education level: Not on file  Occupational History  . Occupation: retired  Tobacco Use  . Smoking status: Never Smoker  . Smokeless tobacco: Never Used  Vaping Use  . Vaping Use: Never  used  Substance and Sexual Activity  . Alcohol use: Yes    Comment: On occasion  . Drug use: Never  . Sexual activity: Yes    Birth control/protection: None  Other Topics Concern  . Not on file  Social History Narrative  . Not on file   Social Determinants of Health   Financial Resource Strain: Low Risk   . Difficulty of Paying Living Expenses: Not hard at all  Food Insecurity: No Food Insecurity  . Worried About Charity fundraiser in the Last Year: Never true  . Ran Out of Food in the Last Year: Never true  Transportation Needs: No Transportation Needs  . Lack of Transportation (Medical): No  . Lack of Transportation (Non-Medical): No  Physical Activity: Inactive  . Days of Exercise per Week: 0 days  . Minutes of Exercise per Session: 0 min  Stress: No Stress Concern Present  . Feeling of Stress : Not at all  Social Connections: Not on file    Tobacco Counseling Counseling given: Not Answered   Clinical Intake:  Pre-visit preparation completed: Yes  Pain : No/denies pain     Nutritional Status: BMI 25 -29 Overweight Nutritional Risks: None Diabetes: Yes  How often do you need to have someone help you when you read instructions, pamphlets, or other written materials from your doctor or pharmacy?: 1 - Never What is the last grade level you completed in school?: engineering degree  Diabetic? Yes Nutrition Risk Assessment:  Has the patient had any N/V/D within the last 2 months?  No  Does the patient have any non-healing wounds?  No  Has the patient had any unintentional weight loss or weight gain?  No   Diabetes:  Is the patient diabetic?  Yes  If diabetic, was a CBG obtained today?  No  Did the patient bring in their glucometer from home?  No  How often do you monitor your CBG's? Every other day.   Financial Strains and Diabetes Management:  Are you having any financial strains with the device, your supplies or your medication? No .  Does the patient  want to be seen by Chronic Care Management for management of their diabetes?  No  Would the patient like to be referred to a Nutritionist or for Diabetic Management?  No   Diabetic Exams:  Diabetic Eye Exam: Completed 10/28/2019 Diabetic Foot Exam: Completed 09/10/2019   Interpreter Needed?: No  Information entered by :: NAllen LPN   Activities of Daily Living In your present state of health, do you have any difficulty performing the following activities: 04/01/2020  Hearing? N  Vision? N  Difficulty concentrating or making decisions? N  Walking or climbing stairs? N  Dressing or bathing? N  Doing errands, shopping? N  Preparing Food and eating ? N  Using the Toilet? N  In the past six months, have you accidently leaked urine? N  Do you have problems  with loss of bowel control? N  Managing your Medications? N  Managing your Finances? N  Housekeeping or managing your Housekeeping? N  Some recent data might be hidden    Patient Care Team: Valerie Roys, DO as PCP - General (Family Medicine)  Indicate any recent Medical Services you may have received from other than [facility] providers in the past year (date may be approximate).     Assessment:   This is a routine wellness examination for [place].  Hearing/Vision screen  Hearing Screening   125Hz  250Hz  500Hz  1000Hz  2000Hz  3000Hz  4000Hz  6000Hz  8000Hz   Right ear:           Left ear:           Vision Screening Comments: Regular eye exams, Dr. Herbert Deaner  Dietary issues and exercise activities discussed: Current Exercise Habits: The patient does not participate in regular exercise at present (works in garden)  Goals    .  "I can't afford my medication" (pt-stated)      CARE PLAN ENTRY (see longtitudinal plan of care for additional care plan information)  Current Barriers:  . Financial Barriers: resolved, approved for Entresto assistance through 03/11/20 . Polypharmacy; complex patient with multiple comorbidities including CAD  hx STEMI, aortic stenosis hx valve replacement, CHF, ICD, HLD, T2DM; follows w/ Dr. Alva Garnet w/ [place] for cardiology and Dr. Lance Muss w/ [facility] for endocrinology  . Patient was to have labwork scheduled per Dr. Wynetta Emery after his February appointment, but he notes today that he had everything checked at his endocrinologist's office. Does not appear that we have ever received this results, nor that they are available on CareEverywhere. Patient reports he had asked the endocrinology office to send to [place] o CAD/HLD: rosuvastatin 20 mg daily, ezetimibe 10 mg daily; due for lipid panel s/p recent statin changes o CHF: Entresto 49/51 mg BID, metoprolol succinate 100 mg daily; spironolactone 25 mg dialy - BP at home <130/80; denies dizziness/lightheadedness unless he stands up too quickly while gardening o T2DM: Steglatro 5 mg daily per endocrinology. Reports A1c was recently checked by her office, along with other chronic lab results Dr. Wynetta Emery had wanted   Pharmacist Clinical Goal(s):  Marland Kitchen Over the next 90 days, patient will work with PharmD and providers for optimized medication management  Interventions: . Comprehensive medication review performed, medication list updated in electronic medical record . Inter-disciplinary care team collaboration (see longitudinal plan of care) . Encouraged patient to call endocrinology office and request his recent lab work to be faxed to our office. Will alert Dr. Wynetta Emery that these should be coming. Patient also plans to log onto the patient portal for her office and print out results to bring by for [place].  . Reviewed goal BP. Praised patient for maintenance of goal BP. Encouraged to stand up slowly and be sure to remain hydrated if he is outside active in the garden to reduce risk of falls.  Patient Self Care Activities:  . Patient will take medications as prescribed.   Please see past updates related to this goal by clicking on the "Past Updates"  button in the selected goal      .  Patient Stated      04/01/2020, stay healthy      Depression Screen PHQ 2/9 Scores 04/01/2020 04/01/2019 11/05/2018  PHQ - 2 Score 0 0 0    Fall Risk Fall Risk  04/01/2020 04/01/2019 11/05/2018  Falls in the past year? 0 0 0  Number falls  in past yr: - 0 0  Injury with Fall? - 0 0  Risk for fall due to : Medication side effect - -  Follow up Falls evaluation completed;Education provided;Falls prevention discussed - -    FALL RISK PREVENTION PERTAINING TO THE HOME:  Any stairs in or around the home? Yes  If so, are there any without handrails? No  Home free of loose throw rugs in walkways, pet beds, electrical cords, etc? Yes  Adequate lighting in your home to reduce risk of falls? Yes   ASSISTIVE DEVICES UTILIZED TO PREVENT FALLS:  Life alert? No  Use of a cane, walker or w/c? No  Grab bars in the bathroom? Yes  Shower chair or bench in shower? Yes  Elevated toilet seat or a handicapped toilet? Yes   TIMED UP AND GO:  Was the test performed? No .    Cognitive Function:     6CIT Screen 04/01/2020  What Year? 0 points  What month? 0 points  What time? 0 points  Count back from 20 0 points  Months in reverse 0 points  Repeat phrase 2 points  Total Score 2    Immunizations Immunization History  Administered Date(s) Administered  . Fluad Quad(high Dose 65+) 11/06/2018  . Influenza, High Dose Seasonal PF 12/11/2016, 03/14/2018  . PFIZER(Purple Top)SARS-COV-2 Vaccination 05/04/2019, 05/29/2019  . Pneumococcal Conjugate-13 12/14/2013  . Pneumococcal Polysaccharide-23 12/01/2014    TDAP status: Due, Education has been provided regarding the importance of this vaccine. Advised may receive this vaccine at local pharmacy or Health Dept. Aware to provide a copy of the vaccination record if obtained from local pharmacy or Health Dept. Verbalized acceptance and understanding.  Flu Vaccine status: Up to date  Pneumococcal vaccine status:  Up to date  Covid-19 vaccine status: Completed vaccines  Qualifies for Shingles Vaccine? Yes   Zostavax completed No   Shingrix Completed?: No.    Education has been provided regarding the importance of this vaccine. Patient has been advised to call insurance company to determine out of pocket expense if they have not yet received this vaccine. Advised may also receive vaccine at local pharmacy or Health Dept. Verbalized acceptance and understanding.  Screening Tests Health Maintenance  Topic Date Due  . TETANUS/TDAP  Never done  . COLONOSCOPY (Pts 45-48yrs Insurance coverage will need to be confirmed)  Never done  . INFLUENZA VACCINE  10/11/2019  . COVID-19 Vaccine (3 - Booster for Pfizer series) 11/29/2019  . HEMOGLOBIN A1C  03/12/2020  . FOOT EXAM  09/09/2020  . URINE MICROALBUMIN  09/09/2020  . OPHTHALMOLOGY EXAM  10/27/2020  . Hepatitis C Screening  Completed  . PNA vac Low Risk Adult  Completed    Health Maintenance  Health Maintenance Due  Topic Date Due  . TETANUS/TDAP  Never done  . COLONOSCOPY (Pts 45-65yrs Insurance coverage will need to be confirmed)  Never done  . INFLUENZA VACCINE  10/11/2019  . COVID-19 Vaccine (3 - Booster for Pfizer series) 11/29/2019  . HEMOGLOBIN A1C  03/12/2020    Colorectal cancer screening: No longer required.   Lung Cancer Screening: (Low Dose CT Chest recommended if Age 82-80 years, 30 pack-year currently smoking OR have quit w/in 15years.) does not qualify.   Lung Cancer Screening Referral: no  Additional Screening:  Hepatitis C Screening: does qualify; Completed 11/06/2018  Vision Screening: Recommended annual ophthalmology exams for early detection of glaucoma and other disorders of the eye. Is the patient up to date with their annual  eye exam?  Yes  Who is the provider or what is the name of the office in which the patient attends annual eye exams? Dr. Herbert Deaner If pt is not established with a provider, would they like to be  referred to a provider to establish care? No .   Dental Screening: Recommended annual dental exams for proper oral hygiene  Community Resource Referral / Chronic Care Management: CRR required this visit?  No   CCM required this visit?  No      Plan:     I have personally reviewed and noted the following in the patient's chart:   . Medical and social history . Use of alcohol, tobacco or illicit drugs  . Current medications and supplements . Functional ability and status . Nutritional status . Physical activity . Advanced directives . List of other physicians . Hospitalizations, surgeries, and ER visits in previous 12 months . Vitals . Screenings to include cognitive, depression, and falls . Referrals and appointments  In addition, I have reviewed and discussed with patient certain preventive protocols, quality metrics, and best practice recommendations. A written personalized care plan for preventive services as well as general preventive health recommendations were provided to patient.     Kellie Simmering, LPN   [id number]   Nurse Notes:

## 2020-04-01 NOTE — Patient Instructions (Signed)
Samuel Morris , Thank you for taking time to come for your Medicare Wellness Visit. I appreciate your ongoing commitment to your health goals. Please review the following plan we discussed and let me know if I can assist you in the future.   Screening recommendations/referrals: Colonoscopy: due Recommended yearly ophthalmology/optometry visit for glaucoma screening and checkup Recommended yearly dental visit for hygiene and checkup  Vaccinations: Influenza vaccine: due Pneumococcal vaccine: completed 12/01/2014 Tdap vaccine: due Shingles vaccine: discussed   Covid-19:  05/29/2019, 05/04/2019  Advanced directives: Please bring a copy of your POA (Power of Attorney) and/or Living Will to your next appointment.   Conditions/risks identified: none  Next appointment: Follow up in one year for your annual wellness visit.   Preventive Care 39 Years and Older, Male Preventive care refers to lifestyle choices and visits with your health care provider that can promote health and wellness. What does preventive care include?  A yearly physical exam. This is also called an annual well check.  Dental exams once or twice a year.  Routine eye exams. Ask your health care provider how often you should have your eyes checked.  Personal lifestyle choices, including:  Daily care of your teeth and gums.  Regular physical activity.  Eating a healthy diet.  Avoiding tobacco and drug use.  Limiting alcohol use.  Practicing safe sex.  Taking low doses of aspirin every day.  Taking vitamin and mineral supplements as recommended by your health care provider. What happens during an annual well check? The services and screenings done by your health care provider during your annual well check will depend on your age, overall health, lifestyle risk factors, and family history of disease. Counseling  Your health care provider may ask you questions about your:  Alcohol use.  Tobacco use.  Drug  use.  Emotional well-being.  Home and relationship well-being.  Sexual activity.  Eating habits.  History of falls.  Memory and ability to understand (cognition).  Work and work Statistician. Screening  You may have the following tests or measurements:  Height, weight, and BMI.  Blood pressure.  Lipid and cholesterol levels. These may be checked every 5 years, or more frequently if you are over 59 years old.  Skin check.  Lung cancer screening. You may have this screening every year starting at age 95 if you have a 30-pack-year history of smoking and currently smoke or have quit within the past 15 years.  Fecal occult blood test (FOBT) of the stool. You may have this test every year starting at age 78.  Flexible sigmoidoscopy or colonoscopy. You may have a sigmoidoscopy every 5 years or a colonoscopy every 10 years starting at age 53.  Prostate cancer screening. Recommendations will vary depending on your family history and other risks.  Hepatitis C blood test.  Hepatitis B blood test.  Sexually transmitted disease (STD) testing.  Diabetes screening. This is done by checking your blood sugar (glucose) after you have not eaten for a while (fasting). You may have this done every 1-3 years.  Abdominal aortic aneurysm (AAA) screening. You may need this if you are a current or former smoker.  Osteoporosis. You may be screened starting at age 76 if you are at high risk. Talk with your health care provider about your test results, treatment options, and if necessary, the need for more tests. Vaccines  Your health care provider may recommend certain vaccines, such as:  Influenza vaccine. This is recommended every year.  Tetanus, diphtheria, and  acellular pertussis (Tdap, Td) vaccine. You may need a Td booster every 10 years.  Zoster vaccine. You may need this after age 6.  Pneumococcal 13-valent conjugate (PCV13) vaccine. One dose is recommended after age  26.  Pneumococcal polysaccharide (PPSV23) vaccine. One dose is recommended after age 52. Talk to your health care provider about which screenings and vaccines you need and how often you need them. This information is not intended to replace advice given to you by your health care provider. Make sure you discuss any questions you have with your health care provider. Document Released: 03/25/2015 Document Revised: 11/16/2015 Document Reviewed: 12/28/2014 Elsevier Interactive Patient Education  2017 [place] Prevention in the Home Falls can cause injuries. They can happen to people of all ages. There are many things you can do to make your home safe and to help prevent falls. What can I do on the outside of my home?  Regularly fix the edges of walkways and driveways and fix any cracks.  Remove anything that might make you trip as you walk through a door, such as a raised step or threshold.  Trim any bushes or trees on the path to your home.  Use bright outdoor lighting.  Clear any walking paths of anything that might make someone trip, such as rocks or tools.  Regularly check to see if handrails are loose or broken. Make sure that both sides of any steps have handrails.  Any raised decks and porches should have guardrails on the edges.  Have any leaves, snow, or ice cleared regularly.  Use sand or salt on walking paths during winter.  Clean up any spills in your garage right away. This includes oil or grease spills. What can I do in the bathroom?  Use night lights.  Install grab bars by the toilet and in the tub and shower. Do not use towel bars as grab bars.  Use non-skid mats or decals in the tub or shower.  If you need to sit down in the shower, use a plastic, non-slip stool.  Keep the floor dry. Clean up any water that spills on the floor as soon as it happens.  Remove soap buildup in the tub or shower regularly.  Attach bath mats securely with double-sided  non-slip rug tape.  Do not have throw rugs and other things on the floor that can make you trip. What can I do in the bedroom?  Use night lights.  Make sure that you have a light by your bed that is easy to reach.  Do not use any sheets or blankets that are too big for your bed. They should not hang down onto the floor.  Have a firm chair that has side arms. You can use this for support while you get dressed.  Do not have throw rugs and other things on the floor that can make you trip. What can I do in the kitchen?  Clean up any spills right away.  Avoid walking on wet floors.  Keep items that you use a lot in easy-to-reach places.  If you need to reach something above you, use a strong step stool that has a grab bar.  Keep electrical cords out of the way.  Do not use floor polish or wax that makes floors slippery. If you must use wax, use non-skid floor wax.  Do not have throw rugs and other things on the floor that can make you trip. What can I do with my stairs?  Do not leave any items on the stairs.  Make sure that there are handrails on both sides of the stairs and use them. Fix handrails that are broken or loose. Make sure that handrails are as long as the stairways.  Check any carpeting to make sure that it is firmly attached to the stairs. Fix any carpet that is loose or worn.  Avoid having throw rugs at the top or bottom of the stairs. If you do have throw rugs, attach them to the floor with carpet tape.  Make sure that you have a light switch at the top of the stairs and the bottom of the stairs. If you do not have them, ask someone to add them for you. What else can I do to help prevent falls?  Wear shoes that:  Do not have high heels.  Have rubber bottoms.  Are comfortable and fit you well.  Are closed at the toe. Do not wear sandals.  If you use a stepladder:  Make sure that it is fully opened. Do not climb a closed stepladder.  Make sure that both  sides of the stepladder are locked into place.  Ask someone to hold it for you, if possible.  Clearly mark and make sure that you can see:  Any grab bars or handrails.  First and last steps.  Where the edge of each step is.  Use tools that help you move around (mobility aids) if they are needed. These include:  Canes.  Walkers.  Scooters.  Crutches.  Turn on the lights when you go into a dark area. Replace any light bulbs as soon as they burn out.  Set up your furniture so you have a clear path. Avoid moving your furniture around.  If any of your floors are uneven, fix them.  If there are any pets around you, be aware of where they are.  Review your medicines with your doctor. Some medicines can make you feel dizzy. This can increase your chance of falling. Ask your doctor what other things that you can do to help prevent falls. This information is not intended to replace advice given to you by your health care provider. Make sure you discuss any questions you have with your health care provider. Document Released: 12/23/2008 Document Revised: 08/04/2015 Document Reviewed: 04/02/2014 Elsevier Interactive Patient Education  2017 [employer].

## 2020-04-01 NOTE — Telephone Encounter (Signed)
Patient consented to telehealth visit. 

## 2020-04-04 ENCOUNTER — Telehealth: Payer: Self-pay | Admitting: Family Medicine

## 2020-04-04 NOTE — Telephone Encounter (Signed)
Medication Refill - Medication: Ventolin inhaler and tessalon pearls  Has the patient contacted their pharmacy? No. (Agent: If no, request that the patient contact the pharmacy for the refill.) (Agent: If yes, when and what did the pharmacy advise?)  Preferred Pharmacy (with phone number or street name): *[facility]  Agent: Please be advised that RX refills may take up to 3 business days. We ask that you follow-up with your pharmacy.

## 2020-04-04 NOTE — Telephone Encounter (Signed)
Can these be sent in?

## 2020-04-04 NOTE — Telephone Encounter (Signed)
He has an appointment next week and has not been seen since July- is he sick?

## 2020-04-04 NOTE — Telephone Encounter (Signed)
Called and left a message asking what was going on with patient, and why he needed this medication. Will wait for a return call.

## 2020-04-05 NOTE — Telephone Encounter (Signed)
Patient scheduled for a virtual.

## 2020-04-06 ENCOUNTER — Encounter: Payer: Self-pay | Admitting: Family Medicine

## 2020-04-06 ENCOUNTER — Telehealth (INDEPENDENT_AMBULATORY_CARE_PROVIDER_SITE_OTHER): Payer: Medicare Other | Admitting: Family Medicine

## 2020-04-06 ENCOUNTER — Other Ambulatory Visit: Payer: Self-pay

## 2020-04-06 ENCOUNTER — Ambulatory Visit: Payer: Medicare Other

## 2020-04-06 DIAGNOSIS — J069 Acute upper respiratory infection, unspecified: Secondary | ICD-10-CM | POA: Diagnosis not present

## 2020-04-06 MED ORDER — AMOXICILLIN-POT CLAVULANATE 875-125 MG PO TABS: 1.0000 | ORAL_TABLET | Freq: Two times a day (BID) | ORAL | 0 refills | Status: AC

## 2020-04-06 MED ORDER — ALBUTEROL SULFATE HFA 108 (90 BASE) MCG/ACT IN AERS: 1.0000 | INHALATION_SPRAY | RESPIRATORY_TRACT | 0 refills | Status: DC | PRN

## 2020-04-06 MED ORDER — METHYLPREDNISOLONE 4 MG PO TBPK: ORAL_TABLET | ORAL | 0 refills | Status: DC

## 2020-04-06 NOTE — Progress Notes (Signed)
Virtual Visit via Video Note  I connected with Samuel Morris on 04/06/20 at  2:20 PM EST by a video enabled telemedicine application and verified that I am speaking with the correct person using two identifiers.  Location: Patient: home Provider: [facility]   I discussed the limitations of evaluation and management by telemedicine and the availability of in person appointments. The patient expressed understanding and agreed to proceed.  History of Present Illness:  UPPER RESPIRATORY TRACT INFECTION - symptom onset 1 week ago. - wanting albuterol and tessalon refill  - received 2 doses of Pfizer vaccine. - tested a week ago, negative.  Worst symptom: Fever: no Cough: yes, productive of mucus Shortness of breath: no Wheezing: no Chest pain: no Chest tightness: no Chest congestion: yes Nasal congestion: yes Post nasal drip: yes Sinus pressure: no Headache: no Face pain: no Toothache: no Ear pain: no  Ear pressure: no  Eyes red/itching:no Eye drainage/crusting: no  Vomiting: no Sick contacts: no Context: worse Recurrent sinusitis: no Relief with OTC cold/cough medications: yes  Treatments attempted: Nyquil, tessalon perles   Observations/Objective:  Well appearing, in NAD. Speaks in full sentences, no resp distress.   Assessment and Plan:  Viral URI With mild sx, though worsening despite OTC decongestant. Recommend retesting for COVID, patient prefers to do at-home test and will call with results. Will provide short steroid course. Will provide antibiotic with instructions to fill only if develops fever or symptom progression >14 days given prior sinusitis/bronchitis. Reviewed OTC symptom relief, self-quarantine, emergency precautions.     I discussed the assessment and treatment plan with the patient. The patient was provided an opportunity to ask questions and all were answered. The patient agreed with the plan and demonstrated an understanding of the instructions.   The  patient was advised to call back or seek an in-person evaluation if the symptoms worsen or if the condition fails to improve as anticipated.  I provided 21 minutes of non-face-to-face time during this encounter.   Myles Gip, DO

## 2020-04-06 NOTE — Patient Instructions (Signed)
It was great to see you!  Our plans for today:  - See below for self-isolation guidelines. You may end your quarantine if your test is negative, or if positive, once you are 10 days from symptom onset and fever free for 24 hours without use of tylenol or ibuprofen.  - Let us know the results of your COVID test.  - I recommend getting vaccinated with the COVID booster once you are healed from your current infection.  - Certainly, if you are having difficulties breathing or unable to keep down fluids, go to the Emergency Department.   Take care and seek immediate care sooner if you develop any concerns.   Dr. Ky Barban     Person Under Monitoring Name: Samuel Morris  Location: [place] [address]   Infection Prevention Recommendations for Individuals Confirmed to have, or Being Evaluated for, 2019 Novel Coronavirus (COVID-19) Infection Who Receive Care at Home  Individuals who are confirmed to have, or are being evaluated for, COVID-19 should follow the prevention steps below until a healthcare provider or local or state health department says they can return to normal activities.  Stay home except to get medical care You should restrict activities outside your home, except for getting medical care. Do not go to work, school, or public areas, and do not use public transportation or taxis.  Call ahead before visiting your doctor Before your medical appointment, call the healthcare provider and tell them that you have, or are being evaluated for, COVID-19 infection. This will help the healthcare provider's office take steps to keep other people from getting infected. Ask your healthcare provider to call the local or state health department.  Monitor your symptoms Seek prompt medical attention if your illness is worsening (e.g., difficulty breathing). Before going to your medical appointment, call the healthcare provider and tell them that you have, or are being  evaluated for, COVID-19 infection. Ask your healthcare provider to call the local or state health department.  Wear a facemask You should wear a facemask that covers your nose and mouth when you are in the same room with other people and when you visit a healthcare provider. People who live with or visit you should also wear a facemask while they are in the same room with you.  Separate yourself from other people in your home As much as possible, you should stay in a different room from other people in your home. Also, you should use a separate bathroom, if available.  Avoid sharing household items You should not share dishes, drinking glasses, cups, eating utensils, towels, bedding, or other items with other people in your home. After using these items, you should wash them thoroughly with soap and water.  Cover your coughs and sneezes Cover your mouth and nose with a tissue when you cough or sneeze, or you can cough or sneeze into your sleeve. Throw used tissues in a lined trash can, and immediately wash your hands with soap and water for at least 20 seconds or use an alcohol-based hand rub.  Wash your [facility] your hands often and thoroughly with soap and water for at least 20 seconds. You can use an alcohol-based hand sanitizer if soap and water are not available and if your hands are not visibly dirty. Avoid touching your eyes, nose, and mouth with unwashed hands.   Prevention Steps for Caregivers and Household Members of Individuals Confirmed to have, or Being Evaluated for, COVID-19 Infection Being Cared for in  the Home  If you live with, or provide care at home for, a person confirmed to have, or being evaluated for, COVID-19 infection please follow these guidelines to prevent infection:  Follow healthcare provider's instructions Make sure that you understand and can help the patient follow any healthcare provider instructions for all care.  Provide for the patient's  basic needs You should help the patient with basic needs in the home and provide support for getting groceries, prescriptions, and other personal needs.  Monitor the patient's symptoms If they are getting sicker, call his or her medical provider and tell them that the patient has, or is being evaluated for, COVID-19 infection. This will help the healthcare provider's office take steps to keep other people from getting infected. Ask the healthcare provider to call the local or state health department.  Limit the number of people who have contact with the patient  If possible, have only one caregiver for the patient.  Other household members should stay in another home or place of residence. If this is not possible, they should stay  in another room, or be separated from the patient as much as possible. Use a separate bathroom, if available.  Restrict visitors who do not have an essential need to be in the home.  Keep older adults, very young children, and other sick people away from the patient Keep older adults, very young children, and those who have compromised immune systems or chronic health conditions away from the patient. This includes people with chronic heart, lung, or kidney conditions, diabetes, and cancer.  Ensure good ventilation Make sure that shared spaces in the home have good air flow, such as from an air conditioner or an opened window, weather permitting.  Wash your hands often  Wash your hands often and thoroughly with soap and water for at least 20 seconds. You can use an alcohol based hand sanitizer if soap and water are not available and if your hands are not visibly dirty.  Avoid touching your eyes, nose, and mouth with unwashed hands.  Use disposable paper towels to dry your hands. If not available, use dedicated cloth towels and replace them when they become wet.  Wear a facemask and gloves  Wear a disposable facemask at all times in the room and gloves  when you touch or have contact with the patient's blood, body fluids, and/or secretions or excretions, such as sweat, saliva, sputum, nasal mucus, vomit, urine, or feces.  Ensure the mask fits over your nose and mouth tightly, and do not touch it during use.  Throw out disposable facemasks and gloves after using them. Do not reuse.  Wash your hands immediately after removing your facemask and gloves.  If your personal clothing becomes contaminated, carefully remove clothing and launder. Wash your hands after handling contaminated clothing.  Place all used disposable facemasks, gloves, and other waste in a lined container before disposing them with other household waste.  Remove gloves and wash your hands immediately after handling these items.  Do not share dishes, glasses, or other household items with the patient  Avoid sharing household items. You should not share dishes, drinking glasses, cups, eating utensils, towels, bedding, or other items with a patient who is confirmed to have, or being evaluated for, COVID-19 infection.  After the person uses these items, you should wash them thoroughly with soap and water.  Wash laundry thoroughly  Immediately remove and wash clothes or bedding that have blood, body fluids, and/or secretions or excretions,  such as sweat, saliva, sputum, nasal mucus, vomit, urine, or feces, on them.  Wear gloves when handling laundry from the patient.  Read and follow directions on labels of laundry or clothing items and detergent. In general, wash and dry with the warmest temperatures recommended on the label.  Clean all areas the individual has used often  Clean all touchable surfaces, such as counters, tabletops, doorknobs, bathroom fixtures, toilets, phones, keyboards, tablets, and bedside tables, every day. Also, clean any surfaces that may have blood, body fluids, and/or secretions or excretions on them.  Wear gloves when cleaning surfaces the patient  has come in contact with.  Use a diluted bleach solution (e.g., dilute bleach with 1 part bleach and 10 parts water) or a household disinfectant with a label that says EPA-registered for coronaviruses. To make a bleach solution at home, add 1 tablespoon of bleach to 1 quart (4 cups) of water. For a larger supply, add  cup of bleach to 1 gallon (16 cups) of water.  Read labels of cleaning products and follow recommendations provided on product labels. Labels contain instructions for safe and effective use of the cleaning product including precautions you should take when applying the product, such as wearing gloves or eye protection and making sure you have good ventilation during use of the product.  Remove gloves and wash hands immediately after cleaning.  Monitor yourself for signs and symptoms of illness Caregivers and household members are considered close contacts, should monitor their health, and will be asked to limit movement outside of the home to the extent possible. Follow the monitoring steps for close contacts listed on the symptom monitoring form.   ? If you have additional questions, contact your local health department or call the epidemiologist on call at [phone] (available 24/7). ? This guidance is subject to change. For the most up-to-date guidance from [facility], please refer to their website: [email or web address]

## 2020-04-11 ENCOUNTER — Ambulatory Visit (INDEPENDENT_AMBULATORY_CARE_PROVIDER_SITE_OTHER): Payer: Medicare Other | Admitting: Family Medicine

## 2020-04-11 ENCOUNTER — Telehealth: Payer: Self-pay

## 2020-04-11 ENCOUNTER — Encounter: Payer: Self-pay | Admitting: Family Medicine

## 2020-04-11 ENCOUNTER — Other Ambulatory Visit: Payer: Self-pay

## 2020-04-11 VITALS — BP 135/84 | HR 76 | Temp 98.1°F | Wt 188.5 lb

## 2020-04-11 DIAGNOSIS — E1169 Type 2 diabetes mellitus with other specified complication: Secondary | ICD-10-CM | POA: Diagnosis not present

## 2020-04-11 DIAGNOSIS — E785 Hyperlipidemia, unspecified: Secondary | ICD-10-CM

## 2020-04-11 DIAGNOSIS — I420 Dilated cardiomyopathy: Secondary | ICD-10-CM

## 2020-04-11 DIAGNOSIS — B070 Plantar wart: Secondary | ICD-10-CM

## 2020-04-11 DIAGNOSIS — I10 Essential (primary) hypertension: Secondary | ICD-10-CM | POA: Diagnosis not present

## 2020-04-11 DIAGNOSIS — E1159 Type 2 diabetes mellitus with other circulatory complications: Secondary | ICD-10-CM

## 2020-04-11 DIAGNOSIS — I209 Angina pectoris, unspecified: Secondary | ICD-10-CM | POA: Diagnosis not present

## 2020-04-11 DIAGNOSIS — I2510 Atherosclerotic heart disease of native coronary artery without angina pectoris: Secondary | ICD-10-CM

## 2020-04-11 LAB — BAYER DCA HB A1C WAIVED: HB A1C (BAYER DCA - WAIVED): 6 % (ref <7.0)

## 2020-04-11 MED ORDER — ROSUVASTATIN CALCIUM 20 MG PO TABS: 20.0000 mg | ORAL_TABLET | Freq: Every day | ORAL | 1 refills | Status: DC

## 2020-04-11 MED ORDER — METOPROLOL SUCCINATE ER 100 MG PO TB24: ORAL_TABLET | ORAL | 1 refills | Status: DC

## 2020-04-11 MED ORDER — SPIRONOLACTONE 25 MG PO TABS: 25.0000 mg | ORAL_TABLET | Freq: Every day | ORAL | 1 refills | Status: DC

## 2020-04-11 MED ORDER — EZETIMIBE 10 MG PO TABS: 10.0000 mg | ORAL_TABLET | Freq: Every day | ORAL | 1 refills | Status: DC

## 2020-04-11 MED ORDER — STEGLATRO 5 MG PO TABS: 5.0000 mg | ORAL_TABLET | Freq: Every day | ORAL | 1 refills | Status: DC

## 2020-04-11 NOTE — Assessment & Plan Note (Signed)
Under good control on current regimen. Continue current regimen. Continue to monitor. Call with any concerns. Refills given. Labs drawn today.   

## 2020-04-11 NOTE — Assessment & Plan Note (Signed)
Follows with cardiology. Call with any concerns. Continue to monitor.

## 2020-04-11 NOTE — Assessment & Plan Note (Signed)
Follows with cardiology. Call with any concerns. Continue to monitor. 

## 2020-04-11 NOTE — Progress Notes (Signed)
BP 135/84    Pulse 76    Temp 98.1 F (36.7 C)    Wt 188 lb 8 oz (85.5 kg)    SpO2 96%    BMI 28.66 kg/m    Subjective:    Patient ID: Samuel Morris, male    DOB: [birth year], 75 y.o.   MRN: [id number]  HPI: Samuel Morris is a 75 y.o. male  Chief Complaint  Patient presents with   Diabetes   Hyperlipidemia   Hypertension   DIABETES Hypoglycemic episodes:no Polydipsia/polyuria: no Visual disturbance: no Chest pain: no Paresthesias: no Glucose Monitoring: no  Accucheck frequency: Not Checking Taking Insulin?: no Blood Pressure Monitoring: not checking Retinal Examination: Up to Date Foot Exam: Up to Date Diabetic Education: Completed Pneumovax: Up to Date Influenza: Up to Date Aspirin: yes  HYPERTENSION / HYPERLIPIDEMIA Satisfied with current treatment? yes Duration of hypertension: chronic BP monitoring frequency: rarely BP medication side effects: no Past BP meds: spironalactone, entresto Duration of hyperlipidemia: chronic Cholesterol medication side effects: no Cholesterol supplements: none Past cholesterol medications: crestor, zetia Medication compliance: excellent compliance Aspirin: yes Recent stressors: no Recurrent headaches: no Visual changes: no Palpitations: no Dyspnea: no Chest pain: no Lower extremity edema: no Dizzy/lightheaded: no   Relevant past medical, surgical, family and social history reviewed and updated as indicated. Interim medical history since our last visit reviewed. Allergies and medications reviewed and updated.  Review of Systems  Constitutional: Negative.   Respiratory: Negative.   Cardiovascular: Negative.   Gastrointestinal: Negative.   Musculoskeletal: Negative.   Neurological: Negative.   Psychiatric/Behavioral: Negative.     Per HPI unless specifically indicated above     Objective:    BP 135/84    Pulse 76    Temp 98.1 F (36.7 C)    Wt 188 lb 8 oz (85.5 kg)    SpO2 96%    BMI 28.66 kg/m   Wt Readings  from Last 3 Encounters:  04/11/20 188 lb 8 oz (85.5 kg)  04/01/20 189 lb (85.7 kg)  09/10/19 190 lb 6.4 oz (86.4 kg)    Physical Exam Vitals and nursing note reviewed.  Constitutional:      General: He is not in acute distress.    Appearance: Normal appearance. He is not ill-appearing, toxic-appearing or diaphoretic.  HENT:     Head: Normocephalic and atraumatic.     Right Ear: External ear normal.     Left Ear: External ear normal.     Nose: Nose normal.     Mouth/Throat:     Mouth: Mucous membranes are moist.     Pharynx: Oropharynx is clear.  Eyes:     General: No scleral icterus.       Right eye: No discharge.        Left eye: No discharge.     Extraocular Movements: Extraocular movements intact.     Conjunctiva/sclera: Conjunctivae normal.     Pupils: Pupils are equal, round, and reactive to light.  Cardiovascular:     Rate and Rhythm: Normal rate and regular rhythm.     Pulses: Normal pulses.     Heart sounds: Normal heart sounds. No murmur heard. No friction rub. No gallop.   Pulmonary:     Effort: Pulmonary effort is normal. No respiratory distress.     Breath sounds: Normal breath sounds. No stridor. No wheezing, rhonchi or rales.  Chest:     Chest wall: No tenderness.  Musculoskeletal:        General: Normal  range of motion.     Cervical back: Normal range of motion and neck supple.  Skin:    General: Skin is warm and dry.     Capillary Refill: Capillary refill takes less than 2 seconds.     Coloration: Skin is not jaundiced or pale.     Findings: No bruising, erythema, lesion or rash.  Neurological:     General: No focal deficit present.     Mental Status: He is alert and oriented to person, place, and time. Mental status is at baseline.  Psychiatric:        Mood and Affect: Mood normal.        Behavior: Behavior normal.        Thought Content: Thought content normal.        Judgment: Judgment normal.     Results for orders placed or performed in visit  on 10/29/19  HM DIABETES EYE EXAM  Result Value Ref Range   HM Diabetic Eye Exam No Retinopathy No Retinopathy      Assessment & Plan:   Problem List Items Addressed This Visit      Cardiovascular and Mediastinum   CAD (coronary artery disease)    Follows with cardiology. Call with any concerns. Continue to monitor.      Relevant Medications   spironolactone (ALDACTONE) 25 MG tablet   metoprolol succinate (TOPROL-XL) 100 MG 24 hr tablet   ezetimibe (ZETIA) 10 MG tablet   rosuvastatin (CRESTOR) 20 MG tablet   Type 2 diabetes mellitus with cardiac complication (HCC)    Doing well with A1c of 6.0. Continue steglatro. Continue to monitor. Call with any concerns.       Relevant Medications   spironolactone (ALDACTONE) 25 MG tablet   metoprolol succinate (TOPROL-XL) 100 MG 24 hr tablet   ezetimibe (ZETIA) 10 MG tablet   ertugliflozin L-PyroglutamicAc (STEGLATRO) 5 MG TABS tablet   rosuvastatin (CRESTOR) 20 MG tablet   Other Relevant Orders   Bayer DCA Hb A1c Waived   CBC with Differential/Platelet   Comprehensive metabolic panel   HTN (hypertension) - Primary    Under good control on current regimen. Continue current regimen. Continue to monitor. Call with any concerns. Refills given. Labs drawn today.       Relevant Medications   spironolactone (ALDACTONE) 25 MG tablet   metoprolol succinate (TOPROL-XL) 100 MG 24 hr tablet   ezetimibe (ZETIA) 10 MG tablet   rosuvastatin (CRESTOR) 20 MG tablet   Other Relevant Orders   CBC with Differential/Platelet   Comprehensive metabolic panel   Angina pectoris ([place])    Follows with cardiology. Call with any concerns. Continue to monitor.      Relevant Medications   spironolactone (ALDACTONE) 25 MG tablet   metoprolol succinate (TOPROL-XL) 100 MG 24 hr tablet   ezetimibe (ZETIA) 10 MG tablet   rosuvastatin (CRESTOR) 20 MG tablet   Dilated cardiomyopathy ([place])    Follows with cardiology. Call with any concerns. Continue to  monitor.      Relevant Medications   spironolactone (ALDACTONE) 25 MG tablet   metoprolol succinate (TOPROL-XL) 100 MG 24 hr tablet   ezetimibe (ZETIA) 10 MG tablet   rosuvastatin (CRESTOR) 20 MG tablet     Endocrine   Hyperlipidemia associated with type 2 diabetes mellitus ([place])    Under good control on current regimen. Continue current regimen. Continue to monitor. Call with any concerns. Refills given. Labs drawn today.      Relevant Medications   ezetimibe (  ZETIA) 10 MG tablet   ertugliflozin L-PyroglutamicAc (STEGLATRO) 5 MG TABS tablet   rosuvastatin (CRESTOR) 20 MG tablet   Other Relevant Orders   CBC with Differential/Platelet   Comprehensive metabolic panel   Lipid Panel w/o Chol/HDL Ratio    Other Visit Diagnoses    Plantar wart       Would like to see podiatry to get rid of it. Referral generated today.   Relevant Orders   Ambulatory referral to Podiatry       Follow up plan: Return in about 6 months (around 10/09/2020).

## 2020-04-11 NOTE — Assessment & Plan Note (Signed)
Doing well with A1c of 6.0. Continue steglatro. Continue to monitor. Call with any concerns.

## 2020-04-11 NOTE — Telephone Encounter (Signed)
PA for [facility] initiated and submitted via Cover My Meds. Key: [id number]

## 2020-04-12 LAB — CBC WITH DIFFERENTIAL/PLATELET
Basophils Absolute: 0 10*3/uL (ref 0.0–0.2)
Basos: 0 %
EOS (ABSOLUTE): 0.3 10*3/uL (ref 0.0–0.4)
Eos: 4 %
Hematocrit: 50.2 % (ref 37.5–51.0)
Hemoglobin: 16.3 g/dL (ref 13.0–17.7)
Immature Grans (Abs): 0 10*3/uL (ref 0.0–0.1)
Immature Granulocytes: 0 %
Lymphocytes Absolute: 1.5 10*3/uL (ref 0.7–3.1)
Lymphs: 16 %
MCH: 28 pg (ref 26.6–33.0)
MCHC: 32.5 g/dL (ref 31.5–35.7)
MCV: 86 fL (ref 79–97)
Monocytes Absolute: 0.9 10*3/uL (ref 0.1–0.9)
Monocytes: 10 %
Neutrophils Absolute: 6.4 10*3/uL (ref 1.4–7.0)
Neutrophils: 70 %
Platelets: 260 10*3/uL (ref 150–450)
RBC: 5.82 x10E6/uL — ABNORMAL HIGH (ref 4.14–5.80)
RDW: 12.9 % (ref 11.6–15.4)
WBC: 9.2 10*3/uL (ref 3.4–10.8)

## 2020-04-12 LAB — COMPREHENSIVE METABOLIC PANEL
ALT: 39 IU/L (ref 0–44)
AST: 27 IU/L (ref 0–40)
Albumin/Globulin Ratio: 1.8 (ref 1.2–2.2)
Albumin: 4.7 g/dL (ref 3.7–4.7)
Alkaline Phosphatase: 112 IU/L (ref 44–121)
BUN/Creatinine Ratio: 14 (ref 10–24)
BUN: 13 mg/dL (ref 8–27)
Bilirubin Total: 0.7 mg/dL (ref 0.0–1.2)
CO2: 18 mmol/L — ABNORMAL LOW (ref 20–29)
Calcium: 9.9 mg/dL (ref 8.6–10.2)
Chloride: 103 mmol/L (ref 96–106)
Creatinine, Ser: 0.9 mg/dL (ref 0.76–1.27)
GFR calc Af Amer: 96 mL/min/{1.73_m2} (ref >59)
GFR calc non Af Amer: 83 mL/min/{1.73_m2} (ref >59)
Globulin, Total: 2.6 g/dL (ref 1.5–4.5)
Glucose: 124 mg/dL — ABNORMAL HIGH (ref 65–99)
Potassium: 5 mmol/L (ref 3.5–5.2)
Sodium: 140 mmol/L (ref 134–144)
Total Protein: 7.3 g/dL (ref 6.0–8.5)

## 2020-04-12 LAB — LIPID PANEL W/O CHOL/HDL RATIO
Cholesterol, Total: 131 mg/dL (ref 100–199)
HDL: 37 mg/dL — ABNORMAL LOW (ref >39)
LDL Chol Calc (NIH): 76 mg/dL (ref 0–99)
Triglycerides: 92 mg/dL (ref 0–149)
VLDL Cholesterol Cal: 18 mg/dL (ref 5–40)

## 2020-04-18 NOTE — Telephone Encounter (Signed)
PA denied. Denial letter states that the patient must try and fail Jardiance first.

## 2020-04-24 MED ORDER — EMPAGLIFLOZIN 25 MG PO TABS: 25.0000 mg | ORAL_TABLET | Freq: Every day | ORAL | 1 refills | Status: DC

## 2020-04-24 NOTE — Addendum Note (Signed)
Addended by: Valerie Roys on: 04/24/2020 02:58 PM   Modules accepted: Orders

## 2020-04-29 ENCOUNTER — Telehealth: Payer: Self-pay

## 2020-04-29 NOTE — Telephone Encounter (Signed)
Called teressa no answer left vm we have received records request will be handled by [facility]   Copied from [place] [id number]. Topic: General - Other >> Apr 29, 2020 12:28 PM Antonieta Iba C wrote: Reason for CRM: Rosanne Ashing with [employer] is calling in to confirm receipt of request for medical records   [phone]- she will call back on Monday to follow up / confirm

## 2020-05-19 LAB — HM DIABETES EYE EXAM

## 2020-06-06 NOTE — Telephone Encounter (Signed)
Spoke with Samuel Morris and let her know we have received fax and that it goes to [employer]

## 2020-06-06 NOTE — Telephone Encounter (Signed)
Samuel Morris with [employer] is calling to see if fax was received for medical records. Please advise Cb- [phone]

## 2020-06-07 ENCOUNTER — Other Ambulatory Visit: Payer: Self-pay

## 2020-06-07 ENCOUNTER — Ambulatory Visit (INDEPENDENT_AMBULATORY_CARE_PROVIDER_SITE_OTHER): Payer: Medicare Other | Admitting: Podiatry

## 2020-06-07 ENCOUNTER — Encounter: Payer: Self-pay | Admitting: Podiatry

## 2020-06-07 DIAGNOSIS — E1159 Type 2 diabetes mellitus with other circulatory complications: Secondary | ICD-10-CM

## 2020-06-07 DIAGNOSIS — Q828 Other specified congenital malformations of skin: Secondary | ICD-10-CM

## 2020-06-07 NOTE — Progress Notes (Signed)
Subjective:  Patient ID: Samuel Morris, male    DOB: [birth year],  MRN: [id number]  Chief Complaint  Patient presents with  . Callouses    Patient presents for painful plantar wart bottom of left forefoot x years    75 y.o. male presents with the above complaint.  Patient presents with complaint of left submetatarsal 2 hyperkeratotic lesion/porokeratosis.  It is painful to touch.  Patient states that it hurts with ambulation.  He has not tried anything for it.  He is a diabetic.  He would like to discuss treatment options.  He denies any other acute complaints.   Review of Systems: Negative except as noted in the HPI. Denies N/V/F/Ch.  Past Medical History:  Diagnosis Date  . Cancer (HCC)    Scalp  . Cataract   . Diabetes mellitus without complication ([place])   . Heart attack ([place])   . Heart disease   . Hyperlipidemia   . Hypertension   . Sleep apnea   . Stenosis of aorta     Current Outpatient Medications:  .  albuterol (VENTOLIN HFA) 108 (90 Base) MCG/ACT inhaler, Inhale 1 puff into the lungs every 4 (four) hours as needed for wheezing or shortness of breath., Disp: 8 g, Rfl: 0 .  Ascorbic Acid (VITAMIN C PO), Take 100 mg by mouth daily., Disp: , Rfl:  .  aspirin EC 81 MG tablet, Take by mouth., Disp: , Rfl:  .  Calcium-Magnesium 100-50 MG TABS, Take by mouth., Disp: , Rfl:  .  Cholecalciferol (VITAMIN D-1000 MAX ST) 25 MCG (1000 UT) tablet, Take by mouth., Disp: , Rfl:  .  empagliflozin (JARDIANCE) 25 MG TABS tablet, Take 1 tablet (25 mg total) by mouth daily before breakfast., Disp: 90 tablet, Rfl: 1 .  ENTRESTO 49-51 MG, TK 1 T PO BID, Disp: , Rfl:  .  ezetimibe (ZETIA) 10 MG tablet, Take 1 tablet (10 mg total) by mouth daily., Disp: 90 tablet, Rfl: 1 .  GLUCOSAMINE-CHONDROITIN PO, Take 1 tablet by mouth daily., Disp: , Rfl:  .  meloxicam (MOBIC) 15 MG tablet, Take 15 mg by mouth daily., Disp: , Rfl:  .  metoprolol succinate (TOPROL-XL) 100 MG 24 hr tablet, Take 1.5 tabs  daily. Take with or immediately following a meal., Disp: 135 tablet, Rfl: 1 .  nitroGLYCERIN (NITROSTAT) 0.4 MG SL tablet, Place 1 tablet (0.4 mg total) under the tongue every 5 (five) minutes as needed for chest pain., Disp: 30 tablet, Rfl: 12 .  rosuvastatin (CRESTOR) 20 MG tablet, Take 1 tablet (20 mg total) by mouth at bedtime., Disp: 90 tablet, Rfl: 1 .  spironolactone (ALDACTONE) 25 MG tablet, Take 1 tablet (25 mg total) by mouth daily., Disp: 90 tablet, Rfl: 1 .  vitamin B-12 (CYANOCOBALAMIN) 1000 MCG tablet, Take by mouth., Disp: , Rfl:   Social History   Tobacco Use  Smoking Status Never Smoker  Smokeless Tobacco Never Used    No Known Allergies Objective:  There were no vitals filed for this visit. There is no height or weight on file to calculate BMI. Constitutional Well developed. Well nourished.  Vascular Dorsalis pedis pulses palpable bilaterally. Posterior tibial pulses palpable bilaterally. Capillary refill normal to all digits.  No cyanosis or clubbing noted. Pedal hair growth normal.  Neurologic Normal speech. Oriented to person, place, and time. Epicritic sensation to light touch grossly present bilaterally.  Dermatologic  thickened elongated dystrophic toenails x10.  Left submetatarsal 2 hyperkeratotic lesion with central nucleated core pain on palpation  lesion in the nails.  Orthopedic: Normal joint ROM without pain or crepitus bilaterally. No visible deformities. No bony tenderness.   Radiographs: None Assessment:   1. Type 2 diabetes mellitus with cardiac complication (HCC)   2. Porokeratosis    Plan:  Patient was evaluated and treated and all questions answered.  Left submetatarsal 2 porokeratosis -I explained to the patient the etiology of porokeratosis and various treatment options were extensively discussed.  Given the amount of pain he is having I believe patient will benefit from aggressive debridement of the lesion.  Patient agrees with the plan  would like to proceed with the debridement -Using chisel blade to handle the lesion were debrided down to healthy striated tissue.  No pinpoint bleeding noted no complication noted.  No follow-ups on file.

## 2020-06-10 ENCOUNTER — Telehealth: Payer: Self-pay | Admitting: Podiatry

## 2020-06-10 NOTE — Telephone Encounter (Signed)
Patient has requested order to be submitted to insurance for approval, to be seen more frequently for Diabetic RFC, Please Advise

## 2020-06-13 NOTE — Telephone Encounter (Signed)
Insurance will only allow every 62 days. We can have him follow up with Dr. Prudence Davidson or Dr. Adah Perl for [facility]

## 2020-06-14 NOTE — Telephone Encounter (Signed)
Ok thanks, patients wife is nurse stated it was possible although I did inform her of the 13 days for insurance to cover. I will let them know

## 2020-07-07 ENCOUNTER — Telehealth: Payer: Self-pay

## 2020-07-07 NOTE — Telephone Encounter (Signed)
Copied from [place] [id number]. Topic: General - Inquiry >> Jul 07, 2020  3:32 PM Greggory Keen D wrote: Reason for CRM: Pt states he was treated for bronchitis a couple weeks ago.  It has gotten better but he still has a persistent cough.   He wants to know if she can send something to [facility] in [place].  CB#  [phone]   Would patient need in the office or virtual apt as states cough is bronchitis and no other SX  Has been seeing pulmonologist for this as well.

## 2020-07-08 ENCOUNTER — Encounter: Payer: Self-pay | Admitting: Nurse Practitioner

## 2020-07-08 ENCOUNTER — Ambulatory Visit (INDEPENDENT_AMBULATORY_CARE_PROVIDER_SITE_OTHER): Payer: Medicare Other | Admitting: Nurse Practitioner

## 2020-07-08 ENCOUNTER — Other Ambulatory Visit: Payer: Self-pay

## 2020-07-08 ENCOUNTER — Other Ambulatory Visit: Payer: Self-pay | Admitting: Nurse Practitioner

## 2020-07-08 VITALS — BP 138/69 | HR 80 | Temp 98.5°F | Wt 194.6 lb

## 2020-07-08 DIAGNOSIS — M1990 Unspecified osteoarthritis, unspecified site: Secondary | ICD-10-CM | POA: Diagnosis not present

## 2020-07-08 DIAGNOSIS — J849 Interstitial pulmonary disease, unspecified: Secondary | ICD-10-CM | POA: Diagnosis not present

## 2020-07-08 DIAGNOSIS — I209 Angina pectoris, unspecified: Secondary | ICD-10-CM

## 2020-07-08 MED ORDER — BENZONATATE 100 MG PO CAPS: 100.0000 mg | ORAL_CAPSULE | Freq: Two times a day (BID) | ORAL | 0 refills | Status: DC | PRN

## 2020-07-08 MED ORDER — MONTELUKAST SODIUM 10 MG PO TABS: 10.0000 mg | ORAL_TABLET | Freq: Every day | ORAL | 1 refills | Status: DC

## 2020-07-08 NOTE — Telephone Encounter (Signed)
Requested Prescriptions  Pending Prescriptions Disp Refills  . montelukast (SINGULAIR) 10 MG tablet [Pharmacy Med Name: MONTELUKAST 10MG  TABLETS] 90 tablet 0    Sig: TAKE 1 TABLET(10 MG) BY MOUTH AT BEDTIME     Pulmonology:  Leukotriene Inhibitors Passed - 07/08/2020  1:57 PM      Passed - Valid encounter within last 12 months    Recent Outpatient Visits          Today    [facility], Scheryl Darter, NP   2 months ago Primary hypertension   [place], Dutton, DO   3 months ago Viral URI   [place] Rory Percy M, DO   10 months ago Diabetes mellitus without complication [facility])   [place], Shabbona P, DO   1 year ago Hyperlipidemia associated with type 2 diabetes mellitus ([place])   [place], Barb Merino, DO      Future Appointments            In 3 months Johnson, Barb Merino, DO [place], [place]   In 8 months  [employer], [facility]

## 2020-07-08 NOTE — Progress Notes (Signed)
Acute Office Visit  Subjective:    Patient ID: Samuel Morris, male    DOB: 04/14/1945, 75 y.o.   MRN: [id number]  Chief Complaint  Patient presents with  . Cough    Pt states he was treated for bronchitis a few months ago, states he is still having a cough from it  . Arthritis    Pt states he has been having trouble with his arthritis, especially in his hands and wrists recently    HPI Patient is in today for cough and joint pain  COUGH  Duration: months Circumstances of initial development of cough: URI, had bronchitis a few months ago Cough severity: moderate Cough description: productive Aggravating factors:  smoke, espcially if frying foods in the kitchen, dust Alleviating factors: cough syrup Status:  stable Treatments attempted: cough syrup Wheezing: no Shortness of breath: with movement Chest pain: no Chest tightness:no Nasal congestion: no Runny nose: no Postnasal drip: no Frequent throat clearing or swallowing: no Hemoptysis: no Fevers: no Night sweats: no Weight loss: no Heartburn: no Recent foreign travel: no Tuberculosis contacts: no  ARTHRALGIAS / JOINT ACHES  Duration: months Pain: yes Symmetric: yes  moderate Quality: aching Frequency: intermittent Context:  worse Decreased function/range of motion: no Erythema: yes Swelling: yes Heat or warmth: no Morning stiffness: yes Aggravating factors: working in the garden Alleviating factors: mobic  Relief with NSAIDs?: mild Treatments attempted:  mobic  Involved Joints:     Hands: yes bilateral    Wrists: yes bilateral     Elbows: no     Shoulders: no    Back: no     Hips: no     Knees: yes bilateral    Ankles: no     Feet: no   Past Medical History:  Diagnosis Date  . Cancer (HCC)    Scalp  . Cataract   . Diabetes mellitus without complication ([place])   . Heart attack ([place])   . Heart disease   . Hyperlipidemia   . Hypertension   . Sleep apnea   . Stenosis of aorta     Past  Surgical History:  Procedure Laterality Date  . CARDIAC DEFIBRILLATOR PLACEMENT    . CARDIAC SURGERY      Family History  Problem Relation Age of Onset  . Heart disease Mother   . Heart attack Father   . Arthritis Sister   . Hypertension Sister   . Hypertension Brother   . Heart disease Paternal Grandmother   . Heart murmur Sister     Social History   Socioeconomic History  . Marital status: Married    Spouse name: Not on file  . Number of children: Not on file  . Years of education: Not on file  . Highest education level: Not on file  Occupational History  . Occupation: retired  Tobacco Use  . Smoking status: Never Smoker  . Smokeless tobacco: Never Used  Vaping Use  . Vaping Use: Never used  Substance and Sexual Activity  . Alcohol use: Yes    Comment: On occasion  . Drug use: Never  . Sexual activity: Yes    Birth control/protection: None  Other Topics Concern  . Not on file  Social History Narrative  . Not on file   Social Determinants of Health   Financial Resource Strain: Low Risk   . Difficulty of Paying Living Expenses: Not hard at all  Food Insecurity: No Food Insecurity  . Worried About Charity fundraiser in the  Last Year: Never true  . Ran Out of Food in the Last Year: Never true  Transportation Needs: No Transportation Needs  . Lack of Transportation (Medical): No  . Lack of Transportation (Non-Medical): No  Physical Activity: Inactive  . Days of Exercise per Week: 0 days  . Minutes of Exercise per Session: 0 min  Stress: No Stress Concern Present  . Feeling of Stress : Not at all  Social Connections: Not on file  Intimate Partner Violence: Not on file    Outpatient Medications Prior to Visit  Medication Sig Dispense Refill  . albuterol (VENTOLIN HFA) 108 (90 Base) MCG/ACT inhaler Inhale 1 puff into the lungs every 4 (four) hours as needed for wheezing or shortness of breath. 8 g 0  . Ascorbic Acid (VITAMIN C PO) Take 100 mg by mouth  daily.    Marland Kitchen aspirin EC 81 MG tablet Take by mouth.    . Calcium-Magnesium 100-50 MG TABS Take by mouth.    . Cholecalciferol (VITAMIN D-1000 MAX ST) 25 MCG (1000 UT) tablet Take by mouth.    . ENTRESTO 49-51 MG TK 1 T PO BID    . ezetimibe (ZETIA) 10 MG tablet Take 1 tablet (10 mg total) by mouth daily. 90 tablet 1  . GLUCOSAMINE-CHONDROITIN PO Take 1 tablet by mouth daily.    . meloxicam (MOBIC) 15 MG tablet Take 15 mg by mouth daily.    . metoprolol succinate (TOPROL-XL) 100 MG 24 hr tablet Take 1.5 tabs daily. Take with or immediately following a meal. 135 tablet 1  . nitroGLYCERIN (NITROSTAT) 0.4 MG SL tablet Place 1 tablet (0.4 mg total) under the tongue every 5 (five) minutes as needed for chest pain. 30 tablet 12  . rosuvastatin (CRESTOR) 20 MG tablet Take 1 tablet (20 mg total) by mouth at bedtime. 90 tablet 1  . spironolactone (ALDACTONE) 25 MG tablet Take 1 tablet (25 mg total) by mouth daily. 90 tablet 1  . vitamin B-12 (CYANOCOBALAMIN) 1000 MCG tablet Take by mouth.    . empagliflozin (JARDIANCE) 25 MG TABS tablet Take 1 tablet (25 mg total) by mouth daily before breakfast. 90 tablet 1   No facility-administered medications prior to visit.    No Known Allergies  Review of Systems  Constitutional: Positive for fatigue. Negative for chills, diaphoresis and fever.  HENT: Negative.   Eyes: Negative.   Respiratory: Positive for cough and shortness of breath (with exertion).   Cardiovascular: Negative.   Gastrointestinal: Negative.   Genitourinary: Negative.   Musculoskeletal: Positive for arthralgias (wrists, fingers, and knees).  Skin: Negative.   Neurological: Negative.   Psychiatric/Behavioral: Negative.        Objective:    Physical Exam Vitals and nursing note reviewed.  Constitutional:      Appearance: Normal appearance.  HENT:     Head: Normocephalic.  Eyes:     Conjunctiva/sclera: Conjunctivae normal.  Cardiovascular:     Rate and Rhythm: Normal rate and  regular rhythm.     Pulses: Normal pulses.     Heart sounds: Normal heart sounds.  Pulmonary:     Effort: Pulmonary effort is normal.     Breath sounds: Examination of the right-lower field reveals rhonchi. Examination of the left-lower field reveals rhonchi. Rhonchi present.  Musculoskeletal:        General: Swelling present.     Cervical back: Normal range of motion.     Comments: Swelling and redness noted to metacarpophalangeal joints  Skin:  General: Skin is warm.  Neurological:     General: No focal deficit present.     Mental Status: He is alert and oriented to person, place, and time.  Psychiatric:        Mood and Affect: Mood normal.        Behavior: Behavior normal.        Thought Content: Thought content normal.        Judgment: Judgment normal.     BP 138/69   Pulse 80   Temp 98.5 F (36.9 C) (Oral)   Wt 194 lb 9.6 oz (88.3 kg)   SpO2 92%   BMI 29.59 kg/m  Wt Readings from Last 3 Encounters:  07/08/20 194 lb 9.6 oz (88.3 kg)  04/11/20 188 lb 8 oz (85.5 kg)  04/01/20 189 lb (85.7 kg)    Health Maintenance Due  Topic Date Due  . COLONOSCOPY (Pts 45-81yrs Insurance coverage will need to be confirmed)  Never done  . COVID-19 Vaccine (3 - Booster for Pfizer series) 11/29/2019    There are no preventive care reminders to display for this patient.   Lab Results  Component Value Date   TSH 3.240 11/06/2018   Lab Results  Component Value Date   WBC 9.2 04/11/2020   HGB 16.3 04/11/2020   HCT 50.2 04/11/2020   MCV 86 04/11/2020   PLT 260 04/11/2020   Lab Results  Component Value Date   NA 140 04/11/2020   K 5.0 04/11/2020   CO2 18 (L) 04/11/2020   GLUCOSE 124 (H) 04/11/2020   BUN 13 04/11/2020   CREATININE 0.90 04/11/2020   BILITOT 0.7 04/11/2020   ALKPHOS 112 04/11/2020   AST 27 04/11/2020   ALT 39 04/11/2020   PROT 7.3 04/11/2020   ALBUMIN 4.7 04/11/2020   CALCIUM 9.9 04/11/2020   Lab Results  Component Value Date   CHOL 131 04/11/2020    Lab Results  Component Value Date   HDL 37 (L) 04/11/2020   Lab Results  Component Value Date   LDLCALC 76 04/11/2020   Lab Results  Component Value Date   TRIG 92 04/11/2020   No results found for: CHOLHDL Lab Results  Component Value Date   HGBA1C 6.0 04/11/2020       Assessment & Plan:   Problem List Items Addressed This Visit      Respiratory   Interstitial lung disease ([place])    Recently diagnosed by pulmonology. Is receiving extensive work-up with them. Candon states he feels like they are just doing tests and not treating his cough. Since noted worsening with triggers such as dust, pollen, and smoke, will start singulair and tessalon prn. Encouraged him to use his albuterol inhaler and to follow up with pulmonology. Explained that his cough is mostly related to his interstitial lung disease and the tests being run are to find out how to best treat his symptoms. He has an appointment with pulmonology in June. Keep appointment with Dr. Wynetta Emery in August, or sooner if symptoms worsen.         Musculoskeletal and Integument   Arthritis - Primary    Had recent positive rheumatoid factor test. He already has a referral to rheumatology. He can continue the mobic for the pain. He can also try voltaren gel and ice/heat. Will await for rheumatology recommendations.           Meds ordered this encounter  Medications  . DISCONTD: montelukast (SINGULAIR) 10 MG tablet    Sig:  Take 1 tablet (10 mg total) by mouth at bedtime.    Dispense:  30 tablet    Refill:  1  . benzonatate (TESSALON) 100 MG capsule    Sig: Take 1 capsule (100 mg total) by mouth 2 (two) times daily as needed for cough.    Dispense:  30 capsule    Refill:  0     Charyl Dancer, NP

## 2020-07-08 NOTE — Patient Instructions (Addendum)
It was great to see you!  You can use voltaren gel on your hands and wrists 3-4 times a day to help with pain and inflammation. You can also take tylenol as needed. You can try heat and/or ice if that helps with comfort.   Keep your appointment in August with Dr. Wynetta Emery, sooner if you have concerns.  If a referral was placed today, you will be contacted for an appointment. Please note that routine referrals can sometimes take up to 3-4 weeks to process. Please call our office if you haven't heard anything after this time frame.  Take care,  Vance Peper, NP

## 2020-07-09 DIAGNOSIS — M1990 Unspecified osteoarthritis, unspecified site: Secondary | ICD-10-CM | POA: Insufficient documentation

## 2020-07-09 DIAGNOSIS — J849 Interstitial pulmonary disease, unspecified: Secondary | ICD-10-CM | POA: Insufficient documentation

## 2020-07-09 DIAGNOSIS — M069 Rheumatoid arthritis, unspecified: Secondary | ICD-10-CM | POA: Insufficient documentation

## 2020-07-09 NOTE — Assessment & Plan Note (Signed)
Recently diagnosed by pulmonology. Is receiving extensive work-up with them. Samuel Morris states he feels like they are just doing tests and not treating his cough. Since noted worsening with triggers such as dust, pollen, and smoke, will start singulair and tessalon prn. Encouraged him to use his albuterol inhaler and to follow up with pulmonology. Explained that his cough is mostly related to his interstitial lung disease and the tests being run are to find out how to best treat his symptoms. He has an appointment with pulmonology in June. Keep appointment with Dr. Wynetta Emery in August, or sooner if symptoms worsen.

## 2020-07-09 NOTE — Assessment & Plan Note (Signed)
Had recent positive rheumatoid factor test. He already has a referral to rheumatology. He can continue the mobic for the pain. He can also try voltaren gel and ice/heat. Will await for rheumatology recommendations.

## 2020-08-01 ENCOUNTER — Telehealth: Payer: Self-pay | Admitting: Family Medicine

## 2020-08-01 NOTE — Telephone Encounter (Signed)
Form in providers folder for signature  

## 2020-08-01 NOTE — Telephone Encounter (Signed)
Called to check the status of a letter of competency that was faxed to office.  Stated they are re-faxing the letter that needs to be signed by the doctor.  CB# [phone]

## 2020-08-02 NOTE — Telephone Encounter (Signed)
Calling again to check the status of a letter of competency that was faxed to the office on 5/19 and 5/23.  Stated that the doctor only has to sign and send back.  Please call to confirm receipt at [phone]

## 2020-08-03 NOTE — Telephone Encounter (Signed)
Form is still in providers folder for signature.

## 2020-08-05 NOTE — Telephone Encounter (Signed)
Paperwork signed and faxed back. Called and let patient know that this was done for him.

## 2020-08-10 ENCOUNTER — Telehealth: Payer: Self-pay

## 2020-08-10 DIAGNOSIS — J849 Interstitial pulmonary disease, unspecified: Secondary | ICD-10-CM

## 2020-08-10 NOTE — Telephone Encounter (Signed)
Can a new referral be placed? °

## 2020-08-10 NOTE — Telephone Encounter (Signed)
Copied from [place] [id number]. Topic: Referral - Request for Referral >> Aug 10, 2020  4:20 PM Yvette Rack wrote: Has patient seen PCP for this complaint? yes  *If NO, is insurance requiring patient see PCP for this issue before PCP can refer them? Referral for which specialty: Pulmonary Preferred provider/office: no specific provider Reason for referral: Pt wife stated the Pulmonologist that the pt was seeing no longer accepts their insurance  Would pt need apt for refferal?

## 2020-08-10 NOTE — Telephone Encounter (Signed)
Pt came to leave form for ins form was left in bin to be reviwed asked if completed it could be faxed to [phone] to Bobetta Lime regional director account series his phone [phone]

## 2020-08-11 ENCOUNTER — Telehealth: Payer: Self-pay

## 2020-08-11 NOTE — Telephone Encounter (Signed)
Form in folder for signature

## 2020-08-11 NOTE — Telephone Encounter (Signed)
Copied from [place] [id number]. Topic: General - Other >> Aug 11, 2020 11:53 AM Bayard Beaver wrote: Reason for CRM: patient asking for cal back about cough he still has , medication doesn't seem to work. Please call back  Pt Has apt on 08/12/2020 to come in or would this be virtual?

## 2020-08-11 NOTE — Telephone Encounter (Signed)
He has known interstitial lung disease and has been following with pulmonology- I'm happy to see him, but there is unlikely much I can do. He should follow up with pulmonology- new referral was just placed today.

## 2020-08-12 ENCOUNTER — Encounter: Payer: Self-pay | Admitting: Family Medicine

## 2020-08-12 ENCOUNTER — Other Ambulatory Visit: Payer: Self-pay

## 2020-08-12 ENCOUNTER — Ambulatory Visit (INDEPENDENT_AMBULATORY_CARE_PROVIDER_SITE_OTHER): Payer: Medicare Other | Admitting: Family Medicine

## 2020-08-12 VITALS — BP 125/72 | HR 76 | Temp 98.6°F | Wt 190.4 lb

## 2020-08-12 DIAGNOSIS — J209 Acute bronchitis, unspecified: Secondary | ICD-10-CM | POA: Diagnosis not present

## 2020-08-12 DIAGNOSIS — J849 Interstitial pulmonary disease, unspecified: Secondary | ICD-10-CM

## 2020-08-12 DIAGNOSIS — I209 Angina pectoris, unspecified: Secondary | ICD-10-CM | POA: Diagnosis not present

## 2020-08-12 MED ORDER — PREDNISONE 10 MG PO TABS: ORAL_TABLET | ORAL | 0 refills | Status: DC

## 2020-08-12 MED ORDER — AZITHROMYCIN 250 MG PO TABS: ORAL_TABLET | ORAL | 0 refills | Status: AC

## 2020-08-12 NOTE — Telephone Encounter (Signed)
Left detailed message for patient to give our office a call back at his earliest convenience to discuss Dr.Johnson's recommendations.

## 2020-08-12 NOTE — Telephone Encounter (Signed)
Patient had an office visit today.

## 2020-08-12 NOTE — Telephone Encounter (Signed)
Form completed and faxed back. Called and LVM with patient letting him know that this was done for him. Copy placed up front in case patient would like to pick up.

## 2020-08-12 NOTE — Assessment & Plan Note (Signed)
Being worked up by pulmonology. Chronic cough likely due to this- encouraged him to follow up with his pulmonologist as able. Call with any concerns.

## 2020-08-12 NOTE — Progress Notes (Signed)
BP 125/72   Pulse 76   Temp 98.6 F (37 C) (Oral)   Wt 190 lb 6.4 oz (86.4 kg)   SpO2 96%   BMI 28.95 kg/m    Subjective:    Patient ID: Samuel Morris, male    DOB: 1945-10-11, 75 y.o.   MRN: [id number]  HPI: Samuel Morris is a 75 y.o. male  Chief Complaint  Patient presents with  . Cough    Still has and cant seem to get rid of   UPPER RESPIRATORY TRACT INFECTION Duration: Months, has had a change in his cough in the last week or so Worst symptom: cough Fever: no Cough: yes Shortness of breath: yes Wheezing: yes Chest pain: no Chest tightness: no Chest congestion: no Nasal congestion: no Runny nose: no Post nasal drip: no Sneezing: no Sore throat: no Swollen glands: no Sinus pressure: no Headache: no Face pain: no Toothache: no Ear pain: no  Ear pressure:  bilateral Eyes red/itching:no Eye drainage/crusting: no  Vomiting: no Rash: no Fatigue: yes Sick contacts: no Strep contacts: no  Context: worse Recurrent sinusitis: no Relief with OTC cold/cough medications: no  Treatments attempted: tessalon perles, singulair      Relevant past medical, surgical, family and social history reviewed and updated as indicated. Interim medical history since our last visit reviewed. Allergies and medications reviewed and updated.  Review of Systems  Constitutional: Negative.   HENT: Positive for congestion. Negative for dental problem, drooling, ear discharge, ear pain, facial swelling, hearing loss, mouth sores, nosebleeds, postnasal drip, rhinorrhea, sinus pressure, sinus pain, sneezing, sore throat, tinnitus, trouble swallowing and voice change.   Respiratory: Positive for cough, chest tightness, shortness of breath and wheezing. Negative for apnea, choking and stridor.   Cardiovascular: Negative.   Gastrointestinal: Negative.   Musculoskeletal: Negative.   Psychiatric/Behavioral: Negative.     Per HPI unless specifically indicated above     Objective:    BP  125/72   Pulse 76   Temp 98.6 F (37 C) (Oral)   Wt 190 lb 6.4 oz (86.4 kg)   SpO2 96%   BMI 28.95 kg/m   Wt Readings from Last 3 Encounters:  08/12/20 190 lb 6.4 oz (86.4 kg)  07/08/20 194 lb 9.6 oz (88.3 kg)  04/11/20 188 lb 8 oz (85.5 kg)    Physical Exam Vitals and nursing note reviewed.  Constitutional:      General: He is not in acute distress.    Appearance: Normal appearance. He is not ill-appearing, toxic-appearing or diaphoretic.  HENT:     Head: Normocephalic and atraumatic.     Right Ear: External ear normal.     Left Ear: External ear normal.     Nose: Nose normal.     Mouth/Throat:     Mouth: Mucous membranes are moist.     Pharynx: Oropharynx is clear.  Eyes:     General: No scleral icterus.       Right eye: No discharge.        Left eye: No discharge.     Extraocular Movements: Extraocular movements intact.     Conjunctiva/sclera: Conjunctivae normal.     Pupils: Pupils are equal, round, and reactive to light.  Cardiovascular:     Rate and Rhythm: Normal rate and regular rhythm.     Pulses: Normal pulses.     Heart sounds: Normal heart sounds. No murmur heard. No friction rub. No gallop.   Pulmonary:     Effort: Pulmonary effort is  normal. No respiratory distress.     Breath sounds: Normal breath sounds. No stridor. No wheezing, rhonchi or rales.  Chest:     Chest wall: No tenderness.  Musculoskeletal:        General: Normal range of motion.     Cervical back: Normal range of motion and neck supple.  Skin:    General: Skin is warm and dry.     Capillary Refill: Capillary refill takes less than 2 seconds.     Coloration: Skin is not jaundiced or pale.     Findings: No bruising, erythema, lesion or rash.  Neurological:     General: No focal deficit present.     Mental Status: He is alert and oriented to person, place, and time. Mental status is at baseline.  Psychiatric:        Mood and Affect: Mood normal.        Behavior: Behavior normal.         Thought Content: Thought content normal.        Judgment: Judgment normal.     Results for orders placed or performed in visit on 05/20/20  HM DIABETES EYE EXAM  Result Value Ref Range   HM Diabetic Eye Exam No Retinopathy No Retinopathy      Assessment & Plan:   Problem List Items Addressed This Visit      Respiratory   Interstitial lung disease ([place]) - Primary    Being worked up by pulmonology. Chronic cough likely due to this- encouraged him to follow up with his pulmonologist as able. Call with any concerns.        Other Visit Diagnoses    Acute bronchitis, unspecified organism       Will treat with prednisone. If not better in 4-5 days, can start azithromycin. Call with any concerns.        Follow up plan: Return as scheduled.

## 2020-09-08 ENCOUNTER — Other Ambulatory Visit: Payer: Self-pay

## 2020-09-08 ENCOUNTER — Ambulatory Visit (INDEPENDENT_AMBULATORY_CARE_PROVIDER_SITE_OTHER): Payer: Medicare Other | Admitting: Podiatry

## 2020-09-08 DIAGNOSIS — M79675 Pain in left toe(s): Secondary | ICD-10-CM

## 2020-09-08 DIAGNOSIS — E1159 Type 2 diabetes mellitus with other circulatory complications: Secondary | ICD-10-CM

## 2020-09-08 DIAGNOSIS — M79674 Pain in right toe(s): Secondary | ICD-10-CM | POA: Diagnosis not present

## 2020-09-08 DIAGNOSIS — B351 Tinea unguium: Secondary | ICD-10-CM

## 2020-09-13 ENCOUNTER — Encounter: Payer: Self-pay | Admitting: Podiatry

## 2020-09-13 NOTE — Progress Notes (Signed)
  Subjective:  Patient ID: Samuel Morris, male    DOB: 21-Nov-1945,  MRN: [id number]  Chief Complaint  Patient presents with   Nail Problem    Routine foot care    75 y.o. male returns for the above complaint.  Patient presents with thickened elongated dystrophic toenails x10.  Painful to palpation.  Patient would like to have them debrided down.  He is a diabetic with last A1c of 6.0.  He is not able to do it himself.  Objective:  There were no vitals filed for this visit. Podiatric Exam: Vascular: dorsalis pedis and posterior tibial pulses are palpable bilateral. Capillary return is immediate. Temperature gradient is WNL. Skin turgor WNL  Sensorium: Decreased Semmes Weinstein monofilament test.  Decreased tactile sensation bilaterally. Nail Exam: Pt has thick disfigured discolored nails with subungual debris noted bilateral entire nail hallux through fifth toenails.  Pain on palpation to the nails. Ulcer Exam: There is no evidence of ulcer or pre-ulcerative changes or infection. Orthopedic Exam: Muscle tone and strength are WNL. No limitations in general ROM. No crepitus or effusions noted. HAV  B/L.  Hammer toes 2-5  B/L. Skin: No Porokeratosis. No infection or ulcers    Assessment & Plan:   1. Type 2 diabetes mellitus with cardiac complication (HCC)   2. Pain due to onychomycosis of toenails of both feet     Patient was evaluated and treated and all questions answered.  Onychomycosis with pain  -Nails palliatively debrided as below. -Educated on self-care  Procedure: Nail Debridement Rationale: pain  Type of Debridement: manual, sharp debridement. Instrumentation: Nail nipper, rotary burr. Number of Nails: 10  Procedures and Treatment: Consent by patient was obtained for treatment procedures. The patient understood the discussion of treatment and procedures well. All questions were answered thoroughly reviewed. Debridement of mycotic and hypertrophic toenails, 1 through 5  bilateral and clearing of subungual debris. No ulceration, no infection noted.  Return Visit-Office Procedure: Patient instructed to return to the office for a follow up visit 3 months for continued evaluation and treatment.  Boneta Lucks, DPM    No follow-ups on file.

## 2020-10-04 NOTE — Progress Notes (Signed)
Electrophysiology Office Note:    Date:  10/05/2020   ID:  Samuel Morris, DOB 12-03-1945, MRN [id number]  PCP:  Valerie Roys, DO  [facility] Cardiologist:  None  [facility] Electrophysiologist:  Vickie Epley, MD   Referring MD: Valerie Roys, DO   Chief Complaint: VT  History of Present Illness:    Samuel Morris is a 75 y.o. male who presents for an evaluation of VT and ICD care at the request of Dr Wynetta Emery. Their medical history includes CAD, HTN, OSA, HLD, DM valvular heart disease and VT s/p ICD implant. He was previously followed by Dr. Jeralyn Bennett in [place] at [facility].  He was last seen there Jul 13, 2020.  The ICD was implanted in 2012 with a subsequent generator replacement in 2019.  According to that note, the patient did have an appropriate ICD shock for ventricular fibrillation on August 31, 2019  Today he is with his wife in clinic who I am also seeing.  No issues with his ICD recently.  He tells me that after his aortic valve replacement his sternum did not completely heal.  He has not had an echo to reassess his aortic valve for approximately 3 years.  Past Medical History:  Diagnosis Date   Cancer ([place])    Scalp   Cataract    Diabetes mellitus without complication ([name])    Heart attack ([place])    Heart disease    Hyperlipidemia    Hypertension    Sleep apnea    Stenosis of aorta     Past Surgical History:  Procedure Laterality Date   CARDIAC DEFIBRILLATOR PLACEMENT     CARDIAC SURGERY      Current Medications: Current Meds  Medication Sig   albuterol (VENTOLIN HFA) 108 (90 Base) MCG/ACT inhaler Inhale 1 puff into the lungs every 4 (four) hours as needed for wheezing or shortness of breath.   Ascorbic Acid (VITAMIN C PO) Take 100 mg by mouth daily.   aspirin EC 81 MG tablet Take by mouth.   benzonatate (TESSALON) 100 MG capsule Take 1 capsule (100 mg total) by mouth 2 (two) times daily as needed for cough.   Calcium-Magnesium 100-50  MG TABS Take by mouth.   Cholecalciferol (VITAMIN D-1000 MAX ST) 25 MCG (1000 UT) tablet Take by mouth.   diclofenac Sodium (VOLTAREN) 1 % GEL SMARTSIG:4 Gram(s) Topical Twice Daily   ENTRESTO 49-51 MG TK 1 T PO BID   ezetimibe (ZETIA) 10 MG tablet Take 1 tablet (10 mg total) by mouth daily.   GLUCOSAMINE-CHONDROITIN PO Take 1 tablet by mouth daily.   meloxicam (MOBIC) 15 MG tablet Take 15 mg by mouth daily.   metoprolol succinate (TOPROL-XL) 100 MG 24 hr tablet Take 1.5 tabs daily. Take with or immediately following a meal.   nitroGLYCERIN (NITROSTAT) 0.4 MG SL tablet Place 1 tablet (0.4 mg total) under the tongue every 5 (five) minutes as needed for chest pain.   predniSONE (DELTASONE) 10 MG tablet 6 tabs today and tomorrow, 5 tabs the next 2 days, decrease by 1 every other day until gone   rosuvastatin (CRESTOR) 20 MG tablet Take 1 tablet (20 mg total) by mouth at bedtime.   spironolactone (ALDACTONE) 25 MG tablet Take 1 tablet (25 mg total) by mouth daily.   vitamin B-12 (CYANOCOBALAMIN) 1000 MCG tablet Take by mouth.     Allergies:   Patient has no known allergies.   Social History   Socioeconomic History  Marital status: Married    Spouse name: Not on file   Number of children: Not on file   Years of education: Not on file   Highest education level: Not on file  Occupational History   Occupation: retired  Tobacco Use   Smoking status: Never   Smokeless tobacco: Never  Vaping Use   Vaping Use: Never used  Substance and Sexual Activity   Alcohol use: Yes    Comment: On occasion   Drug use: Never   Sexual activity: Yes    Birth control/protection: None  Other Topics Concern   Not on file  Social History Narrative   Not on file   Social Determinants of Health   Financial Resource Strain: Low Risk    Difficulty of Paying Living Expenses: Not hard at all  Food Insecurity: No Food Insecurity   Worried About Charity fundraiser in the Last Year: Never true   [place] in the Last Year: Never true  Transportation Needs: No Transportation Needs   Lack of Transportation (Medical): No   Lack of Transportation (Non-Medical): No  Physical Activity: Inactive   Days of Exercise per Week: 0 days   Minutes of Exercise per Session: 0 min  Stress: No Stress Concern Present   Feeling of Stress : Not at all  Social Connections: Not on file     Family History: The patient's family history includes Arthritis in his sister; Heart attack in his father; Heart disease in his mother and paternal grandmother; Heart murmur in his sister; Hypertension in his brother and sister.  ROS:   Please see the history of present illness.    All other systems reviewed and are negative.  EKGs/Labs/Other Studies Reviewed:    The following studies were reviewed today:  October 05, 2020 in clinic device interrogation personally reviewed Battery longevity 8 years Lead parameters are excellent.  Since Jul 13, 2020 no treated arrhythmias. Less than 0.1% AT/AF burden Ventricular pacing 0.2% Atrially pacing 8.4%   EKG:  The ekg ordered today demonstrates sinus rhythm  Recent Labs: 04/11/2020: ALT 39; BUN 13; Creatinine, Ser 0.90; Hemoglobin 16.3; Platelets 260; Potassium 5.0; Sodium 140  Recent Lipid Panel    Component Value Date/Time   CHOL 131 04/11/2020 1006   TRIG 92 04/11/2020 1006   HDL 37 (L) 04/11/2020 1006   LDLCALC 76 04/11/2020 1006    Physical Exam:    VS:  BP 130/78 (BP Location: Left Arm, Patient Position: Sitting, Cuff Size: Normal)   Pulse 70   Ht '5\' 6"'$  (1.676 m)   Wt 191 lb (86.6 kg)   SpO2 97%   BMI 30.83 kg/m     Wt Readings from Last 3 Encounters:  10/05/20 191 lb (86.6 kg)  08/12/20 190 lb 6.4 oz (86.4 kg)  07/08/20 194 lb 9.6 oz (88.3 kg)     GEN:  Well nourished, well developed in no acute distress HEENT: Normal NECK: No JVD; No carotid bruits LYMPHATICS: No lymphadenopathy CARDIAC: RRR, no murmurs, rubs, gallops.  ICD pocket  well-healed RESPIRATORY:  Clear to auscultation without rales, wheezing or rhonchi  ABDOMEN: Soft, non-tender, non-distended MUSCULOSKELETAL:  No edema; No deformity  SKIN: Warm and dry NEUROLOGIC:  Alert and oriented x 3 PSYCHIATRIC:  Normal affect   ASSESSMENT:    1. ICD (implantable cardioverter-defibrillator) in place   2. Coronary artery disease involving native coronary artery of native heart, unspecified whether angina present   3. VT (ventricular tachycardia) ([place])  4. Type 2 diabetes mellitus with cardiac complication ([place])   5. Primary hypertension   6. History of aortic valve replacement    PLAN:    In order of problems listed above:  1. ICD (implantable cardioverter-defibrillator) in place ICD functioning well on interrogation today.  We will transition remote monitoring to our clinic.  2. Coronary artery disease involving native coronary artery of native heart, unspecified whether angina present No ischemic symptoms.  Continue medical therapy including aspirin  3. VT (ventricular tachycardia) ([facility]) No recurrent episodes on today's interrogation.  Continue metoprolol succinate  4. Type 2 diabetes mellitus with cardiac complication ([facility])   5. Primary hypertension Controlled.  Continue current regimen  6. History of aortic valve replacement No concerns for prosthetic dysfunction on today's physical exam.  I will update his echocardiogram given it has been about 3 years since his last echo.  Continue medical therapy with Vladimir Faster, Jardiance, Farxiga, aspirin, spironolactone.   Follow-up 1 year or sooner as needed.   Total time spent with patient today 65 minutes. This includes reviewing records, evaluating the patient and coordinating care.  Medication Adjustments/Labs and Tests Ordered: Current medicines are reviewed at length with the patient today.  Concerns regarding medicines are outlined above.  Orders Placed This Encounter  Procedures   EKG  12-Lead   ECHOCARDIOGRAM COMPLETE    No orders of the defined types were placed in this encounter.    Signed, Hilton Cork. Quentin Ore, MD, [facility], [facility] 10/05/2020 1:46 PM    Electrophysiology [facility]

## 2020-10-05 ENCOUNTER — Ambulatory Visit (INDEPENDENT_AMBULATORY_CARE_PROVIDER_SITE_OTHER): Payer: Medicare Other | Admitting: Cardiology

## 2020-10-05 ENCOUNTER — Other Ambulatory Visit: Payer: Self-pay

## 2020-10-05 ENCOUNTER — Encounter: Payer: Self-pay | Admitting: Cardiology

## 2020-10-05 VITALS — BP 130/78 | HR 70 | Ht 66.0 in | Wt 191.0 lb

## 2020-10-05 DIAGNOSIS — Z952 Presence of prosthetic heart valve: Secondary | ICD-10-CM | POA: Diagnosis not present

## 2020-10-05 DIAGNOSIS — I10 Essential (primary) hypertension: Secondary | ICD-10-CM

## 2020-10-05 DIAGNOSIS — I2510 Atherosclerotic heart disease of native coronary artery without angina pectoris: Secondary | ICD-10-CM | POA: Diagnosis not present

## 2020-10-05 DIAGNOSIS — I472 Ventricular tachycardia, unspecified: Secondary | ICD-10-CM

## 2020-10-05 DIAGNOSIS — E1159 Type 2 diabetes mellitus with other circulatory complications: Secondary | ICD-10-CM

## 2020-10-05 DIAGNOSIS — Z95810 Presence of automatic (implantable) cardiac defibrillator: Secondary | ICD-10-CM | POA: Diagnosis not present

## 2020-10-05 NOTE — Patient Instructions (Addendum)
Medication Instructions:  Your physician recommends that you continue on your current medications as directed. Please refer to the Current Medication list given to you today. *If you need a refill on your cardiac medications before your next appointment, please call your pharmacy*  Lab Work: None ordered. If you have labs (blood work) drawn today and your tests are completely normal, you will receive your results only by: [place] (if you have MyChart) OR A paper copy in the mail If you have any lab test that is abnormal or we need to change your treatment, we will call you to review the results.  Testing/Procedures: Your physician has requested that you have an echocardiogram. Echocardiography is a painless test that uses sound waves to create images of your heart. It provides your doctor with information about the size and shape of your heart and how well your heart's chambers and valves are working. This procedure takes approximately one hour. There are no restrictions for this procedure.  Please schedule for ECHO  Follow-Up: At [facility], you and your health needs are our priority.  As part of our continuing mission to provide you with exceptional heart care, we have created designated Provider Care Teams.  These Care Teams include your primary Cardiologist (physician) and Advanced Practice Providers (APPs -  Physician Assistants and Nurse Practitioners) who all work together to provide you with the care you need, when you need it.  Your next appointment:   Your physician wants you to follow-up in: one year with Dr. Quentin Ore.   You will receive a reminder letter in the mail two months in advance. If you don't receive a letter, please call our office to schedule the follow-up appointment.  You will be set up with remote monitoring.  Device clinic phone # [phone].

## 2020-10-11 ENCOUNTER — Other Ambulatory Visit: Payer: Self-pay

## 2020-10-11 ENCOUNTER — Encounter: Payer: Self-pay | Admitting: Family Medicine

## 2020-10-11 ENCOUNTER — Ambulatory Visit (INDEPENDENT_AMBULATORY_CARE_PROVIDER_SITE_OTHER): Payer: Medicare Other | Admitting: Family Medicine

## 2020-10-11 VITALS — BP 109/68 | HR 73 | Temp 98.2°F | Wt 191.8 lb

## 2020-10-11 DIAGNOSIS — I209 Angina pectoris, unspecified: Secondary | ICD-10-CM | POA: Diagnosis not present

## 2020-10-11 DIAGNOSIS — E1169 Type 2 diabetes mellitus with other specified complication: Secondary | ICD-10-CM

## 2020-10-11 DIAGNOSIS — Z23 Encounter for immunization: Secondary | ICD-10-CM

## 2020-10-11 DIAGNOSIS — S51802A Unspecified open wound of left forearm, initial encounter: Secondary | ICD-10-CM

## 2020-10-11 DIAGNOSIS — E785 Hyperlipidemia, unspecified: Secondary | ICD-10-CM

## 2020-10-11 DIAGNOSIS — E1159 Type 2 diabetes mellitus with other circulatory complications: Secondary | ICD-10-CM | POA: Diagnosis not present

## 2020-10-11 DIAGNOSIS — M069 Rheumatoid arthritis, unspecified: Secondary | ICD-10-CM

## 2020-10-11 DIAGNOSIS — D692 Other nonthrombocytopenic purpura: Secondary | ICD-10-CM | POA: Insufficient documentation

## 2020-10-11 DIAGNOSIS — E782 Mixed hyperlipidemia: Secondary | ICD-10-CM | POA: Diagnosis not present

## 2020-10-11 DIAGNOSIS — I10 Essential (primary) hypertension: Secondary | ICD-10-CM | POA: Diagnosis not present

## 2020-10-11 LAB — BAYER DCA HB A1C WAIVED: HB A1C (BAYER DCA - WAIVED): 6.9 % (ref <7.0)

## 2020-10-11 MED ORDER — SPIRONOLACTONE 25 MG PO TABS: 25.0000 mg | ORAL_TABLET | Freq: Every day | ORAL | 1 refills | Status: DC

## 2020-10-11 MED ORDER — METOPROLOL SUCCINATE ER 100 MG PO TB24: ORAL_TABLET | ORAL | 1 refills | Status: DC

## 2020-10-11 MED ORDER — EZETIMIBE 10 MG PO TABS: 10.0000 mg | ORAL_TABLET | Freq: Every day | ORAL | 1 refills | Status: DC

## 2020-10-11 MED ORDER — ROSUVASTATIN CALCIUM 20 MG PO TABS: 20.0000 mg | ORAL_TABLET | Freq: Every day | ORAL | 1 refills | Status: DC

## 2020-10-11 NOTE — Assessment & Plan Note (Signed)
Doing well with A1c of 6.9. Likely up due to prednisone use. Continue current regimen. Continue to monitor. Call with any concerns.

## 2020-10-11 NOTE — Assessment & Plan Note (Signed)
Following with rheumatology. Currently on prednisone. Rheum would like to start him on plaquinel, but needs to see ophthalmology for baseline eye exam first- urgent referral to ophthalmology placed today. Advised against taking meloxicam with prednisone. Follow up with rheumology as needed.

## 2020-10-11 NOTE — Assessment & Plan Note (Signed)
Under good control on current regimen. Continue current regimen. Continue to monitor. Call with any concerns. Refills given. Labs drawn today.   

## 2020-10-11 NOTE — Assessment & Plan Note (Signed)
Reassured patient. Continue to monitor.  

## 2020-10-11 NOTE — Progress Notes (Signed)
BP 109/68   Pulse 73   Temp 98.2 F (36.8 C) (Oral)   Wt 191 lb 12.8 oz (87 kg)   SpO2 96%   BMI 30.96 kg/m    Subjective:    Patient ID: Samuel Morris, male    DOB: [birth year], 75 y.o.   MRN: [id number]  HPI: Samuel Morris is a 75 y.o. male  Chief Complaint  Patient presents with   Diabetes   Hyperlipidemia   Arthritis    Patient states he has been dealing with rheumatoid arthritis for the past couple of months. Patient states he does she a rheumatologist. Patient states he would like to discuss medications with provider at today's visit.     HYPERTENSION / HYPERLIPIDEMIA Satisfied with current treatment? yes Duration of hypertension: chronic BP monitoring frequency: a few times a month BP medication side effects: no Duration of hyperlipidemia: chronic Cholesterol medication side effects: no Cholesterol supplements: none Past cholesterol medications: crestor, zetia Medication compliance: excellent compliance Aspirin: yes Recent stressors: no Recurrent headaches: no Visual changes: no Palpitations: no Dyspnea: no Chest pain: no Lower extremity edema: no Dizzy/lightheaded: no  DIABETES Hypoglycemic episodes:no Polydipsia/polyuria: yes Visual disturbance: no Chest pain: no Paresthesias: no Glucose Monitoring: yes  Accucheck frequency: Daily Taking Insulin?: no Blood Pressure Monitoring: not checking Retinal Examination: Up to Date Foot Exam: Up to Date Diabetic Education: Completed Pneumovax: Up to Date Influenza: Up to Date Aspirin: yes  Relevant past medical, surgical, family and social history reviewed and updated as indicated. Interim medical history since our last visit reviewed. Allergies and medications reviewed and updated.  Review of Systems  Constitutional: Negative.   Respiratory: Negative.    Cardiovascular: Negative.   Gastrointestinal: Negative.   Musculoskeletal:  Positive for arthralgias. Negative for back pain, gait problem, joint  swelling, myalgias, neck pain and neck stiffness.  Skin: Negative.   Psychiatric/Behavioral: Negative.     Per HPI unless specifically indicated above     Objective:    BP 109/68   Pulse 73   Temp 98.2 F (36.8 C) (Oral)   Wt 191 lb 12.8 oz (87 kg)   SpO2 96%   BMI 30.96 kg/m   Wt Readings from Last 3 Encounters:  10/11/20 191 lb 12.8 oz (87 kg)  10/05/20 191 lb (86.6 kg)  08/12/20 190 lb 6.4 oz (86.4 kg)    Physical Exam Vitals and nursing note reviewed.  Constitutional:      General: He is not in acute distress.    Appearance: Normal appearance. He is not ill-appearing, toxic-appearing or diaphoretic.  HENT:     Head: Normocephalic and atraumatic.     Right Ear: External ear normal.     Left Ear: External ear normal.     Nose: Nose normal.     Mouth/Throat:     Mouth: Mucous membranes are moist.     Pharynx: Oropharynx is clear.  Eyes:     General: No scleral icterus.       Right eye: No discharge.        Left eye: No discharge.     Extraocular Movements: Extraocular movements intact.     Conjunctiva/sclera: Conjunctivae normal.     Pupils: Pupils are equal, round, and reactive to light.  Cardiovascular:     Rate and Rhythm: Normal rate and regular rhythm.     Pulses: Normal pulses.     Heart sounds: Normal heart sounds. No murmur heard.   No friction rub. No gallop.  Pulmonary:  Effort: Pulmonary effort is normal. No respiratory distress.     Breath sounds: Normal breath sounds. No stridor. No wheezing, rhonchi or rales.  Chest:     Chest wall: No tenderness.  Musculoskeletal:        General: Normal range of motion.     Cervical back: Normal range of motion and neck supple.  Skin:    General: Skin is warm and dry.     Capillary Refill: Capillary refill takes less than 2 seconds.     Coloration: Skin is not jaundiced or pale.     Findings: No bruising, erythema, lesion or rash.     Comments: Well healing wound on L forearm  Neurological:     General:  No focal deficit present.     Mental Status: He is alert and oriented to person, place, and time. Mental status is at baseline.  Psychiatric:        Mood and Affect: Mood normal.        Behavior: Behavior normal.        Thought Content: Thought content normal.        Judgment: Judgment normal.    Results for orders placed or performed in visit on 10/11/20  Bayer DCA Hb A1c Waived  Result Value Ref Range   HB A1C (BAYER DCA - WAIVED) 6.9 <7.0 %      Assessment & Plan:   Problem List Items Addressed This Visit       Cardiovascular and Mediastinum   Type 2 diabetes mellitus with cardiac complication ([place])    Doing well with A1c of 6.9. Likely up due to prednisone use. Continue current regimen. Continue to monitor. Call with any concerns.        Relevant Medications   spironolactone (ALDACTONE) 25 MG tablet   rosuvastatin (CRESTOR) 20 MG tablet   metoprolol succinate (TOPROL-XL) 100 MG 24 hr tablet   ezetimibe (ZETIA) 10 MG tablet   Other Relevant Orders   Bayer DCA Hb A1c Waived (Completed)   CBC with Differential/Platelet   Comprehensive metabolic panel   HTN (hypertension) - Primary    Under good control on current regimen. Continue current regimen. Continue to monitor. Call with any concerns. Refills given. Labs drawn today.         Relevant Medications   spironolactone (ALDACTONE) 25 MG tablet   rosuvastatin (CRESTOR) 20 MG tablet   metoprolol succinate (TOPROL-XL) 100 MG 24 hr tablet   ezetimibe (ZETIA) 10 MG tablet   Other Relevant Orders   CBC with Differential/Platelet   Comprehensive metabolic panel   Senile purpura ([place])    Reassured patient. Continue to monitor.        Relevant Medications   spironolactone (ALDACTONE) 25 MG tablet   rosuvastatin (CRESTOR) 20 MG tablet   metoprolol succinate (TOPROL-XL) 100 MG 24 hr tablet   ezetimibe (ZETIA) 10 MG tablet     Endocrine   Hyperlipidemia associated with type 2 diabetes mellitus ([place])    Under good  control on current regimen. Continue current regimen. Continue to monitor. Call with any concerns. Refills given. Labs drawn today.         Relevant Medications   rosuvastatin (CRESTOR) 20 MG tablet   ezetimibe (ZETIA) 10 MG tablet   Other Relevant Orders   CBC with Differential/Platelet   Comprehensive metabolic panel     Musculoskeletal and Integument   Rheumatoid arthritis [facility])    Following with rheumatology. Currently on prednisone. Rheum would like to start him  on plaquinel, but needs to see ophthalmology for baseline eye exam first- urgent referral to ophthalmology placed today. Advised against taking meloxicam with prednisone. Follow up with rheumology as needed.        Relevant Orders   Ambulatory referral to Ophthalmology     Other   Hyperlipidemia    Under good control on current regimen. Continue current regimen. Continue to monitor. Call with any concerns. Refills given. Labs drawn today.         Relevant Medications   spironolactone (ALDACTONE) 25 MG tablet   rosuvastatin (CRESTOR) 20 MG tablet   metoprolol succinate (TOPROL-XL) 100 MG 24 hr tablet   ezetimibe (ZETIA) 10 MG tablet   Other Relevant Orders   CBC with Differential/Platelet   Comprehensive metabolic panel   Lipid Panel w/o Chol/HDL Ratio   Other Visit Diagnoses     Open wound of left forearm, initial encounter       Td given today.   Relevant Orders   Td : Tetanus/diphtheria >7yo Preservative  free (Completed)        Follow up plan: Return in about 6 months (around 04/13/2021), or follow up, for records release from rheumatology.

## 2020-10-13 LAB — CBC WITH DIFFERENTIAL/PLATELET
Basophils Absolute: 0.1 10*3/uL (ref 0.0–0.2)
Basos: 0 %
EOS (ABSOLUTE): 0.4 10*3/uL (ref 0.0–0.4)
Eos: 3 %
Hematocrit: 42.1 % (ref 37.5–51.0)
Hemoglobin: 14 g/dL (ref 13.0–17.7)
Immature Grans (Abs): 0 10*3/uL (ref 0.0–0.1)
Immature Granulocytes: 0 %
Lymphocytes Absolute: 1.1 10*3/uL (ref 0.7–3.1)
Lymphs: 9 %
MCH: 29.2 pg (ref 26.6–33.0)
MCHC: 33.3 g/dL (ref 31.5–35.7)
MCV: 88 fL (ref 79–97)
Monocytes Absolute: 0.9 10*3/uL (ref 0.1–0.9)
Monocytes: 8 %
Neutrophils Absolute: 9 10*3/uL — ABNORMAL HIGH (ref 1.4–7.0)
Neutrophils: 80 %
Platelets: 214 10*3/uL (ref 150–450)
RBC: 4.79 x10E6/uL (ref 4.14–5.80)
RDW: 14.5 % (ref 11.6–15.4)
WBC: 11.4 10*3/uL — ABNORMAL HIGH (ref 3.4–10.8)

## 2020-10-13 LAB — LIPID PANEL W/O CHOL/HDL RATIO
Cholesterol, Total: 130 mg/dL (ref 100–199)
HDL: 54 mg/dL (ref >39)
LDL Chol Calc (NIH): 58 mg/dL (ref 0–99)
Triglycerides: 98 mg/dL (ref 0–149)
VLDL Cholesterol Cal: 18 mg/dL (ref 5–40)

## 2020-10-13 LAB — COMPREHENSIVE METABOLIC PANEL
ALT: 29 IU/L (ref 0–44)
AST: 20 IU/L (ref 0–40)
Albumin/Globulin Ratio: 2.6 — ABNORMAL HIGH (ref 1.2–2.2)
Albumin: 4.2 g/dL (ref 3.7–4.7)
Alkaline Phosphatase: 75 IU/L (ref 44–121)
BUN/Creatinine Ratio: 27 — ABNORMAL HIGH (ref 10–24)
BUN: 23 mg/dL (ref 8–27)
Bilirubin Total: 0.4 mg/dL (ref 0.0–1.2)
CO2: 16 mmol/L — ABNORMAL LOW (ref 20–29)
Calcium: 9.5 mg/dL (ref 8.6–10.2)
Chloride: 99 mmol/L (ref 96–106)
Creatinine, Ser: 0.86 mg/dL (ref 0.76–1.27)
Globulin, Total: 1.6 g/dL (ref 1.5–4.5)
Glucose: 219 mg/dL — ABNORMAL HIGH (ref 65–99)
Potassium: 4.8 mmol/L (ref 3.5–5.2)
Sodium: 136 mmol/L (ref 134–144)
Total Protein: 5.8 g/dL — ABNORMAL LOW (ref 6.0–8.5)
eGFR: 90 mL/min/{1.73_m2} (ref >59)

## 2020-10-14 ENCOUNTER — Ambulatory Visit (INDEPENDENT_AMBULATORY_CARE_PROVIDER_SITE_OTHER): Payer: Medicare Other

## 2020-10-14 DIAGNOSIS — I472 Ventricular tachycardia, unspecified: Secondary | ICD-10-CM

## 2020-10-17 LAB — CUP PACEART REMOTE DEVICE CHECK
Battery Remaining Longevity: 96 mo
Battery Voltage: 3 V
Brady Statistic AP VP Percent: 0.17 %
Brady Statistic AP VS Percent: 6.45 %
Brady Statistic AS VP Percent: 0.03 %
Brady Statistic AS VS Percent: 93.36 %
Brady Statistic RA Percent Paced: 6.57 %
Brady Statistic RV Percent Paced: 0.19 %
Date Time Interrogation Session: 20220805013723
HighPow Impedance: 76 Ohm
Implantable Lead Implant Date: 20121205
Implantable Lead Implant Date: 20121205
Implantable Lead Location: 753859
Implantable Lead Location: 753860
Implantable Lead Model: 5076
Implantable Pulse Generator Implant Date: 20190702
Lead Channel Impedance Value: 399 Ohm
Lead Channel Impedance Value: 456 Ohm
Lead Channel Impedance Value: 494 Ohm
Lead Channel Pacing Threshold Amplitude: 0.625 V
Lead Channel Pacing Threshold Amplitude: 0.875 V
Lead Channel Pacing Threshold Pulse Width: 0.4 ms
Lead Channel Pacing Threshold Pulse Width: 0.4 ms
Lead Channel Sensing Intrinsic Amplitude: 1.875 mV
Lead Channel Sensing Intrinsic Amplitude: 1.875 mV
Lead Channel Sensing Intrinsic Amplitude: 23 mV
Lead Channel Sensing Intrinsic Amplitude: 23 mV
Lead Channel Setting Pacing Amplitude: 1.75 V
Lead Channel Setting Pacing Amplitude: 2 V
Lead Channel Setting Pacing Pulse Width: 0.4 ms
Lead Channel Setting Sensing Sensitivity: 0.3 mV

## 2020-11-03 NOTE — Progress Notes (Signed)
Remote ICD transmission.   

## 2020-11-16 ENCOUNTER — Telehealth: Payer: Self-pay

## 2020-11-16 ENCOUNTER — Encounter: Payer: Self-pay | Admitting: Nurse Practitioner

## 2020-11-16 ENCOUNTER — Telehealth (INDEPENDENT_AMBULATORY_CARE_PROVIDER_SITE_OTHER): Payer: Medicare Other | Admitting: Nurse Practitioner

## 2020-11-16 DIAGNOSIS — U071 COVID-19: Secondary | ICD-10-CM | POA: Diagnosis not present

## 2020-11-16 HISTORY — DX: COVID-19: U07.1

## 2020-11-16 MED ORDER — AZITHROMYCIN 250 MG PO TABS: ORAL_TABLET | ORAL | 0 refills | Status: AC

## 2020-11-16 NOTE — Telephone Encounter (Signed)
Copied from [place] [id number]. Topic: General - Other >> Nov 16, 2020  9:00 AM Valere Dross wrote: Reason for CRM: Pt called in stating he has bronchitis and covid, and wanted to see if PCP could send him some antidotic to help, pt requested a call back.   Patient would need an appointment for any medications to be sent in. Please call to schedule an appointment.

## 2020-11-16 NOTE — Progress Notes (Signed)
There were no vitals taken for this visit.   Subjective:    Patient ID: Samuel Morris, male    DOB: Feb 24, 1946, 75 y.o.   MRN: [id number]  HPI: Samuel Morris is a 75 y.o. male  Chief Complaint  Patient presents with   Covid Positive    Tested positive at home on Monday evening, has been having a cough and chest congestion.   Sinusitis   UPPER RESPIRATORY TRACT INFECTION- tested positive for COVID on Monday evening.  Given Paxlovid by Rheumatologist last night. Symptoms started a few days prior to testing positive. Worst symptom: cough Fever: no Cough: yes Shortness of breath: no Wheezing: yes Chest pain: no Chest tightness: no Chest congestion: yes Nasal congestion: no Runny nose: no Post nasal drip: yes Sneezing: no Sore throat: no Swollen glands: no Sinus pressure: no Headache: no Face pain: no Toothache: no Ear pain: no bilateral Ear pressure: no bilateral Eyes red/itching:no Eye drainage/crusting: no  Vomiting: no Rash: no Fatigue: yes Sick contacts: no Strep contacts: no  Context: stable Recurrent sinusitis: no Relief with OTC cold/cough medications: yes  Treatments attempted: mucinex    Relevant past medical, surgical, family and social history reviewed and updated as indicated. Interim medical history since our last visit reviewed. Allergies and medications reviewed and updated.  Review of Systems  Constitutional:  Negative for fatigue and fever.  HENT:  Positive for postnasal drip. Negative for congestion, ear pain, rhinorrhea, sinus pressure, sinus pain, sneezing and sore throat.   Respiratory:  Positive for cough and wheezing. Negative for chest tightness and shortness of breath.   Gastrointestinal:  Negative for vomiting.  Skin:  Negative for rash.  Neurological:  Negative for headaches.   Per HPI unless specifically indicated above     Objective:    There were no vitals taken for this visit.  Wt Readings from Last 3 Encounters:  10/11/20  191 lb 12.8 oz (87 kg)  10/05/20 191 lb (86.6 kg)  08/12/20 190 lb 6.4 oz (86.4 kg)    Physical Exam Vitals and nursing note reviewed.  HENT:     Right Ear: Hearing normal.     Left Ear: Hearing normal.  Pulmonary:     Effort: Pulmonary effort is normal. No respiratory distress.  Neurological:     Mental Status: He is alert.  Psychiatric:        Mood and Affect: Mood normal.        Behavior: Behavior normal.        Thought Content: Thought content normal.        Judgment: Judgment normal.    Results for orders placed or performed in visit on 10/14/20  CUP PACEART REMOTE DEVICE CHECK  Result Value Ref Range   Date Time Interrogation Session [id number]    Pulse Generator Manufacturer MERM    Pulse Gen Model [id number] Evera MRI XT DR    Pulse Gen Serial Number [id number]    Clinic Name [facility]    Implantable Pulse Generator Type Implantable Cardiac Defibulator    Implantable Pulse Generator Implant Date [id number]    Implantable Lead Manufacturer [facility]    Implantable Lead Model 5076 CapSureFix Novus MRI SureScan    Implantable Lead Serial Number [id number]    Implantable Lead Implant Date [id number]    Implantable Lead Location Detail 1 UNKNOWN    Implantable Lead Location [id number]    Implantable Lead Manufacturer [facility]    Implantable Lead Model [id number] Sprint Quattro Secure S MRI SureScan  Implantable Lead Serial Number [id number]    Implantable Lead Implant Date WM:[id number]    Implantable Lead Location Detail 1 UNKNOWN    Implantable Lead Location [phone]    Lead Channel Setting Sensing Sensitivity 0.3 mV   Lead Channel Setting Pacing Amplitude 1.75 V   Lead Channel Setting Pacing Pulse Width 0.4 ms   Lead Channel Setting Pacing Amplitude 2 V   Lead Channel Impedance Value 494 ohm   Lead Channel Sensing Intrinsic Amplitude 1.875 mV   Lead Channel Sensing Intrinsic Amplitude 1.875 mV   Lead Channel Pacing Threshold Amplitude 0.875 V   Lead Channel Pacing Threshold Pulse  Width 0.4 ms   Lead Channel Impedance Value 456 ohm   Lead Channel Impedance Value 399 ohm   Lead Channel Sensing Intrinsic Amplitude 23 mV   Lead Channel Sensing Intrinsic Amplitude 23 mV   Lead Channel Pacing Threshold Amplitude 0.625 V   Lead Channel Pacing Threshold Pulse Width 0.4 ms   HighPow Impedance 76 ohm   Battery Status OK    Battery Remaining Longevity 96 mo   Battery Voltage 3.00 V   Brady Statistic RA Percent Paced 6.57 %   Brady Statistic RV Percent Paced 0.19 %   Brady Statistic AP VP Percent 0.17 %   Brady Statistic AS VP Percent 0.03 %   Brady Statistic AP VS Percent 6.45 %   Brady Statistic AS VS Percent 93.36 %      Assessment & Plan:   Problem List Items Addressed This Visit       Other   COVID-19 - Primary    Continue Paxlovid and Prednisone given by Rheum. Patient given Zpak. Can use albuterol PRN for wheezing. FU if symptoms worsen. Reviewed quarantine.      Relevant Medications   PAXLOVID, 300/100, 20 x 150 MG & 10 x '100MG'$  TBPK   azithromycin (ZITHROMAX) 250 MG tablet     Follow up plan: Return if symptoms worsen or fail to improve.   This visit was completed via MyChart due to the restrictions of the COVID-19 pandemic. All issues as above were discussed and addressed. Physical exam was done as above through visual confirmation on MyChart. If it was felt that the patient should be evaluated in the office, they were directed there. The patient verbally consented to this visit. Location of the patient: Home Location of the provider: Office Those involved with this call:  Provider: Jon Billings, NP CMA: Jodie Echevaria, [place] Desk/Registration: Jodie Echevaria, CMA This encounter was conducted via phone.  I spent 15 dedicated to the care of this patient on the date of this encounter to include previsit review of 21, face to face time with the patient, and post visit ordering of testing.

## 2020-11-16 NOTE — Telephone Encounter (Signed)
Attempted to schedule patient with Jon Billings, NP at 3:20pm (My Chart virtually) and was unable too. Practice aware and did state they will follow up with patient regarding working him in due to him being dx with COVID and requesting antibiotics.

## 2020-11-16 NOTE — Assessment & Plan Note (Signed)
Continue Paxlovid and Prednisone given by Rheum. Patient given Zpak. Can use albuterol PRN for wheezing. FU if symptoms worsen. Reviewed quarantine.

## 2020-11-18 NOTE — Telephone Encounter (Signed)
Pt was seen on 11/16/2020 by Santiago Glad

## 2020-11-28 ENCOUNTER — Other Ambulatory Visit: Payer: Self-pay

## 2020-11-28 ENCOUNTER — Ambulatory Visit (INDEPENDENT_AMBULATORY_CARE_PROVIDER_SITE_OTHER): Payer: Medicare Other

## 2020-11-28 DIAGNOSIS — Z952 Presence of prosthetic heart valve: Secondary | ICD-10-CM | POA: Diagnosis not present

## 2020-11-29 LAB — ECHOCARDIOGRAM COMPLETE
AR max vel: 1.81 cm2
AV Area VTI: 1.89 cm2
AV Area mean vel: 1.8 cm2
AV Mean grad: 10 mmHg
AV Peak grad: 16.8 mmHg
Ao pk vel: 2.05 m/s
Area-P 1/2: 3.21 cm2
Calc EF: 51 %
S' Lateral: 3.5 cm
Single Plane A2C EF: 30.9 %
Single Plane A4C EF: 65.2 %

## 2020-12-06 ENCOUNTER — Telehealth: Payer: Self-pay | Admitting: Cardiology

## 2020-12-06 NOTE — Telephone Encounter (Signed)
   [facility] Pre-operative Risk Assessment    Patient Name: Samuel Morris  DOB: Jan 08, 1946 MRN: [id number]  HEARTCARE STAFF:  - IMPORTANT!!!!!! Under Visit Info/Reason for Call, type in Other and utilize the format Clearance MM/DD/YY or Clearance TBD. Do not use dashes or single digits. - Please review there is not already an duplicate clearance open for this procedure. - If request is for dental extraction, please clarify the # of teeth to be extracted. - If the patient is currently at the dentist's office, call Pre-Op Callback Staff (MA/nurse) to input urgent request.  - If the patient is not currently in the dentist office, please route to the Pre-Op pool.  Request for surgical clearance:  What type of surgery is being performed? RT TKA traditional knee  When is this surgery scheduled? 01/18/21  What type of clearance is required (medical clearance vs. Pharmacy clearance to hold med vs. Both)? both  Are there any medications that need to be held prior to surgery and how long? Not listed, please advise if needed  Practice name and name of physician performing surgery? [facility] - Dr Kurtis Bushman - [facility]  What is the office phone number? [phone]   7.   What is the office fax number? [phone]  8.   Anesthesia type (None, local, MAC, general) ? Local / spinal    Samuel Morris 12/06/2020, 3:10 PM  _________________________________________________________________   (provider comments below)

## 2020-12-08 NOTE — Telephone Encounter (Signed)
   Name: Samuel Morris  DOB: 02-14-1946  MRN: [id number]   Primary Cardiologist: None  Chart reviewed as part of pre-operative protocol coverage. Patient was contacted 12/08/2020 in reference to pre-operative risk assessment for pending surgery as outlined below.  Samuel Morris was last seen on 10/05/20 by Dr. Quentin Ore.  Since that day, Samuel Morris has done well. Easily getting > 4 METS of activity.   Hx of CAD, HTN, OSA, HLD, DM valvular heart disease and VT s/p ICD implant.  Dr. Quentin Ore, can patient hold ASA for 5-7 days if needed? Please forward your response to P CV DIV PREOP.   Thank you   Leanor Kail, PA 12/08/2020, 11:15 AM

## 2020-12-14 NOTE — Telephone Encounter (Signed)
   Name: Samuel Morris  DOB: [birth year]  MRN: [id number]   Primary Cardiologist: Dr. Quentin Ore (EP)  Chart reviewed as part of pre-operative protocol coverage. Patient was contacted 12/14/2020 in reference to pre-operative risk assessment for pending surgery as outlined below.  Samuel Morris was last seen on 09/2020 by Dr. Quentin Ore, doing well at that visit. Based on history, revised cardiac risk index is 6.6% indicating moderate CV risk. Vin Bhagat PA-C spoke with patient last week and he affirmed he was doing >4 METS. No formal clearance recommendation finalized at that time so I called patient today for update. The patient affirms he has been doing well without any new cardiac symptoms. He is very active in the garden harvesting, moving mulch, and walking up and down stairs without any angina or dyspnea. No recent ICD shocks. Therefore, based on ACC/AHA guidelines, the patient would be at acceptable risk for the planned procedure without further cardiovascular testing. The patient was advised that if he develops new symptoms prior to surgery to contact our office to arrange for a follow-up visit, and he verbalized understanding.  If surgical team feels it is necessary to hold aspirin for this procedure, Dr. Quentin Ore has given clearance to hold for 5-7 days.  Will also route to device team given ICD so they are aware of upcoming surgery.  I will route this recommendation to the requesting party via Epic fax function and remove from pre-op pool. Please call with questions.  Charlie Pitter, PA-C 12/14/2020, 4:21 PM

## 2020-12-15 ENCOUNTER — Ambulatory Visit: Payer: Medicare Other | Admitting: Podiatry

## 2020-12-15 ENCOUNTER — Encounter: Payer: Self-pay | Admitting: Cardiology

## 2020-12-15 NOTE — Progress Notes (Signed)
PERIOPERATIVE PRESCRIPTION FOR IMPLANTED CARDIAC DEVICE PROGRAMMING  Patient Information: Name:  Samuel Morris  DOB:  Mar 17, 1945  MRN:  [id number]    Request for surgical clearance:   What type of surgery is being performed? RT TKA traditional knee   When is this surgery scheduled? 01/18/21   What type of clearance is required (medical clearance vs. Pharmacy clearance to hold med vs. Both)? both   Are there any medications that need to be held prior to surgery and how long? Not listed, please advise if needed   Practice name and name of physician performing surgery? [facility] - Dr Kurtis Bushman - [facility]   What is the office phone number? [phone]   7.   What is the office fax number? [phone]   8.   Anesthesia type (None, local, MAC, general) ? Local / spinal      Device Information:  Clinic EP Physician:  Dr. Lars Mage   Device Type:  Defibrillator Manufacturer and Phone #:  Medtronic: [phone] Pacemaker Dependent?:  No. Date of Last Device Check:  10/14/20 Normal Device Function?:  Yes.    Electrophysiologist's Recommendations:  Have magnet available. Provide continuous ECG monitoring when magnet is used or reprogramming is to be performed.  Procedure should not interfere with device function.  No device programming or magnet placement needed.  Per Device Clinic Standing Orders, York Ram, RN  1:22 PM 12/15/2020

## 2020-12-15 NOTE — Progress Notes (Signed)
Sent Via Right Fax function

## 2020-12-22 ENCOUNTER — Other Ambulatory Visit: Payer: Self-pay

## 2020-12-22 ENCOUNTER — Ambulatory Visit (INDEPENDENT_AMBULATORY_CARE_PROVIDER_SITE_OTHER): Payer: Medicare Other | Admitting: Podiatry

## 2020-12-22 DIAGNOSIS — E1159 Type 2 diabetes mellitus with other circulatory complications: Secondary | ICD-10-CM | POA: Diagnosis not present

## 2020-12-22 DIAGNOSIS — B351 Tinea unguium: Secondary | ICD-10-CM | POA: Diagnosis not present

## 2020-12-22 DIAGNOSIS — M79675 Pain in left toe(s): Secondary | ICD-10-CM | POA: Diagnosis not present

## 2020-12-22 DIAGNOSIS — M79674 Pain in right toe(s): Secondary | ICD-10-CM

## 2020-12-26 ENCOUNTER — Encounter: Payer: Self-pay | Admitting: Podiatry

## 2020-12-26 NOTE — Progress Notes (Signed)
  Subjective:  Patient ID: Samuel Morris, male    DOB: 18-Nov-1945,  MRN: [id number]  Chief Complaint  Patient presents with   Diabetes    Nail trim    75 y.o. male returns for the above complaint.  Patient presents with thickened elongated dystrophic toenails x10.  Painful to palpation.  Patient would like to have them debrided down.  He is a diabetic with last A1c of 6.0.  He is not able to do it himself.  Objective:  There were no vitals filed for this visit. Podiatric Exam: Vascular: dorsalis pedis and posterior tibial pulses are palpable bilateral. Capillary return is immediate. Temperature gradient is WNL. Skin turgor WNL  Sensorium: Decreased Semmes Weinstein monofilament test.  Decreased tactile sensation bilaterally. Nail Exam: Pt has thick disfigured discolored nails with subungual debris noted bilateral entire nail hallux through fifth toenails.  Pain on palpation to the nails. Ulcer Exam: There is no evidence of ulcer or pre-ulcerative changes or infection. Orthopedic Exam: Muscle tone and strength are WNL. No limitations in general ROM. No crepitus or effusions noted. HAV  B/L.  Hammer toes 2-5  B/L. Skin: No Porokeratosis. No infection or ulcers    Assessment & Plan:   1. Type 2 diabetes mellitus with cardiac complication (HCC)   2. Pain due to onychomycosis of toenails of both feet      Patient was evaluated and treated and all questions answered.  Onychomycosis with pain  -Nails palliatively debrided as below. -Educated on self-care  Procedure: Nail Debridement Rationale: pain  Type of Debridement: manual, sharp debridement. Instrumentation: Nail nipper, rotary burr. Number of Nails: 10  Procedures and Treatment: Consent by patient was obtained for treatment procedures. The patient understood the discussion of treatment and procedures well. All questions were answered thoroughly reviewed. Debridement of mycotic and hypertrophic toenails, 1 through 5 bilateral and  clearing of subungual debris. No ulceration, no infection noted.  Return Visit-Office Procedure: Patient instructed to return to the office for a follow up visit 3 months for continued evaluation and treatment.  Boneta Lucks, DPM    No follow-ups on file.

## 2021-01-04 ENCOUNTER — Ambulatory Visit (INDEPENDENT_AMBULATORY_CARE_PROVIDER_SITE_OTHER): Payer: Medicare Other

## 2021-01-04 ENCOUNTER — Other Ambulatory Visit: Payer: Self-pay

## 2021-01-04 DIAGNOSIS — Z23 Encounter for immunization: Secondary | ICD-10-CM

## 2021-01-05 NOTE — Telephone Encounter (Signed)
I s/w Carmell Austria with [facility] [phone]. Carmell Austria stated even though [facility] may have sent the clearance to our office, the surgeon will be doing the surgery there at their hospital, as this will require the clearance to be faxed to them as well. Fax # [phone]. Carmell Austria also stated she re-faxed a form to the office today requesting recent echo results to be to them as well. I assured her that once I receive the request for the echo results, I will bring it to HIM Dept to fax over the results of recent echo to their office fax [phone] ATTN: KRISTINA.

## 2021-01-05 NOTE — Telephone Encounter (Signed)
I called Carmell Austria back and informed her that I have not seen the fax come over asking for recent echo results to be faxed to them for pre op clearance. I gave her the fax # for HIM dept [phone]. Carmell Austria thanked me for the help.

## 2021-01-05 NOTE — Telephone Encounter (Signed)
Please call with update on clearance

## 2021-01-10 ENCOUNTER — Encounter: Payer: Self-pay | Admitting: Internal Medicine

## 2021-01-10 ENCOUNTER — Emergency Department: Payer: Medicare Other

## 2021-01-10 ENCOUNTER — Other Ambulatory Visit: Payer: Self-pay

## 2021-01-10 ENCOUNTER — Inpatient Hospital Stay
Admission: EM | Admit: 2021-01-10 | Discharge: 2021-01-13 | DRG: 280 | Disposition: A | Discharge status: Home / Self Care | Payer: Medicare Other | Attending: Family Medicine | Admitting: Family Medicine

## 2021-01-10 ENCOUNTER — Encounter
Admission: EM | Disposition: A | Discharge status: Home / Self Care | Payer: Self-pay | Source: Home / Self Care | Attending: Family Medicine

## 2021-01-10 DIAGNOSIS — Z7982 Long term (current) use of aspirin: Secondary | ICD-10-CM | POA: Diagnosis not present

## 2021-01-10 DIAGNOSIS — I10 Essential (primary) hypertension: Secondary | ICD-10-CM | POA: Diagnosis not present

## 2021-01-10 DIAGNOSIS — Z8249 Family history of ischemic heart disease and other diseases of the circulatory system: Secondary | ICD-10-CM

## 2021-01-10 DIAGNOSIS — I214 Non-ST elevation (NSTEMI) myocardial infarction: Secondary | ICD-10-CM | POA: Diagnosis present

## 2021-01-10 DIAGNOSIS — I5022 Chronic systolic (congestive) heart failure: Secondary | ICD-10-CM

## 2021-01-10 DIAGNOSIS — I5023 Acute on chronic systolic (congestive) heart failure: Secondary | ICD-10-CM | POA: Diagnosis present

## 2021-01-10 DIAGNOSIS — Z791 Long term (current) use of non-steroidal anti-inflammatories (NSAID): Secondary | ICD-10-CM

## 2021-01-10 DIAGNOSIS — I5043 Acute on chronic combined systolic (congestive) and diastolic (congestive) heart failure: Secondary | ICD-10-CM | POA: Diagnosis not present

## 2021-01-10 DIAGNOSIS — I428 Other cardiomyopathies: Secondary | ICD-10-CM | POA: Diagnosis present

## 2021-01-10 DIAGNOSIS — Z95810 Presence of automatic (implantable) cardiac defibrillator: Secondary | ICD-10-CM | POA: Diagnosis not present

## 2021-01-10 DIAGNOSIS — I252 Old myocardial infarction: Secondary | ICD-10-CM | POA: Diagnosis not present

## 2021-01-10 DIAGNOSIS — Z953 Presence of xenogenic heart valve: Secondary | ICD-10-CM | POA: Diagnosis not present

## 2021-01-10 DIAGNOSIS — I25810 Atherosclerosis of coronary artery bypass graft(s) without angina pectoris: Secondary | ICD-10-CM | POA: Diagnosis not present

## 2021-01-10 DIAGNOSIS — I350 Nonrheumatic aortic (valve) stenosis: Secondary | ICD-10-CM | POA: Diagnosis present

## 2021-01-10 DIAGNOSIS — G4733 Obstructive sleep apnea (adult) (pediatric): Secondary | ICD-10-CM | POA: Diagnosis present

## 2021-01-10 DIAGNOSIS — E785 Hyperlipidemia, unspecified: Secondary | ICD-10-CM | POA: Diagnosis not present

## 2021-01-10 DIAGNOSIS — I2510 Atherosclerotic heart disease of native coronary artery without angina pectoris: Secondary | ICD-10-CM | POA: Diagnosis present

## 2021-01-10 DIAGNOSIS — I110 Hypertensive heart disease with heart failure: Secondary | ICD-10-CM | POA: Diagnosis present

## 2021-01-10 DIAGNOSIS — Z20822 Contact with and (suspected) exposure to covid-19: Secondary | ICD-10-CM | POA: Diagnosis present

## 2021-01-10 DIAGNOSIS — Z79899 Other long term (current) drug therapy: Secondary | ICD-10-CM

## 2021-01-10 DIAGNOSIS — I255 Ischemic cardiomyopathy: Secondary | ICD-10-CM | POA: Diagnosis present

## 2021-01-10 DIAGNOSIS — E119 Type 2 diabetes mellitus without complications: Secondary | ICD-10-CM | POA: Diagnosis present

## 2021-01-10 DIAGNOSIS — I493 Ventricular premature depolarization: Secondary | ICD-10-CM | POA: Diagnosis present

## 2021-01-10 DIAGNOSIS — I472 Ventricular tachycardia, unspecified: Secondary | ICD-10-CM | POA: Diagnosis not present

## 2021-01-10 DIAGNOSIS — Z951 Presence of aortocoronary bypass graft: Secondary | ICD-10-CM | POA: Diagnosis not present

## 2021-01-10 DIAGNOSIS — Z8261 Family history of arthritis: Secondary | ICD-10-CM | POA: Diagnosis not present

## 2021-01-10 HISTORY — PX: LEFT HEART CATH AND CORS/GRAFTS ANGIOGRAPHY: CATH118250

## 2021-01-10 LAB — CBC
HCT: 44.5 % (ref 39.0–52.0)
Hemoglobin: 14.5 g/dL (ref 13.0–17.0)
MCH: 29.5 pg (ref 26.0–34.0)
MCHC: 32.6 g/dL (ref 30.0–36.0)
MCV: 90.4 fL (ref 80.0–100.0)
Platelets: 249 10*3/uL (ref 150–400)
RBC: 4.92 MIL/uL (ref 4.22–5.81)
RDW: 14.2 % (ref 11.5–15.5)
WBC: 10.2 10*3/uL (ref 4.0–10.5)
nRBC: 0 % (ref 0.0–0.2)

## 2021-01-10 LAB — BASIC METABOLIC PANEL
Anion gap: 10 (ref 5–15)
BUN: 16 mg/dL (ref 8–23)
CO2: 25 mmol/L (ref 22–32)
Calcium: 9.3 mg/dL (ref 8.9–10.3)
Chloride: 102 mmol/L (ref 98–111)
Creatinine, Ser: 0.93 mg/dL (ref 0.61–1.24)
GFR, Estimated: >60 mL/min (ref >60)
Glucose, Bld: 190 mg/dL — ABNORMAL HIGH (ref 70–99)
Potassium: 4.1 mmol/L (ref 3.5–5.1)
Sodium: 137 mmol/L (ref 135–145)

## 2021-01-10 LAB — RESP PANEL BY RT-PCR (FLU A&B, COVID) ARPGX2
Influenza A by PCR: NEGATIVE
Influenza B by PCR: NEGATIVE
SARS Coronavirus 2 by RT PCR: NEGATIVE

## 2021-01-10 LAB — PROTIME-INR
INR: 1 (ref 0.8–1.2)
Prothrombin Time: 13.3 seconds (ref 11.4–15.2)

## 2021-01-10 LAB — TROPONIN I (HIGH SENSITIVITY)
Troponin I (High Sensitivity): 82 ng/L — ABNORMAL HIGH (ref <18)
Troponin I (High Sensitivity): 120 ng/L (ref <18)

## 2021-01-10 LAB — GLUCOSE, CAPILLARY
Glucose-Capillary: 151 mg/dL — ABNORMAL HIGH (ref 70–99)
Glucose-Capillary: 119 mg/dL — ABNORMAL HIGH (ref 70–99)

## 2021-01-10 LAB — APTT: aPTT: 27 seconds (ref 24–36)

## 2021-01-10 SURGERY — LEFT HEART CATH AND CORS/GRAFTS ANGIOGRAPHY
Anesthesia: Moderate Sedation

## 2021-01-10 MED ORDER — ASPIRIN EC 81 MG PO TBEC: 81.0000 mg | DELAYED_RELEASE_TABLET | Freq: Every day | ORAL | Status: DC

## 2021-01-10 MED ORDER — ALBUTEROL SULFATE (2.5 MG/3ML) 0.083% IN NEBU: 2.5000 mg | INHALATION_SOLUTION | RESPIRATORY_TRACT | Status: DC | PRN

## 2021-01-10 MED ORDER — SPIRONOLACTONE 25 MG PO TABS
25.0000 mg | ORAL_TABLET | Freq: Every day | ORAL | Status: DC
  Administered 2021-01-11 – 2021-01-13 (×3): 25 mg via ORAL
  Filled 2021-01-10 (×3): qty 1

## 2021-01-10 MED ORDER — HYDRALAZINE HCL 20 MG/ML IJ SOLN: 10.0000 mg | INTRAMUSCULAR | Status: AC | PRN

## 2021-01-10 MED ORDER — INSULIN ASPART 100 UNIT/ML IJ SOLN
0.0000 [IU] | Freq: Three times a day (TID) | INTRAMUSCULAR | Status: DC
  Administered 2021-01-10: 2 [IU] via SUBCUTANEOUS
  Administered 2021-01-11: 1 [IU] via SUBCUTANEOUS
  Administered 2021-01-11: 2 [IU] via SUBCUTANEOUS
  Administered 2021-01-12: 1 [IU] via SUBCUTANEOUS
  Administered 2021-01-12: 2 [IU] via SUBCUTANEOUS
  Administered 2021-01-13: 1 [IU] via SUBCUTANEOUS
  Filled 2021-01-10 (×6): qty 1

## 2021-01-10 MED ORDER — HEPARIN (PORCINE) 25000 UT/250ML-% IV SOLN
1100.0000 [IU]/h | INTRAVENOUS | Status: DC
  Administered 2021-01-10: 1100 [IU]/h via INTRAVENOUS
  Filled 2021-01-10: qty 250

## 2021-01-10 MED ORDER — HYDROXYCHLOROQUINE SULFATE 200 MG PO TABS
200.0000 mg | ORAL_TABLET | Freq: Two times a day (BID) | ORAL | Status: DC
  Administered 2021-01-10 – 2021-01-13 (×6): 200 mg via ORAL
  Filled 2021-01-10 (×8): qty 1

## 2021-01-10 MED ORDER — CLOPIDOGREL BISULFATE 75 MG PO TABS
75.0000 mg | ORAL_TABLET | Freq: Every day | ORAL | Status: DC
  Administered 2021-01-11 – 2021-01-13 (×3): 75 mg via ORAL
  Filled 2021-01-10 (×3): qty 1

## 2021-01-10 MED ORDER — HEPARIN (PORCINE) IN NACL 1000-0.9 UT/500ML-% IV SOLN
INTRAVENOUS | Status: AC
  Filled 2021-01-10: qty 1000

## 2021-01-10 MED ORDER — ONDANSETRON HCL 4 MG/2ML IJ SOLN: 4.0000 mg | Freq: Four times a day (QID) | INTRAMUSCULAR | Status: DC | PRN

## 2021-01-10 MED ORDER — ASPIRIN 81 MG PO CHEW
81.0000 mg | CHEWABLE_TABLET | ORAL | Status: AC
  Administered 2021-01-10: 81 mg via ORAL

## 2021-01-10 MED ORDER — MIDAZOLAM HCL 2 MG/2ML IJ SOLN
INTRAMUSCULAR | Status: AC
  Filled 2021-01-10: qty 2

## 2021-01-10 MED ORDER — IOHEXOL 350 MG/ML SOLN
INTRAVENOUS | Status: DC | PRN
  Administered 2021-01-10: 91 mL via INTRACARDIAC

## 2021-01-10 MED ORDER — FUROSEMIDE 10 MG/ML IJ SOLN
INTRAMUSCULAR | Status: AC
  Administered 2021-01-10: 40 mg via INTRAVENOUS
  Filled 2021-01-10: qty 4

## 2021-01-10 MED ORDER — MORPHINE SULFATE (PF) 2 MG/ML IV SOLN
2.0000 mg | Freq: Once | INTRAVENOUS | Status: AC
  Administered 2021-01-10: 2 mg via INTRAVENOUS
  Filled 2021-01-10: qty 1

## 2021-01-10 MED ORDER — ASPIRIN 81 MG PO CHEW
CHEWABLE_TABLET | ORAL | Status: AC
  Filled 2021-01-10: qty 1

## 2021-01-10 MED ORDER — ONDANSETRON HCL 4 MG/2ML IJ SOLN
4.0000 mg | Freq: Once | INTRAMUSCULAR | Status: AC
  Administered 2021-01-10: 4 mg via INTRAVENOUS
  Filled 2021-01-10: qty 2

## 2021-01-10 MED ORDER — SODIUM CHLORIDE 0.9% FLUSH
3.0000 mL | Freq: Two times a day (BID) | INTRAVENOUS | Status: DC
  Administered 2021-01-10 – 2021-01-13 (×6): 3 mL via INTRAVENOUS

## 2021-01-10 MED ORDER — CLOPIDOGREL BISULFATE 75 MG PO TABS
ORAL_TABLET | ORAL | Status: AC
  Administered 2021-01-10: 300 mg via ORAL
  Filled 2021-01-10: qty 4

## 2021-01-10 MED ORDER — ACETAMINOPHEN 325 MG PO TABS
650.0000 mg | ORAL_TABLET | ORAL | Status: DC | PRN
  Administered 2021-01-13: 650 mg via ORAL
  Filled 2021-01-10: qty 2

## 2021-01-10 MED ORDER — SODIUM CHLORIDE 0.9% FLUSH
3.0000 mL | INTRAVENOUS | Status: DC | PRN
  Administered 2021-01-13: 3 mL via INTRAVENOUS

## 2021-01-10 MED ORDER — SACUBITRIL-VALSARTAN 49-51 MG PO TABS
1.0000 | ORAL_TABLET | Freq: Two times a day (BID) | ORAL | Status: DC
  Administered 2021-01-10 – 2021-01-13 (×6): 1 via ORAL
  Filled 2021-01-10 (×8): qty 1

## 2021-01-10 MED ORDER — VITAMIN B-12 1000 MCG PO TABS
500.0000 ug | ORAL_TABLET | Freq: Every day | ORAL | Status: DC
  Administered 2021-01-11 – 2021-01-13 (×3): 500 ug via ORAL
  Filled 2021-01-10 (×4): qty 1

## 2021-01-10 MED ORDER — LABETALOL HCL 5 MG/ML IV SOLN: 10.0000 mg | INTRAVENOUS | Status: AC | PRN

## 2021-01-10 MED ORDER — ASPIRIN EC 81 MG PO TBEC
81.0000 mg | DELAYED_RELEASE_TABLET | Freq: Every day | ORAL | Status: DC
  Administered 2021-01-11 – 2021-01-13 (×3): 81 mg via ORAL
  Filled 2021-01-10 (×3): qty 1

## 2021-01-10 MED ORDER — SODIUM CHLORIDE 0.9% FLUSH: 3.0000 mL | Freq: Two times a day (BID) | INTRAVENOUS | Status: DC

## 2021-01-10 MED ORDER — ENOXAPARIN SODIUM 40 MG/0.4ML IJ SOSY
40.0000 mg | PREFILLED_SYRINGE | INTRAMUSCULAR | Status: DC
  Administered 2021-01-11 – 2021-01-12 (×2): 40 mg via SUBCUTANEOUS
  Filled 2021-01-10 (×2): qty 0.4

## 2021-01-10 MED ORDER — NITROGLYCERIN 0.4 MG SL SUBL: 0.4000 mg | SUBLINGUAL_TABLET | SUBLINGUAL | Status: DC | PRN

## 2021-01-10 MED ORDER — SODIUM CHLORIDE 0.9 % IV SOLN: 250.0000 mL | INTRAVENOUS | Status: DC | PRN

## 2021-01-10 MED ORDER — HEPARIN SODIUM (PORCINE) 1000 UNIT/ML IJ SOLN
INTRAMUSCULAR | Status: DC | PRN
  Administered 2021-01-10: 4500 [IU] via INTRAVENOUS

## 2021-01-10 MED ORDER — EZETIMIBE 10 MG PO TABS
10.0000 mg | ORAL_TABLET | Freq: Every day | ORAL | Status: DC
  Administered 2021-01-11 – 2021-01-13 (×3): 10 mg via ORAL
  Filled 2021-01-10 (×4): qty 1

## 2021-01-10 MED ORDER — LIDOCAINE HCL 1 % IJ SOLN
INTRAMUSCULAR | Status: AC
  Filled 2021-01-10: qty 20

## 2021-01-10 MED ORDER — ACETAMINOPHEN 325 MG PO TABS
650.0000 mg | ORAL_TABLET | ORAL | Status: DC | PRN
  Administered 2021-01-12 – 2021-01-13 (×2): 650 mg via ORAL
  Filled 2021-01-10 (×2): qty 2

## 2021-01-10 MED ORDER — HEPARIN SODIUM (PORCINE) 1000 UNIT/ML IJ SOLN
INTRAMUSCULAR | Status: AC
  Filled 2021-01-10: qty 1

## 2021-01-10 MED ORDER — SODIUM CHLORIDE 0.9 % IV SOLN: INTRAVENOUS | Status: DC

## 2021-01-10 MED ORDER — METOPROLOL SUCCINATE ER 50 MG PO TB24
150.0000 mg | ORAL_TABLET | Freq: Every day | ORAL | Status: DC
  Administered 2021-01-11 – 2021-01-13 (×3): 150 mg via ORAL
  Filled 2021-01-10 (×3): qty 1

## 2021-01-10 MED ORDER — FENTANYL CITRATE (PF) 100 MCG/2ML IJ SOLN
INTRAMUSCULAR | Status: DC | PRN
  Administered 2021-01-10: 25 ug via INTRAVENOUS

## 2021-01-10 MED ORDER — FUROSEMIDE 10 MG/ML IJ SOLN
40.0000 mg | Freq: Every day | INTRAMUSCULAR | Status: DC
  Administered 2021-01-11 – 2021-01-12 (×2): 40 mg via INTRAVENOUS
  Filled 2021-01-10 (×3): qty 4

## 2021-01-10 MED ORDER — ISOSORBIDE MONONITRATE ER 30 MG PO TB24
30.0000 mg | ORAL_TABLET | Freq: Every day | ORAL | Status: DC
  Administered 2021-01-10 – 2021-01-13 (×4): 30 mg via ORAL
  Filled 2021-01-10 (×4): qty 1

## 2021-01-10 MED ORDER — NITROGLYCERIN IN D5W 200-5 MCG/ML-% IV SOLN
0.0000 ug/min | INTRAVENOUS | Status: DC
Start: 2021-01-10 — End: 2021-01-10
  Administered 2021-01-10: 5 ug/min via INTRAVENOUS
  Filled 2021-01-10: qty 250

## 2021-01-10 MED ORDER — CLOPIDOGREL BISULFATE 300 MG PO TABS: 300.0000 mg | ORAL_TABLET | Freq: Once | ORAL | Status: AC

## 2021-01-10 MED ORDER — ALBUTEROL SULFATE HFA 108 (90 BASE) MCG/ACT IN AERS: 1.0000 | INHALATION_SPRAY | RESPIRATORY_TRACT | Status: DC | PRN

## 2021-01-10 MED ORDER — FENTANYL CITRATE (PF) 100 MCG/2ML IJ SOLN
INTRAMUSCULAR | Status: AC
  Filled 2021-01-10: qty 2

## 2021-01-10 MED ORDER — HEPARIN BOLUS VIA INFUSION
4000.0000 [IU] | Freq: Once | INTRAVENOUS | Status: AC
  Administered 2021-01-10: 4000 [IU] via INTRAVENOUS
  Filled 2021-01-10: qty 4000

## 2021-01-10 MED ORDER — ROSUVASTATIN CALCIUM 10 MG PO TABS
20.0000 mg | ORAL_TABLET | Freq: Every day | ORAL | Status: DC
  Administered 2021-01-10 – 2021-01-12 (×3): 20 mg via ORAL
  Filled 2021-01-10 (×3): qty 2
  Filled 2021-01-10: qty 1

## 2021-01-10 MED ORDER — MIDAZOLAM HCL 2 MG/2ML IJ SOLN
INTRAMUSCULAR | Status: DC | PRN
  Administered 2021-01-10: 1 mg via INTRAVENOUS

## 2021-01-10 MED ORDER — VITAMIN D 25 MCG (1000 UNIT) PO TABS
1000.0000 [IU] | ORAL_TABLET | Freq: Every day | ORAL | Status: DC
  Administered 2021-01-11 – 2021-01-13 (×3): 1000 [IU] via ORAL
  Filled 2021-01-10 (×3): qty 1

## 2021-01-10 MED ORDER — SODIUM CHLORIDE 0.9% FLUSH: 3.0000 mL | INTRAVENOUS | Status: DC | PRN

## 2021-01-10 MED ORDER — VERAPAMIL HCL 2.5 MG/ML IV SOLN
INTRAVENOUS | Status: DC | PRN
  Administered 2021-01-10: 2.5 mg via INTRA_ARTERIAL

## 2021-01-10 MED ORDER — VERAPAMIL HCL 2.5 MG/ML IV SOLN
INTRAVENOUS | Status: AC
  Filled 2021-01-10: qty 2

## 2021-01-10 MED ORDER — HEPARIN (PORCINE) IN NACL 1000-0.9 UT/500ML-% IV SOLN
INTRAVENOUS | Status: DC | PRN
  Administered 2021-01-10 (×2): 500 mL

## 2021-01-10 SURGICAL SUPPLY — 11 items
CATH INFINITI 5 FR IM (CATHETERS) ×2 IMPLANT
CATH INFINITI 5 FR MPA2 (CATHETERS) ×2 IMPLANT
CATH INFINITI 5FR JL4 (CATHETERS) ×2 IMPLANT
DEVICE RAD TR BAND REGULAR (VASCULAR PRODUCTS) ×2 IMPLANT
DRAPE BRACHIAL (DRAPES) ×2 IMPLANT
GLIDESHEATH SLEND SS 6F .021 (SHEATH) ×2 IMPLANT
PACK CARDIAC CATH (CUSTOM PROCEDURE TRAY) ×2 IMPLANT
PROTECTION STATION PRESSURIZED (MISCELLANEOUS) ×2
SET ATX SIMPLICITY (MISCELLANEOUS) ×2 IMPLANT
STATION PROTECTION PRESSURIZED (MISCELLANEOUS) ×1 IMPLANT
WIRE HI TORQ VERSACORE J 260CM (WIRE) ×2 IMPLANT

## 2021-01-10 NOTE — H&P (Signed)
History and Physical:    Samuel Morris   IDP:[id number] DOB: October 02, 1945 DOA: 01/10/2021  Referring MD/provider: Lavonia Drafts, MD PCP: Valerie Roys, DO   Patient coming from: Home  Chief Complaint: Chest pain  History of Present Illness:   Samuel Morris is a 75 y.o. male with medical history significant for type 2 diabetes mellitus, CAD with previous MI s/p CABG, hyperlipidemia, hypertension, aortic stenosis s/p bovine aortic valve, OSA, who presented to the hospital with chest pain.  Chest pain was severe (graded as 9/10 in severity).  It started last night but it went away and came back this morning while he was getting his grandson ready for school.  It radiated to his left arm.  There was no known relieving or aggravating factors.  By the time I saw him, he said his pain had gone down to 1/10.  Of note, this was after IV nitroglycerin had been started in the ED.  No shortness of breath, dizziness, palpitations, cough,   ED Course: BP was elevated in the ED at 157/102 but other vital signs were normal.  Troponin was elevated at 82.  He was started on IV heparin and IV nitroglycerin infusion.  ROS:   ROS all other systems reviewed were negative  Past Medical History:   Past Medical History:  Diagnosis Date   Cancer ([place])    Scalp   Cataract    Diabetes mellitus without complication ([place])    Heart attack ([place])    Heart disease    Hyperlipidemia    Hypertension    Sleep apnea    Stenosis of aorta     Past Surgical History:   Past Surgical History:  Procedure Laterality Date   CARDIAC DEFIBRILLATOR PLACEMENT     CARDIAC SURGERY      Social History:   Social History   Socioeconomic History   Marital status: Married    Spouse name: Not on file   Number of children: Not on file   Years of education: Not on file   Highest education level: Not on file  Occupational History   Occupation: retired  Tobacco Use   Smoking status: Never   Smokeless tobacco: Never   Vaping Use   Vaping Use: Never used  Substance and Sexual Activity   Alcohol use: Yes    Comment: On occasion   Drug use: Never   Sexual activity: Yes    Birth control/protection: None  Other Topics Concern   Not on file  Social History Narrative   Not on file   Social Determinants of Health   Financial Resource Strain: Low Risk    Difficulty of Paying Living Expenses: Not hard at all  Food Insecurity: No Food Insecurity   Worried About Charity fundraiser in the Last Year: Never true   [place] in the Last Year: Never true  Transportation Needs: No Transportation Needs   Lack of Transportation (Medical): No   Lack of Transportation (Non-Medical): No  Physical Activity: Inactive   Days of Exercise per Week: 0 days   Minutes of Exercise per Session: 0 min  Stress: No Stress Concern Present   Feeling of Stress : Not at all  Social Connections: Not on file  Intimate Partner Violence: Not on file    Allergies   Patient has no known allergies.  Family history:   Family History  Problem Relation Age of Onset   Heart disease Mother    Heart attack Father  Arthritis Sister    Hypertension Sister    Hypertension Brother    Heart disease Paternal Grandmother    Heart murmur Sister     Current Medications:   Prior to Admission medications   Medication Sig Start Date End Date Taking? Authorizing Provider  Ascorbic Acid (VITAMIN C PO) Take 100 mg by mouth daily.   Yes [provider]  aspirin EC 81 MG tablet Take 81 mg by mouth daily. 01/20/18  Yes [provider]  Calcium-Magnesium 100-50 MG TABS Take 1 tablet by mouth daily.   Yes [provider]  Cholecalciferol (VITAMIN D-1000 MAX ST) 25 MCG (1000 UT) tablet Take 1,000 Units by mouth daily.   Yes [provider]  ENTRESTO 49-51 MG Take 1 tablet by mouth 2 (two) times daily. 10/24/18  Yes [provider]  ezetimibe (ZETIA) 10 MG tablet Take 1 tablet (10 mg total)  by mouth daily. 10/11/20  Yes Johnson, Megan P, DO  hydroxychloroquine (PLAQUENIL) 200 MG tablet Take 200 mg by mouth 2 (two) times daily. 10/24/20  Yes [provider]  metoprolol succinate (TOPROL-XL) 100 MG 24 hr tablet Take 1.5 tabs daily. Take with or immediately following a meal. 10/11/20  Yes Johnson, Megan P, DO  rosuvastatin (CRESTOR) 20 MG tablet Take 1 tablet (20 mg total) by mouth at bedtime. 10/11/20  Yes Johnson, Megan P, DO  spironolactone (ALDACTONE) 25 MG tablet Take 1 tablet (25 mg total) by mouth daily. 10/11/20  Yes Johnson, Megan P, DO  vitamin B-12 (CYANOCOBALAMIN) 1000 MCG tablet Take by mouth.   Yes [provider]  albuterol (VENTOLIN HFA) 108 (90 Base) MCG/ACT inhaler Inhale 1 puff into the lungs every 4 (four) hours as needed for wheezing or shortness of breath. 04/06/20   Rumball, Jake Church, DO  benzonatate (TESSALON) 100 MG capsule Take 1 capsule (100 mg total) by mouth 2 (two) times daily as needed for cough. Patient not taking: No sig reported 07/08/20   McElwee, Lauren A, NP  dapagliflozin propanediol (FARXIGA) 10 MG TABS tablet Take by mouth daily. Patient not taking: Reported on 01/10/2021    [provider]  diclofenac Sodium (VOLTAREN) 1 % GEL SMARTSIG:4 Gram(s) Topical Twice Daily 09/30/20   [provider]  GLUCOSAMINE-CHONDROITIN PO Take 1 tablet by mouth daily. Patient not taking: Reported on 01/10/2021    [provider]  meloxicam (MOBIC) 15 MG tablet Take 15 mg by mouth daily. Patient not taking: No sig reported 04/04/20   [provider]  nitroGLYCERIN (NITROSTAT) 0.4 MG SL tablet Place 1 tablet (0.4 mg total) under the tongue every 5 (five) minutes as needed for chest pain. 09/10/19   Johnson, Megan P, DO  PAXLOVID, 300/100, 20 x 150 MG & 10 x 100MG  TBPK Take by mouth. Patient not taking: Reported on 01/10/2021 11/15/20   [provider]  predniSONE (DELTASONE) 5 MG tablet Take by mouth. Patient not taking:  Reported on 01/10/2021 10/24/20   [provider]    Physical Exam:   Vitals:   01/10/21 1030 01/10/21 1045 01/10/21 1100 01/10/21 1115  BP: 121/74 121/70 127/72 131/75  Pulse: (!) 57 68 70 70  Resp: 18 20 (!) 21 (!) 25  Temp:      TempSrc:      SpO2: 99% 95% 97% 97%  Weight:      Height:         Physical Exam: Blood pressure 131/75, pulse 70, temperature 97.8 F (36.6 C), temperature source Oral,  resp. rate (!) 25, height 5\' 7"  (1.702 m), weight 86.2 kg, SpO2 97 %. Gen: No acute distress. Head: Normocephalic, atraumatic. Eyes: Pupils equal, round and reactive to light. Extraocular movements intact.  Sclerae nonicteric.  Mouth: Moist mucous membranes Neck: Supple, no thyromegaly, no lymphadenopathy, no jugular venous distention. Chest: Lungs are clear to auscultation with good air movement. No rales, rhonchi or wheezes.  CV: Heart sounds are regular with an S1, S2. No murmurs, rubs or gallops.  Abdomen: Soft, nontender, nondistended with normal active bowel sounds. No palpable masses. Extremities: Extremities are without clubbing, or cyanosis. No edema. Pedal pulses 2+.  Skin: Warm and dry. No rashes, lesions or wounds Neuro: Alert and oriented times 3; grossly nonfocal.  Psych: Insight is good and judgment is appropriate. Mood and affect normal.   Data Review:    Labs: Basic Metabolic Panel: Recent Labs  Lab 01/10/21 0841  NA 137  K 4.1  CL 102  CO2 25  GLUCOSE 190*  BUN 16  CREATININE 0.93  CALCIUM 9.3   Liver Function Tests: No results for input(s): AST, ALT, ALKPHOS, BILITOT, PROT, ALBUMIN in the last 168 hours. No results for input(s): LIPASE, AMYLASE in the last 168 hours. No results for input(s): AMMONIA in the last 168 hours. CBC: Recent Labs  Lab 01/10/21 0841  WBC 10.2  HGB 14.5  HCT 44.5  MCV 90.4  PLT 249   Cardiac Enzymes: No results for input(s): CKTOTAL, CKMB, CKMBINDEX, TROPONINI in the last 168 hours.  BNP (last 3  results) No results for input(s): PROBNP in the last 8760 hours. CBG: No results for input(s): GLUCAP in the last 168 hours.  Urinalysis    Component Value Date/Time   APPEARANCEUR Clear 09/10/2019 0845   GLUCOSEU 3+ (A) 09/10/2019 0845   BILIRUBINUR Negative 09/10/2019 0845   PROTEINUR Negative 09/10/2019 0845   NITRITE Negative 09/10/2019 0845   LEUKOCYTESUR Negative 09/10/2019 0845      Radiographic Studies: DG Chest 2 View  Result Date: 01/10/2021 CLINICAL DATA:  Chest pain EXAM: CHEST - 2 VIEW COMPARISON:  2018 FINDINGS: Chronic interstitial changes. No new consolidation or edema. No pleural effusion. Stable mild cardiomegaly. Valve replacement. Left chest wall ICD. No acute osseous abnormality. IMPRESSION: No acute process in the chest. Chronic interstitial changes and cardiomegaly. Electronically Signed   By: Macy Mis M.D.   On: 01/10/2021 09:00    EKG: Independently reviewed by me.  Normal sinus rhythm and T wave inversions in inferior leads   Assessment/Plan:   Active Problems:   NSTEMI (non-ST elevated myocardial infarction) (HCC)   Body mass index is 29.76 kg/m.   Chest pain and elevated troponins, probable acute NSTEMI: Admit to progressive cardiac unit.  Troponin went from 82-120.  Continue IV heparin infusion and monitor heparin level per protocol.  Continue IV nitroglycerin drip and taper off as able.  Continue home aspirin, rosuvastatin and Toprol-XL.  Dr. Saunders Revel, cardiologist, has been consulted to assist with management.  Chronic systolic CHF/ischemic cardiomyopathy s/p AICD, s/p bovine aortic valve replacement: Continue Entresto, Toprol and Aldactone  Hypertension: Continue antihypertensives  Type II DM: NovoLog as needed for hyperglycemia  Hyperlipidemia: He is on rosuvastatin   Other information:   DVT prophylaxis:   On IV heparin  Code Status: Full code. Family Communication: Wife at the bedside Disposition Plan: Home in 2 to 3  days Consults called: Cardiologist Admission status: Inpatient  The medical decision making on this patient was of high complexity and the patient  is at high risk for clinical deterioration, therefore this is a level 3 visit.    Trasean Delima [facility] Pager: Please check [email or web address]   How to contact the [facility] Attending or Consulting provider [place] or covering provider during after hours [place], for this patient?   Check the care team in [facility] and look for a) attending/consulting [facility] provider listed and b) the [facility] team listed Log into [email or web address] and use 's universal password to access. If you do not have the password, please contact the hospital operator. Locate the [facility] provider you are looking for under [facility] and page to a number that you can be directly reached. If you still have difficulty reaching the provider, please page the [facility] (Director on Call) for the Hospitalists listed on amion for assistance.  01/10/2021, 11:38 AM

## 2021-01-10 NOTE — H&P (View-Only) (Signed)
Cardiology Consultation:   Patient ID: Samuel Morris MRN: [id number]; DOB: 03-Sep-1945  Admit date: 01/10/2021 Date of Consult: 01/10/2021  PCP:  Valerie Roys, DO   [facility] Providers Cardiologist:  None  Electrophysiologist:  Vickie Epley, MD       Patient Profile:   Samuel Morris is a 75 y.o. male with a hx of coronary artery disease with remote MI and subsequent CABG (2018 at [facility]; LIMA-LAD and SVG-PDA), chronic HFrEF due to ischemic cardiomyopathy complicated by ventricular tachycardia status post ICD, aortic stenosis status post bioprosthetic AVR (2018 at [facility]; 25 mm [facility]) hypertension, hyperlipidemia, type 2 diabetes mellitus, and obstructive sleep apnea, who is being seen 01/10/2021 for the evaluation of chest pain and elevated troponin at the request of Dr. Corky Downs.  History of Present Illness:   Samuel Morris was in his usual state of health until this morning when he had sudden onset of subsequent sternal chest tightness/pressure radiating to the left arm.  He was in the process of helping get his grandson ready for school.  He tried taking sublingual nitroglycerin at home without relief, prompting his wife to call EMS.  On arrival to [facility], he continued to have some chest discomfort which has since resolved with IV morphine and nitroglycerin infusion.  Samuel Morris denies palpitations, lightheadedness, shortness of breath, and edema.  He did not experience any ICD shocks today.  He has never felt this kind of chest pain before, including at the time of his MI many years ago or leading up to his CABG/AVR in 2018.  He has been compliant with his medications.  In the emergency department waiting area, there was concern for possible nonsustained ventricular tachycardia.  Telemetry since arriving in his room has shown sinus rhythm with PVCs.   Past Medical History:  Diagnosis Date   Cancer ([place])    Scalp   Cataract    Diabetes mellitus without complication ([place])     Heart attack ([place])    Heart disease    Hyperlipidemia    Hypertension    Sleep apnea    Stenosis of aorta     Past Surgical History:  Procedure Laterality Date   CARDIAC DEFIBRILLATOR PLACEMENT     CARDIAC SURGERY       Home Medications:  Prior to Admission medications   Medication Sig Start Date Sneha Willig Date Taking? Authorizing Provider  Ascorbic Acid (VITAMIN C PO) Take 100 mg by mouth daily.   Yes [provider]  aspirin EC 81 MG tablet Take 81 mg by mouth daily. 01/20/18  Yes [provider]  Calcium-Magnesium 100-50 MG TABS Take 1 tablet by mouth daily.   Yes [provider]  Cholecalciferol (VITAMIN D-1000 MAX ST) 25 MCG (1000 UT) tablet Take 1,000 Units by mouth daily.   Yes [provider]  ENTRESTO 49-51 MG Take 1 tablet by mouth 2 (two) times daily. 10/24/18  Yes [provider]  ezetimibe (ZETIA) 10 MG tablet Take 1 tablet (10 mg total) by mouth daily. 10/11/20  Yes Johnson, Megan P, DO  hydroxychloroquine (PLAQUENIL) 200 MG tablet Take 200 mg by mouth 2 (two) times daily. 10/24/20  Yes [provider]  metoprolol succinate (TOPROL-XL) 100 MG 24 hr tablet Take 1.5 tabs daily. Take with or immediately following a meal. 10/11/20  Yes Johnson, Megan P, DO  rosuvastatin (CRESTOR) 20 MG tablet Take 1 tablet (20 mg total) by mouth at bedtime. 10/11/20  Yes Johnson, Megan P, DO  spironolactone (ALDACTONE)  25 MG tablet Take 1 tablet (25 mg total) by mouth daily. 10/11/20  Yes Johnson, Megan P, DO  vitamin B-12 (CYANOCOBALAMIN) 1000 MCG tablet Take by mouth.   Yes [provider]  albuterol (VENTOLIN HFA) 108 (90 Base) MCG/ACT inhaler Inhale 1 puff into the lungs every 4 (four) hours as needed for wheezing or shortness of breath. 04/06/20   Rumball, Jake Church, DO  benzonatate (TESSALON) 100 MG capsule Take 1 capsule (100 mg total) by mouth 2 (two) times daily as needed for cough. Patient not taking: No sig reported 07/08/20   McElwee,  Lauren A, NP  dapagliflozin propanediol (FARXIGA) 10 MG TABS tablet Take by mouth daily. Patient not taking: Reported on 01/10/2021    [provider]  diclofenac Sodium (VOLTAREN) 1 % GEL SMARTSIG:4 Gram(s) Topical Twice Daily 09/30/20   [provider]  GLUCOSAMINE-CHONDROITIN PO Take 1 tablet by mouth daily. Patient not taking: Reported on 01/10/2021    [provider]  meloxicam (MOBIC) 15 MG tablet Take 15 mg by mouth daily. Patient not taking: No sig reported 04/04/20   [provider]  nitroGLYCERIN (NITROSTAT) 0.4 MG SL tablet Place 1 tablet (0.4 mg total) under the tongue every 5 (five) minutes as needed for chest pain. 09/10/19   Johnson, Megan P, DO  PAXLOVID, 300/100, 20 x 150 MG & 10 x 100MG  TBPK Take by mouth. Patient not taking: Reported on 01/10/2021 11/15/20   [provider]  predniSONE (DELTASONE) 5 MG tablet Take by mouth. Patient not taking: Reported on 01/10/2021 10/24/20   [provider]    Inpatient Medications: Scheduled Meds:  aspirin EC  81 mg Oral Daily   cholecalciferol  1,000 Units Oral Daily   ezetimibe  10 mg Oral Daily   hydroxychloroquine  200 mg Oral BID   metoprolol succinate  150 mg Oral Daily   rosuvastatin  20 mg Oral QHS   sacubitril-valsartan  1 tablet Oral BID   spironolactone  25 mg Oral Daily   vitamin B-12  500 mcg Oral Daily   Continuous Infusions:  heparin 1,100 Units/hr (01/10/21 1119)   nitroGLYCERIN 15 mcg/min (01/10/21 1000)   PRN Meds: albuterol  Allergies:   No Known Allergies  Social History:   Social History   Tobacco Use   Smoking status: Never   Smokeless tobacco: Never  Vaping Use   Vaping Use: Never used  Substance Use Topics   Alcohol use: Yes    Comment: On occasion   Drug use: Never     Family History:   Family History  Problem Relation Age of Onset   Heart disease Mother    Heart attack Father    Arthritis Sister    Hypertension Sister    Hypertension  Brother    Heart disease Paternal Grandmother    Heart murmur Sister      ROS:  Please see the history of present illness.  All other ROS reviewed and negative.     Physical Exam/Data:   Vitals:   01/10/21 1030 01/10/21 1045 01/10/21 1100 01/10/21 1115  BP: 121/74 121/70 127/72 131/75  Pulse: (!) 57 68 70 70  Resp: 18 20 (!) 21 (!) 25  Temp:      TempSrc:      SpO2: 99% 95% 97% 97%  Weight:      Height:       No intake or output data in the 24 hours ending 01/10/21 1212 Last 3 Weights 01/10/2021 10/11/2020 10/05/2020  Weight (lbs) 190 lb 191 lb 12.8 oz 191 lb  Weight (kg) 86.183 kg 87 kg 86.637 kg     Body mass index is 29.76 kg/m.  General:  Well nourished, well developed, in no acute distress.  His wife is at the bedside. HEENT: normal Neck: no JVD Vascular: No carotid bruits; 2+ radial pulses bilaterally. Cardiac: Regular rate and rhythm with occasional extrasystoles and 1/6 systolic murmur.  No rubs or gallops. Lungs:  clear to auscultation bilaterally, no wheezing, rhonchi or rales  Abd: soft, nontender, no hepatomegaly  Ext: no edema Musculoskeletal:  No deformities, BUE and BLE strength normal and equal Skin: warm and dry  Neuro:  CNs 2-12 intact, no focal abnormalities noted Psych:  Normal affect   EKG:  The EKG was personally reviewed and demonstrates: Normal sinus rhythm with inferior T wave inversions and septal infarct.  Compared with prior tracing from 10/05/2020, inferior T wave inversions are new. Telemetry:  Telemetry was personally reviewed and demonstrates: Normal sinus rhythm with PVCs.  Relevant CV Studies: TTE (11/28/2020): Normal LV size and wall thickness.  LVEF 30-35% with akinesis of the mid/apical anterior/anteroseptal segments as well as the apex.  Normal RV size and wall thickness.  Mild-moderate pulmonary hypertension.  Mild left atrial enlargement.  Bioprosthetic aortic valve present with mean gradient 10 mmHg.  CVP 8 mmHg.  Laboratory  Data:  High Sensitivity Troponin:   Recent Labs  Lab 01/10/21 0841 01/10/21 0959  TROPONINIHS 82* 120*     Chemistry Recent Labs  Lab 01/10/21 0841  NA 137  K 4.1  CL 102  CO2 25  GLUCOSE 190*  BUN 16  CREATININE 0.93  CALCIUM 9.3  GFRNONAA >60  ANIONGAP 10    No results for input(s): PROT, ALBUMIN, AST, ALT, ALKPHOS, BILITOT in the last 168 hours. Lipids No results for input(s): CHOL, TRIG, HDL, LABVLDL, LDLCALC, CHOLHDL in the last 168 hours.  Hematology Recent Labs  Lab 01/10/21 0841  WBC 10.2  RBC 4.92  HGB 14.5  HCT 44.5  MCV 90.4  MCH 29.5  MCHC 32.6  RDW 14.2  PLT 249   Thyroid No results for input(s): TSH, FREET4 in the last 168 hours.  BNPNo results for input(s): BNP, PROBNP in the last 168 hours.  DDimer No results for input(s): DDIMER in the last 168 hours.   Radiology/Studies:  DG Chest 2 View  Result Date: 01/10/2021 CLINICAL DATA:  Chest pain EXAM: CHEST - 2 VIEW COMPARISON:  2018 FINDINGS: Chronic interstitial changes. No new consolidation or edema. No pleural effusion. Stable mild cardiomegaly. Valve replacement. Left chest wall ICD. No acute osseous abnormality. IMPRESSION: No acute process in the chest. Chronic interstitial changes and cardiomegaly. Electronically Signed   By: Macy Mis M.D.   On: 01/10/2021 09:00     Assessment and Plan:   NSTEMI: Samuel Morris presents with cute onset of chest pain this morning while helping his grandson get ready for school.  It has since abated with morphine and IV nitroglycerin.  EKG shows new inferior T wave inversions.  Troponin is also mildly elevated and uptrending.  We have discussed invasive and conservative strategies and have agreed to proceed with cardiac catheterization and possible PCI today. -Plan for left heart catheterization with possible PCI today. -In the meantime, continue aspirin and heparin infusion. -Titrate IV nitroglycerin for relief of chest pain. -Continue secondary prevention  with rosuvastatin and ezetimibe.  Chronic HFrEF due to ischemic cardiomyopathy: Samuel Morris appears euvolemic on exam without  symptoms of acute decompensated heart failure. -Maintain net even fluid balance. -Continue home regimen of metoprolol succinate, Entresto, spironolactone, and dapagliflozin.  Hypertension: -Titrate nitroglycerin infusion. -Resume home medications.  Hyperlipidemia: Lipids at goal on last check in 10/2020. -Continue rosuvastatin and ezetimibe.  Type 2 diabetes mellitus: -Per internal medicine.   Risk Assessment/Risk Scores:     TIMI Risk Score for Unstable Angina or Non-ST Elevation MI:   The patient's TIMI risk score is 5, which indicates a 26% risk of all cause mortality, new or recurrent myocardial infarction or need for urgent revascularization in the next 14 days.  New York Heart Association (NYHA) Functional Class NYHA Class II     For questions or updates, please contact [facility] Please consult [email or web address] for contact info under    Signed, Nelva Bush, MD  01/10/2021 12:12 PM

## 2021-01-10 NOTE — Consult Note (Signed)
ANTICOAGULATION CONSULT NOTE - Initial Consult  Pharmacy Consult for Heparin Infusion Indication: chest pain/ACS  No Known Allergies  Patient Measurements: Height: 5\' 7"  (170.2 cm) Weight: 86.2 kg (190 lb) IBW/kg (Calculated) : 66.1 Heparin Dosing Weight: 83.7 kg  Vital Signs: Temp: 97.8 F (36.6 C) (11/01 0831) Temp Source: Oral (11/01 0831) BP: 131/75 (11/01 1115) Pulse Rate: 70 (11/01 1115)  Labs: Recent Labs    01/10/21 0841 01/10/21 0959  HGB 14.5  --   HCT 44.5  --   PLT 249  --   APTT  --  27  LABPROT  --  13.3  INR  --  1.0  CREATININE 0.93  --   TROPONINIHS 82* 120*    Estimated Creatinine Clearance: 70.8 mL/min (by C-G formula based on SCr of 0.93 mg/dL).   Medical History: Past Medical History:  Diagnosis Date   Cancer ([place])    Scalp   Cataract    Diabetes mellitus without complication ([place])    Heart attack ([place])    Heart disease    Hyperlipidemia    Hypertension    Sleep apnea    Stenosis of aorta     Medications:  No PTA anticoagulant use chronically  Assessment: Pharmacy has been consulted to initiate and monitor heparin infusion in 75yo patient with a history of CAD with previous MI s/p CABG admitted with chest pain. Troponin levels of 82>120. No history of chronic anticoagulant use PTA.  Basline labs: aPTT 27 sec, INR 1.0, Hgb 14.5, Plts 245  Goal of Therapy:  Heparin level 0.3-0.7 units/ml Monitor platelets by anticoagulation protocol: Yes   Plan:  Give 4000 units bolus x 1 Start heparin infusion at 1100 units/hr Check anti-Xa level in 8 hours and daily while on heparin Continue to monitor H&H and platelets  Jessah Danser A Yusif Gnau 01/10/2021,11:58 AM

## 2021-01-10 NOTE — Interval H&P Note (Signed)
History and Physical Interval Note:  01/10/2021 1:15 PM  Samuel Morris  has presented today for surgery, with the diagnosis of NSTEMI.  The various methods of treatment have been discussed with the patient and family. After consideration of risks, benefits and other options for treatment, the patient has consented to  Procedure(s): LEFT HEART CATH AND CORS/GRAFTS ANGIOGRAPHY (N/A) as a surgical intervention.  The patient's history has been reviewed, patient examined, no change in status, stable for surgery.  I have reviewed the patient's chart and labs.  Questions were answered to the patient's satisfaction.    Cath Lab Visit (complete for each Cath Lab visit)  Clinical Evaluation Leading to the Procedure:   ACS: Yes.    Non-ACS:  N/A  Gwenetta Devos

## 2021-01-10 NOTE — Consult Note (Signed)
Cardiology Consultation:   Patient ID: Stacey Maura MRN: [id number]; DOB: 1945/06/29  Admit date: 01/10/2021 Date of Consult: 01/10/2021  PCP:  Valerie Roys, DO   [facility] Providers Cardiologist:  None  Electrophysiologist:  Vickie Epley, MD       Patient Profile:   Alberto Pina is a 75 y.o. male with a hx of coronary artery disease with remote MI and subsequent CABG (2018 at [facility]; LIMA-LAD and SVG-PDA), chronic HFrEF due to ischemic cardiomyopathy complicated by ventricular tachycardia status post ICD, aortic stenosis status post bioprosthetic AVR (2018 at [facility]; 25 mm [place]) hypertension, hyperlipidemia, type 2 diabetes mellitus, and obstructive sleep apnea, who is being seen 01/10/2021 for the evaluation of chest pain and elevated troponin at the request of Dr. Corky Downs.  History of Present Illness:   Mr. Trost was in his usual state of health until this morning when he had sudden onset of subsequent sternal chest tightness/pressure radiating to the left arm.  He was in the process of helping get his grandson ready for school.  He tried taking sublingual nitroglycerin at home without relief, prompting his wife to call EMS.  On arrival to [facility], he continued to have some chest discomfort which has since resolved with IV morphine and nitroglycerin infusion.  Mr. Stainback denies palpitations, lightheadedness, shortness of breath, and edema.  He did not experience any ICD shocks today.  He has never felt this kind of chest pain before, including at the time of his MI many years ago or leading up to his CABG/AVR in 2018.  He has been compliant with his medications.  In the emergency department waiting area, there was concern for possible nonsustained ventricular tachycardia.  Telemetry since arriving in his room has shown sinus rhythm with PVCs.   Past Medical History:  Diagnosis Date   Cancer ([place])    Scalp   Cataract    Diabetes mellitus without complication ([place])     Heart attack ([place])    Heart disease    Hyperlipidemia    Hypertension    Sleep apnea    Stenosis of aorta     Past Surgical History:  Procedure Laterality Date   CARDIAC DEFIBRILLATOR PLACEMENT     CARDIAC SURGERY       Home Medications:  Prior to Admission medications   Medication Sig Start Date Jorden Minchey Date Taking? Authorizing Provider  Ascorbic Acid (VITAMIN C PO) Take 100 mg by mouth daily.   Yes [provider]  aspirin EC 81 MG tablet Take 81 mg by mouth daily. 01/20/18  Yes [provider]  Calcium-Magnesium 100-50 MG TABS Take 1 tablet by mouth daily.   Yes [provider]  Cholecalciferol (VITAMIN D-1000 MAX ST) 25 MCG (1000 UT) tablet Take 1,000 Units by mouth daily.   Yes [provider]  ENTRESTO 49-51 MG Take 1 tablet by mouth 2 (two) times daily. 10/24/18  Yes [provider]  ezetimibe (ZETIA) 10 MG tablet Take 1 tablet (10 mg total) by mouth daily. 10/11/20  Yes Johnson, Megan P, DO  hydroxychloroquine (PLAQUENIL) 200 MG tablet Take 200 mg by mouth 2 (two) times daily. 10/24/20  Yes [provider]  metoprolol succinate (TOPROL-XL) 100 MG 24 hr tablet Take 1.5 tabs daily. Take with or immediately following a meal. 10/11/20  Yes Johnson, Megan P, DO  rosuvastatin (CRESTOR) 20 MG tablet Take 1 tablet (20 mg total) by mouth at bedtime. 10/11/20  Yes Johnson, Megan P, DO  spironolactone (ALDACTONE)  25 MG tablet Take 1 tablet (25 mg total) by mouth daily. 10/11/20  Yes Johnson, Megan P, DO  vitamin B-12 (CYANOCOBALAMIN) 1000 MCG tablet Take by mouth.   Yes [provider]  albuterol (VENTOLIN HFA) 108 (90 Base) MCG/ACT inhaler Inhale 1 puff into the lungs every 4 (four) hours as needed for wheezing or shortness of breath. 04/06/20   Rumball, Jake Church, DO  benzonatate (TESSALON) 100 MG capsule Take 1 capsule (100 mg total) by mouth 2 (two) times daily as needed for cough. Patient not taking: No sig reported 07/08/20   McElwee,  Lauren A, NP  dapagliflozin propanediol (FARXIGA) 10 MG TABS tablet Take by mouth daily. Patient not taking: Reported on 01/10/2021    [provider]  diclofenac Sodium (VOLTAREN) 1 % GEL SMARTSIG:4 Gram(s) Topical Twice Daily 09/30/20   [provider]  GLUCOSAMINE-CHONDROITIN PO Take 1 tablet by mouth daily. Patient not taking: Reported on 01/10/2021    [provider]  meloxicam (MOBIC) 15 MG tablet Take 15 mg by mouth daily. Patient not taking: No sig reported 04/04/20   [provider]  nitroGLYCERIN (NITROSTAT) 0.4 MG SL tablet Place 1 tablet (0.4 mg total) under the tongue every 5 (five) minutes as needed for chest pain. 09/10/19   Johnson, Megan P, DO  PAXLOVID, 300/100, 20 x 150 MG & 10 x 100MG  TBPK Take by mouth. Patient not taking: Reported on 01/10/2021 11/15/20   [provider]  predniSONE (DELTASONE) 5 MG tablet Take by mouth. Patient not taking: Reported on 01/10/2021 10/24/20   [provider]    Inpatient Medications: Scheduled Meds:  aspirin EC  81 mg Oral Daily   cholecalciferol  1,000 Units Oral Daily   ezetimibe  10 mg Oral Daily   hydroxychloroquine  200 mg Oral BID   metoprolol succinate  150 mg Oral Daily   rosuvastatin  20 mg Oral QHS   sacubitril-valsartan  1 tablet Oral BID   spironolactone  25 mg Oral Daily   vitamin B-12  500 mcg Oral Daily   Continuous Infusions:  heparin 1,100 Units/hr (01/10/21 1119)   nitroGLYCERIN 15 mcg/min (01/10/21 1000)   PRN Meds: albuterol  Allergies:   No Known Allergies  Social History:   Social History   Tobacco Use   Smoking status: Never   Smokeless tobacco: Never  Vaping Use   Vaping Use: Never used  Substance Use Topics   Alcohol use: Yes    Comment: On occasion   Drug use: Never     Family History:   Family History  Problem Relation Age of Onset   Heart disease Mother    Heart attack Father    Arthritis Sister    Hypertension Sister    Hypertension  Brother    Heart disease Paternal Grandmother    Heart murmur Sister      ROS:  Please see the history of present illness.  All other ROS reviewed and negative.     Physical Exam/Data:   Vitals:   01/10/21 1030 01/10/21 1045 01/10/21 1100 01/10/21 1115  BP: 121/74 121/70 127/72 131/75  Pulse: (!) 57 68 70 70  Resp: 18 20 (!) 21 (!) 25  Temp:      TempSrc:      SpO2: 99% 95% 97% 97%  Weight:      Height:       No intake or output data in the 24 hours ending 01/10/21 1212 Last 3 Weights 01/10/2021 10/11/2020 10/05/2020  Weight (lbs) 190 lb 191 lb 12.8 oz 191 lb  Weight (kg) 86.183 kg 87 kg 86.637 kg     Body mass index is 29.76 kg/m.  General:  Well nourished, well developed, in no acute distress.  His wife is at the bedside. HEENT: normal Neck: no JVD Vascular: No carotid bruits; 2+ radial pulses bilaterally. Cardiac: Regular rate and rhythm with occasional extrasystoles and 1/6 systolic murmur.  No rubs or gallops. Lungs:  clear to auscultation bilaterally, no wheezing, rhonchi or rales  Abd: soft, nontender, no hepatomegaly  Ext: no edema Musculoskeletal:  No deformities, BUE and BLE strength normal and equal Skin: warm and dry  Neuro:  CNs 2-12 intact, no focal abnormalities noted Psych:  Normal affect   EKG:  The EKG was personally reviewed and demonstrates: Normal sinus rhythm with inferior T wave inversions and septal infarct.  Compared with prior tracing from 10/05/2020, inferior T wave inversions are new. Telemetry:  Telemetry was personally reviewed and demonstrates: Normal sinus rhythm with PVCs.  Relevant CV Studies: TTE (11/28/2020): Normal LV size and wall thickness.  LVEF 30-35% with akinesis of the mid/apical anterior/anteroseptal segments as well as the apex.  Normal RV size and wall thickness.  Mild-moderate pulmonary hypertension.  Mild left atrial enlargement.  Bioprosthetic aortic valve present with mean gradient 10 mmHg.  CVP 8 mmHg.  Laboratory  Data:  High Sensitivity Troponin:   Recent Labs  Lab 01/10/21 0841 01/10/21 0959  TROPONINIHS 82* 120*     Chemistry Recent Labs  Lab 01/10/21 0841  NA 137  K 4.1  CL 102  CO2 25  GLUCOSE 190*  BUN 16  CREATININE 0.93  CALCIUM 9.3  GFRNONAA >60  ANIONGAP 10    No results for input(s): PROT, ALBUMIN, AST, ALT, ALKPHOS, BILITOT in the last 168 hours. Lipids No results for input(s): CHOL, TRIG, HDL, LABVLDL, LDLCALC, CHOLHDL in the last 168 hours.  Hematology Recent Labs  Lab 01/10/21 0841  WBC 10.2  RBC 4.92  HGB 14.5  HCT 44.5  MCV 90.4  MCH 29.5  MCHC 32.6  RDW 14.2  PLT 249   Thyroid No results for input(s): TSH, FREET4 in the last 168 hours.  BNPNo results for input(s): BNP, PROBNP in the last 168 hours.  DDimer No results for input(s): DDIMER in the last 168 hours.   Radiology/Studies:  DG Chest 2 View  Result Date: 01/10/2021 CLINICAL DATA:  Chest pain EXAM: CHEST - 2 VIEW COMPARISON:  2018 FINDINGS: Chronic interstitial changes. No new consolidation or edema. No pleural effusion. Stable mild cardiomegaly. Valve replacement. Left chest wall ICD. No acute osseous abnormality. IMPRESSION: No acute process in the chest. Chronic interstitial changes and cardiomegaly. Electronically Signed   By: Macy Mis M.D.   On: 01/10/2021 09:00     Assessment and Plan:   NSTEMI: Mr. Chervenak presents with cute onset of chest pain this morning while helping his grandson get ready for school.  It has since abated with morphine and IV nitroglycerin.  EKG shows new inferior T wave inversions.  Troponin is also mildly elevated and uptrending.  We have discussed invasive and conservative strategies and have agreed to proceed with cardiac catheterization and possible PCI today. -Plan for left heart catheterization with possible PCI today. -In the meantime, continue aspirin and heparin infusion. -Titrate IV nitroglycerin for relief of chest pain. -Continue secondary prevention  with rosuvastatin and ezetimibe.  Chronic HFrEF due to ischemic cardiomyopathy: Mr. Golphin appears euvolemic on exam without  symptoms of acute decompensated heart failure. -Maintain net even fluid balance. -Continue home regimen of metoprolol succinate, Entresto, spironolactone, and dapagliflozin.  Hypertension: -Titrate nitroglycerin infusion. -Resume home medications.  Hyperlipidemia: Lipids at goal on last check in 10/2020. -Continue rosuvastatin and ezetimibe.  Type 2 diabetes mellitus: -Per internal medicine.   Risk Assessment/Risk Scores:     TIMI Risk Score for Unstable Angina or Non-ST Elevation MI:   The patient's TIMI risk score is 5, which indicates a 26% risk of all cause mortality, new or recurrent myocardial infarction or need for urgent revascularization in the next 14 days.  New York Heart Association (NYHA) Functional Class NYHA Class II     For questions or updates, please contact [facility] Please consult [email or web address] for contact info under    Signed, Nelva Bush, MD  01/10/2021 12:12 PM

## 2021-01-10 NOTE — ED Notes (Signed)
Pt to cath lab at this time

## 2021-01-10 NOTE — ED Notes (Signed)
Rainbow was sent to lab. 

## 2021-01-10 NOTE — ED Triage Notes (Signed)
Pt states that he had chest pain last pm that went away, states the pain came back at 0700 while he was getting his grandson ready for school, pt states that he has had a heart attack in the past and has a defibrillator

## 2021-01-10 NOTE — ED Provider Notes (Signed)
[facility] Emergency Department Provider Note   ____________________________________________    I have reviewed the triage vital signs and the nursing notes.   HISTORY  Chief Complaint Chest Pain     HPI Samuel Morris is a 75 y.o. male with history of diabetes, CAD, aortic valve replacement who presents with complaints of chest pain.  Patient reports he had a CABG 2 years ago, has been doing well since then, typically does not have any angina or chest discomfort.  This morning at 7 AM while getting his grandson ready for school he started to have pressure-like chest pain in center of his chest with occasional radiation to his left arm.  No shortness of breath.  No fevers chills or cough.  No calf pain or swelling.  Has taken nitroglycerin without improvement.  Past Medical History:  Diagnosis Date   Cancer ([place])    Scalp   Cataract    Diabetes mellitus without complication ([place])    Heart attack ([place])    Heart disease    Hyperlipidemia    Hypertension    Sleep apnea    Stenosis of aorta     Patient Active Problem List   Diagnosis Date Noted   COVID-19 11/16/2020   Senile purpura ([place]) 10/11/2020   Rheumatoid arthritis ([place]) 07/09/2020   Interstitial lung disease ([place]) 07/09/2020   CAD (coronary artery disease) 11/05/2018   Hyperlipidemia associated with type 2 diabetes mellitus ([place]) 11/05/2018   Type 2 diabetes mellitus with cardiac complication ([place]) 99/83/3825   OSA (obstructive sleep apnea) 11/05/2018   HTN (hypertension) 11/05/2018   Aortic stenosis 11/05/2018   Presence of automatic (implantable) cardiac defibrillator 11/05/2018   Hx of melanoma of skin 11/05/2018   Angina pectoris ([place]) 11/05/2018   Pulmonary nodules 11/05/2018   Dilated cardiomyopathy ([place]) 11/05/2018   Adhesive capsulitis of both shoulders 03/16/2018   AVN (avascular necrosis of bone) ([place]) 03/16/2018   Chronic pain of both shoulders 03/16/2018   Kyphoscoliosis  deformity of spine 03/16/2018   Bilateral cataracts 01/20/2018   Overweight 12/18/2017   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 Authorizing Provider  albuterol (VENTOLIN HFA) 108 (90 Base) MCG/ACT inhaler Inhale 1 puff into the lungs every 4 (four) hours as needed for wheezing or shortness of breath. 04/06/20   Myles Gip, DO  Ascorbic Acid (VITAMIN C PO) Take 100 mg by mouth daily.    [provider]  aspirin EC 81 MG tablet Take by mouth. 01/20/18   [provider]  benzonatate (TESSALON) 100 MG capsule Take 1 capsule (100 mg total) by mouth 2 (two) times daily as needed for cough. 07/08/20   McElwee, Lauren A, NP  Calcium-Magnesium 100-50 MG TABS Take by mouth.    [provider]  Cholecalciferol (VITAMIN D-1000 MAX ST) 25 MCG (1000 UT) tablet Take by mouth.    [provider]  dapagliflozin propanediol (FARXIGA) 10 MG TABS tablet Take by mouth daily.    [provider]  diclofenac Sodium (VOLTAREN) 1 % GEL SMARTSIG:4 Gram(s) Topical Twice Daily 09/30/20   [provider]  ENTRESTO 49-51 MG TK 1 T PO BID 10/24/18   [provider]  ezetimibe (ZETIA) 10 MG tablet Take 1 tablet (10 mg total) by mouth daily. 10/11/20   Johnson, Megan P, DO  GLUCOSAMINE-CHONDROITIN PO Take 1 tablet by mouth daily.    [provider]  hydroxychloroquine (PLAQUENIL) 200 MG tablet Take 200 mg by mouth 2 (two) times daily. 10/24/20    [provider]  meloxicam (MOBIC) 15 MG tablet Take 15 mg by mouth daily. Patient not taking: Reported on 11/16/2020 04/04/20   [provider]  metoprolol succinate (TOPROL-XL) 100 MG 24 hr tablet Take 1.5 tabs daily. Take with or immediately following a meal. 10/11/20   Johnson, Megan P, DO  nitroGLYCERIN (NITROSTAT) 0.4 MG SL tablet Place 1 tablet (0.4 mg total) under the tongue every 5 (five) minutes as needed for chest pain. 09/10/19   Johnson, Megan P, DO  PAXLOVID, 300/100, 20 x 150 MG & 10 x 100MG  TBPK Take by mouth. 11/15/20   [provider]  predniSONE (DELTASONE) 5 MG tablet Take by mouth. 10/24/20   [provider]  rosuvastatin (CRESTOR) 20 MG tablet Take 1 tablet (20 mg total) by mouth at bedtime. 10/11/20   Park Liter P, DO  spironolactone (ALDACTONE) 25 MG tablet Take 1 tablet (25 mg total) by mouth daily. 10/11/20   Johnson, Megan P, DO  vitamin B-12 (CYANOCOBALAMIN) 1000 MCG tablet Take by mouth.    [provider]     Allergies Patient has no known allergies.  Family History  Problem Relation Age of Onset   Heart disease Mother    Heart attack Father    Arthritis Sister    Hypertension Sister    Hypertension Brother    Heart disease Paternal Grandmother    Heart murmur Sister     Social History Social History   Tobacco Use   Smoking status: Never   Smokeless tobacco: Never  Vaping Use   Vaping Use: Never used  Substance Use Topics   Alcohol use: Yes    Comment: On occasion   Drug use: Never    Review of Systems  Constitutional: No fever/chills Eyes: No visual changes.  ENT: No sore throat. Cardiovascular: As above Respiratory: Denies shortness of breath. Gastrointestinal: No abdominal pain.  No nausea, no vomiting.   Genitourinary: Negative for dysuria. Musculoskeletal: Negative for back pain. Skin: Negative for rash. Neurological: Negative for headaches or  weakness   ____________________________________________   PHYSICAL EXAM:  VITAL SIGNS: ED Triage Vitals  Enc Vitals Group     BP 01/10/21 0831 (!) 157/102     Pulse Rate 01/10/21 0831 70     Resp 01/10/21 0831 18     Temp 01/10/21 0831 97.8 F (36.6 C)     Temp Source 01/10/21 0831 Oral     SpO2 01/10/21 0831 96 %     Weight 01/10/21 0826 86.2 kg (190 lb)     Height 01/10/21 0826 1.702 m (5\' 7" )     Head Circumference --      Peak Flow --      Pain Score 01/10/21 0825 9     Pain Loc --      Pain Edu? --      Excl. in [place]? --     Constitutional: Alert and oriented. No acute distress. Eyes: Conjunctivae are normal.  Nose: No congestion/rhinnorhea. Mouth/Throat: Mucous membranes are moist.   Neck:  Painless ROM Cardiovascular: Normal rate, regular rhythm. Good peripheral circulation. Respiratory: Normal respiratory effort.  No retractions. Lungs CTAB. Gastrointestinal: Soft and nontender. No distention.   Musculoskeletal: No lower extremity tenderness nor edema.  Warm and well perfused Neurologic:  Normal speech and language. No gross focal neurologic deficits are appreciated.  Skin:  Skin is warm, dry and intact. No rash noted. Psychiatric: Mood and affect are normal. Speech and behavior are normal.  ____________________________________________   LABS (all labs ordered are listed, but only abnormal results are displayed)  Labs Reviewed  BASIC METABOLIC PANEL - Abnormal; Notable for the following components:      Result Value   Glucose, Bld 190 (*)    All other components within normal limits  TROPONIN I (HIGH SENSITIVITY) - Abnormal; Notable for the following components:   Troponin I (High Sensitivity) 82 (*)    All other components within normal limits  CBC   ____________________________________________  EKG  ED ECG REPORT I, Lavonia Drafts, the attending physician, personally viewed and interpreted this ECG.  Date: 01/10/2021  Rhythm: normal sinus  rhythm QRS Axis: normal Intervals: normal ST/T Wave abnormalities: flipped t waves inferiorly Narrative Interpretation: concerning for acs  ____________________________________________  RADIOLOGY  Cxr reviewed by me no acute abnormality  ____________________________________________   PROCEDURES  Procedure(s) performed: No  Procedures   Critical Care performed: yes  CRITICAL CARE Performed by: Lavonia Drafts   Total critical care time: 30 minutes  Critical care time was exclusive of separately billable procedures and treating other patients.  Critical care was necessary to treat or prevent imminent or life-threatening deterioration.  Critical care was time spent personally by me on the following activities: development of treatment plan with patient and/or surrogate as well as nursing, discussions with consultants, evaluation of patient's response to treatment, examination of patient, obtaining history from patient or surrogate, ordering and performing treatments and interventions, ordering and review of laboratory studies, ordering and review of radiographic studies, pulse oximetry and re-evaluation of patient's condition.  ____________________________________________   INITIAL IMPRESSION / ASSESSMENT AND PLAN / ED COURSE  Pertinent labs & imaging results that were available during my care of the patient were reviewed by me and considered in my medical decision making (see chart for details).   Patient presents with chest pain as described above, significant cardiac history, concerning for ACS especially given EKG with inferior T wave inversions  Cxr is reassuring  HS-troponin is elevated at 82, still with chest pain. Will start nitro gtt and heparin gtt  Consulted Dr. Saunders Revel who will see the patient in the ED.  Consulted hospitalist service for admission.    ____________________________________________   FINAL CLINICAL IMPRESSION(S) / ED DIAGNOSES  Final  diagnoses:  NSTEMI (non-ST elevated myocardial infarction) [facility])        Note:  This document was prepared using Dragon voice recognition software and may include unintentional dictation errors.    Lavonia Drafts, MD 01/10/21 [phone]

## 2021-01-11 ENCOUNTER — Encounter: Payer: Self-pay | Admitting: Internal Medicine

## 2021-01-11 ENCOUNTER — Inpatient Hospital Stay (HOSPITAL_COMMUNITY)
Admit: 2021-01-11 | Discharge: 2021-01-11 | Disposition: A | Discharge status: Home / Self Care | Payer: Medicare Other | Attending: Internal Medicine | Admitting: Internal Medicine

## 2021-01-11 DIAGNOSIS — I255 Ischemic cardiomyopathy: Secondary | ICD-10-CM | POA: Diagnosis not present

## 2021-01-11 DIAGNOSIS — I2510 Atherosclerotic heart disease of native coronary artery without angina pectoris: Secondary | ICD-10-CM | POA: Diagnosis not present

## 2021-01-11 DIAGNOSIS — I214 Non-ST elevation (NSTEMI) myocardial infarction: Secondary | ICD-10-CM | POA: Diagnosis not present

## 2021-01-11 LAB — BASIC METABOLIC PANEL
Anion gap: 9 (ref 5–15)
BUN: 16 mg/dL (ref 8–23)
CO2: 28 mmol/L (ref 22–32)
Calcium: 9.3 mg/dL (ref 8.9–10.3)
Chloride: 99 mmol/L (ref 98–111)
Creatinine, Ser: 0.96 mg/dL (ref 0.61–1.24)
GFR, Estimated: >60 mL/min (ref >60)
Glucose, Bld: 148 mg/dL — ABNORMAL HIGH (ref 70–99)
Potassium: 4.1 mmol/L (ref 3.5–5.1)
Sodium: 136 mmol/L (ref 135–145)

## 2021-01-11 LAB — ECHOCARDIOGRAM LIMITED
Calc EF: 22.5 %
Height: 67 in
S' Lateral: 4.5 cm
Single Plane A2C EF: 32.9 %
Single Plane A4C EF: 13.5 %
Weight: 3081.6 oz

## 2021-01-11 LAB — GLUCOSE, CAPILLARY
Glucose-Capillary: 150 mg/dL — ABNORMAL HIGH (ref 70–99)
Glucose-Capillary: 169 mg/dL — ABNORMAL HIGH (ref 70–99)
Glucose-Capillary: 134 mg/dL — ABNORMAL HIGH (ref 70–99)
Glucose-Capillary: 158 mg/dL — ABNORMAL HIGH (ref 70–99)

## 2021-01-11 NOTE — Progress Notes (Signed)
*  PRELIMINARY RESULTS* Echocardiogram 2D Echocardiogram has been performed.  Samuel Morris 01/11/2021, 8:05 AM

## 2021-01-11 NOTE — Progress Notes (Addendum)
PROGRESS NOTE    Samuel Morris  OMV:[id number] DOB: 04/08/1945 DOA: 01/10/2021 PCP: Valerie Roys, DO   Brief Narrative:  This 75 y.o. male with medical history significant for type 2 diabetes mellitus, CAD with previous MI s/p CABG, hyperlipidemia, hypertension, aortic stenosis s/p bovine aortic valve, OSA, who presented to the hospital with chest pain.  Chest pain was severe (graded as 9/10 in severity).  It started last night but it went away and came back this morning while he was getting his grandson ready for school.  It radiated to his left arm.  There was no known relieving or aggravating factors.  Pain resolved with IV nitroglycerin in the ED. Troponin were elevated at 82.  He was started on IV heparin and IV nitroglycerin infusion. Cardiology consulted,  Patient underwent left heart cath found to have a multivessel disease. Patient currently remains asymptomatic.  Continue cardioprotective medications.  Assessment & Plan:   Active Problems:   NSTEMI (non-ST elevated myocardial infarction) [facility])   NSTEMI: Patient presented with typical chest pain and elevated troponins. Patient was started on IV heparin and IV nitroglycerin. Cardiology consulted,  Patient underwent left heart cath.  Found to have multivessel disease. Continue cardioprotective medications, aspirin, Plavix, Crestor, Toprol XL, Imdur, nitroglycerin Patient is currently remains asymptomatic.  PT and OT eval. If patient remains asymptomatic while ambulation,  patient can be discharged tomorrow. If he becomes symptomatic having chest pain patient may need PCI intervention to the occluded vessel.   Chronic systolic CHF /ischemic cardiomyopathy/status post AICD: S/p bovine aortic valve replacement. Patient appears euvolemic, Continue Entresto, Toprol and Aldactone. Continue Lasix 40 mg daily  Essential hypertension: Continue home medications.  Type 2 diabetes: Continue regular insulin sliding  scale  Hyperlipidemia: Continue Crestor  DVT prophylaxis: Heparin. Code Status: Full code Family Communication: No family at bedside Disposition Plan:   Status is: Inpatient  Remains inpatient appropriate because: Admitted for NSTEMI, underwent left heart cath found to have multivessel disease.  Anticipated discharge home in 1 to 2 days.  Consultants:  Cardiology  Procedures: Left heart cath Antimicrobials:   Anti-infectives (From admission, onward)    Start     Dose/Rate Route Frequency Ordered Stop   01/10/21 1300  hydroxychloroquine (PLAQUENIL) tablet 200 mg        200 mg Oral 2 times daily 01/10/21 1155         Subjective: Patient was seen and examined at bedside.  Overnight events noted.  Patient reports feeling better, he denies any further chest pain.  Objective: Vitals:   01/10/21 2337 01/11/21 0350 01/11/21 0739 01/11/21 1131  BP: (!) 90/51 99/70 133/80 111/86  Pulse: 99 94 96 (!) 45  Resp: 18 17    Temp: 98.9 F (37.2 C) 98.8 F (37.1 C) 97.8 F (36.6 C) 98.1 F (36.7 C)  TempSrc:  Oral  Oral  SpO2: 93% 92% 96% 96%  Weight:      Height:        Intake/Output Summary (Last 24 hours) at 01/11/2021 1422 Last data filed at 01/11/2021 1200 Gross per 24 hour  Intake 981.75 ml  Output 3175 ml  Net -2193.25 ml   Filed Weights   01/10/21 0826 01/10/21 1249 01/10/21 1717  Weight: 86.2 kg 86.2 kg 87.4 kg    Examination:  General exam: Appears comfortable, not in any acute distress. Respiratory system: Clear to auscultation. Respiratory effort normal.  RR 15 Cardiovascular system: S1 & S2 heard, regular rate and rhythm, murmur +. gastrointestinal system:  Abdomen is soft, nontender, nondistended, BS+ Central nervous system: Alert and oriented  X3 . No focal neurological deficits. Extremities: Symmetric 5 x 5 power. Skin: No rashes, lesions or ulcers Psychiatry: Judgement and insight appear normal. Mood & affect appropriate.     Data Reviewed: I have  personally reviewed following labs and imaging studies  CBC: Recent Labs  Lab 01/10/21 0841  WBC 10.2  HGB 14.5  HCT 44.5  MCV 90.4  PLT 767   Basic Metabolic Panel: Recent Labs  Lab 01/10/21 0841 01/11/21 0702  NA 137 136  K 4.1 4.1  CL 102 99  CO2 25 28  GLUCOSE 190* 148*  BUN 16 16  CREATININE 0.93 0.96  CALCIUM 9.3 9.3   GFR: Estimated Creatinine Clearance: 69.1 mL/min (by C-G formula based on SCr of 0.96 mg/dL). Liver Function Tests: No results for input(s): AST, ALT, ALKPHOS, BILITOT, PROT, ALBUMIN in the last 168 hours. No results for input(s): LIPASE, AMYLASE in the last 168 hours. No results for input(s): AMMONIA in the last 168 hours. Coagulation Profile: Recent Labs  Lab 01/10/21 0959  INR 1.0   Cardiac Enzymes: No results for input(s): CKTOTAL, CKMB, CKMBINDEX, TROPONINI in the last 168 hours. BNP (last 3 results) No results for input(s): PROBNP in the last 8760 hours. HbA1C: No results for input(s): HGBA1C in the last 72 hours. CBG: Recent Labs  Lab 01/10/21 1744 01/10/21 2026 01/11/21 0740 01/11/21 1132  GLUCAP 151* 119* 150* 169*   Lipid Profile: No results for input(s): CHOL, HDL, LDLCALC, TRIG, CHOLHDL, LDLDIRECT in the last 72 hours. Thyroid Function Tests: No results for input(s): TSH, T4TOTAL, FREET4, T3FREE, THYROIDAB in the last 72 hours. Anemia Panel: No results for input(s): VITAMINB12, FOLATE, FERRITIN, TIBC, IRON, RETICCTPCT in the last 72 hours. Sepsis Labs: No results for input(s): PROCALCITON, LATICACIDVEN in the last 168 hours.  Recent Results (from the past 240 hour(s))  Resp Panel by RT-PCR (Flu A&B, Covid) Nasopharyngeal Swab     Status: None   Collection Time: 01/10/21  9:59 AM   Specimen: Nasopharyngeal Swab; Nasopharyngeal(NP) swabs in vial transport medium  Result Value Ref Range Status   SARS Coronavirus 2 by RT PCR NEGATIVE NEGATIVE Final    Comment: (NOTE) SARS-CoV-2 target nucleic acids are NOT  DETECTED.  The SARS-CoV-2 RNA is generally detectable in upper respiratory specimens during the acute phase of infection. The lowest concentration of SARS-CoV-2 viral copies this assay can detect is 138 copies/mL. A negative result does not preclude SARS-Cov-2 infection and should not be used as the sole basis for treatment or other patient management decisions. A negative result may occur with  improper specimen collection/handling, submission of specimen other than nasopharyngeal swab, presence of viral mutation(s) within the areas targeted by this assay, and inadequate number of viral copies(<138 copies/mL). A negative result must be combined with clinical observations, patient history, and epidemiological information. The expected result is Negative.  Fact Sheet for Patients:  EntrepreneurPulse.com.au  Fact Sheet for Healthcare Providers:  IncredibleEmployment.be  This test is no t yet approved or cleared by the [place] FDA and  has been authorized for detection and/or diagnosis of SARS-CoV-2 by FDA under an Emergency Use Authorization (EUA). This EUA will remain  in effect (meaning this test can be used) for the duration of the COVID-19 declaration under Section 564(b)(1) of the Act, 21 U.S.C.section 360bbb-3(b)(1), unless the authorization is terminated  or revoked sooner.       Influenza A by PCR NEGATIVE NEGATIVE  Final   Influenza B by PCR NEGATIVE NEGATIVE Final    Comment: (NOTE) The Xpert Xpress SARS-CoV-2/FLU/RSV plus assay is intended as an aid in the diagnosis of influenza from Nasopharyngeal swab specimens and should not be used as a sole basis for treatment. Nasal washings and aspirates are unacceptable for Xpert Xpress SARS-CoV-2/FLU/RSV testing.  Fact Sheet for Patients: EntrepreneurPulse.com.au  Fact Sheet for Healthcare Providers: IncredibleEmployment.be  This test is not yet  approved or cleared by the [place] FDA and has been authorized for detection and/or diagnosis of SARS-CoV-2 by FDA under an Emergency Use Authorization (EUA). This EUA will remain in effect (meaning this test can be used) for the duration of the COVID-19 declaration under Section 564(b)(1) of the Act, 21 U.S.C. section 360bbb-3(b)(1), unless the authorization is terminated or revoked.  Performed at [facility], [address], [place], [place] [address]     Radiology Studies: DG Chest 2 View  Result Date: 01/10/2021 CLINICAL DATA:  Chest pain EXAM: CHEST - 2 VIEW COMPARISON:  2018 FINDINGS: Chronic interstitial changes. No new consolidation or edema. No pleural effusion. Stable mild cardiomegaly. Valve replacement. Left chest wall ICD. No acute osseous abnormality. IMPRESSION: No acute process in the chest. Chronic interstitial changes and cardiomegaly. Electronically Signed   By: Macy Mis M.D.   On: 01/10/2021 09:00   CARDIAC CATHETERIZATION  Result Date: 01/10/2021 Conclusions: Severe multivessel coronary artery disease with chronic total occlusions of the ostial/proximal LAD and mid RCA as well as moderate-severe disease involving D1, ramus intermedius, and LCx/OM branches. Small but patent LIMA-LAD with 99% stenosis at distal anastomosis and TIMI I flow into relatively small mid/distal LAD. Widely patent SVG-PDA.  PDA does not backfill the PL branches, which are chronically occluded and fill via left-right collaterals. Severely elevated left ventricular filling pressure (LVEDP 40 mmHg). Bioprosthetic aortic valve with mild gradient (peak-to-peak gradient 10-15 mmHg). Recommendations: Diuresis and escalate antianginal therapy.  Will discontinue IV nitroglycerin and add isosorbide mononitrate 30 mg daily. Consider PCI distal LIMA-LAD anastomosis once volume status is optimized (inpatient versus outpatient to be determined by symptoms and hospital course), though  intervention will be challenging given tortuosity/angulation of the involved vessels as well as relatively small size (approximately 2 mm in diameter). Loaded with clopidogrel with plans for dual antiplatelet therapy with aspirin and clopidogrel for 12 months. Aggressive secondary prevention of coronary artery disease and continued goal-directed medical therapy of acute on chronic HFrEF due to ischemic cardiomyopathy. Nelva Bush, MD [facility]   Scheduled Meds:  aspirin EC  81 mg Oral Daily   cholecalciferol  1,000 Units Oral Daily   clopidogrel  75 mg Oral Q breakfast   enoxaparin (LOVENOX) injection  40 mg Subcutaneous Q24H   ezetimibe  10 mg Oral Daily   furosemide  40 mg Intravenous Daily   hydroxychloroquine  200 mg Oral BID   insulin aspart  0-9 Units Subcutaneous TID WC   isosorbide mononitrate  30 mg Oral Daily   metoprolol succinate  150 mg Oral Daily   rosuvastatin  20 mg Oral QHS   sacubitril-valsartan  1 tablet Oral BID   sodium chloride flush  3 mL Intravenous Q12H   spironolactone  25 mg Oral Daily   vitamin B-12  500 mcg Oral Daily   Continuous Infusions:  sodium chloride       LOS: 1 day    Time spent: 35 mins    Brycin Kille, MD [facility]   If 7PM-7AM, please contact  night-coverage

## 2021-01-11 NOTE — Progress Notes (Signed)
Progress Note  Patient Name: Samuel Morris Date of Encounter: 01/11/2021  [facility] Cardiologist: End  Subjective   Patient denies further chest pain. Breathing is stable. UOP -2.4L overnight. Cath site, left wrist, stable  Inpatient Medications    Scheduled Meds:  aspirin EC  81 mg Oral Daily   cholecalciferol  1,000 Units Oral Daily   clopidogrel  75 mg Oral Q breakfast   enoxaparin (LOVENOX) injection  40 mg Subcutaneous Q24H   ezetimibe  10 mg Oral Daily   furosemide  40 mg Intravenous Daily   hydroxychloroquine  200 mg Oral BID   insulin aspart  0-9 Units Subcutaneous TID WC   isosorbide mononitrate  30 mg Oral Daily   metoprolol succinate  150 mg Oral Daily   rosuvastatin  20 mg Oral QHS   sacubitril-valsartan  1 tablet Oral BID   sodium chloride flush  3 mL Intravenous Q12H   spironolactone  25 mg Oral Daily   vitamin B-12  500 mcg Oral Daily   Continuous Infusions:  sodium chloride     PRN Meds: sodium chloride, acetaminophen, acetaminophen, albuterol, nitroGLYCERIN, ondansetron (ZOFRAN) IV, ondansetron (ZOFRAN) IV, sodium chloride flush   Vital Signs    Vitals:   01/10/21 1958 01/10/21 2337 01/11/21 0350 01/11/21 0739  BP: 127/74 (!) 90/51 99/70 133/80  Pulse: 98 99 94 96  Resp: 17 18 17    Temp: 98.8 F (37.1 C) 98.9 F (37.2 C) 98.8 F (37.1 C) 97.8 F (36.6 C)  TempSrc:   Oral   SpO2: 96% 93% 92% 96%  Weight:      Height:        Intake/Output Summary (Last 24 hours) at 01/11/2021 0855 Last data filed at 01/11/2021 0800 Gross per 24 hour  Intake 741.75 ml  Output 2625 ml  Net -1883.25 ml   Last 3 Weights 01/10/2021 01/10/2021 01/10/2021  Weight (lbs) 192 lb 9.6 oz 190 lb 190 lb  Weight (kg) 87.363 kg 86.183 kg 86.183 kg      Telemetry    SR/ST, HR 90-100, up to 100bpm, frequent PVCs, NSVT 2 beats - Personally Reviewed  ECG    No new - Personally Reviewed  Physical Exam   GEN: No acute distress.   Neck: No JVD Cardiac: RRR, no  murmurs, rubs, or gallops.  Respiratory: Clear to auscultation bilaterally. GI: Soft, nontender, non-distended  MS: No edema; No deformity. Neuro:  Nonfocal  Psych: Normal affect   Labs    High Sensitivity Troponin:   Recent Labs  Lab 01/10/21 0841 01/10/21 0959  TROPONINIHS 82* 120*     Chemistry Recent Labs  Lab 01/10/21 0841 01/11/21 0702  NA 137 136  K 4.1 4.1  CL 102 99  CO2 25 28  GLUCOSE 190* 148*  BUN 16 16  CREATININE 0.93 0.96  CALCIUM 9.3 9.3  GFRNONAA >60 >60  ANIONGAP 10 9    Lipids No results for input(s): CHOL, TRIG, HDL, LABVLDL, LDLCALC, CHOLHDL in the last 168 hours.  Hematology Recent Labs  Lab 01/10/21 0841  WBC 10.2  RBC 4.92  HGB 14.5  HCT 44.5  MCV 90.4  MCH 29.5  MCHC 32.6  RDW 14.2  PLT 249   Thyroid No results for input(s): TSH, FREET4 in the last 168 hours.  BNPNo results for input(s): BNP, PROBNP in the last 168 hours.  DDimer No results for input(s): DDIMER in the last 168 hours.   Radiology    DG Chest 2 View  Result Date: 01/10/2021 CLINICAL DATA:  Chest pain EXAM: CHEST - 2 VIEW COMPARISON:  2018 FINDINGS: Chronic interstitial changes. No new consolidation or edema. No pleural effusion. Stable mild cardiomegaly. Valve replacement. Left chest wall ICD. No acute osseous abnormality. IMPRESSION: No acute process in the chest. Chronic interstitial changes and cardiomegaly. Electronically Signed   By: Macy Mis M.D.   On: 01/10/2021 09:00   CARDIAC CATHETERIZATION  Result Date: 01/10/2021 Conclusions: Severe multivessel coronary artery disease with chronic total occlusions of the ostial/proximal LAD and mid RCA as well as moderate-severe disease involving D1, ramus intermedius, and LCx/OM branches. Small but patent LIMA-LAD with 99% stenosis at distal anastomosis and TIMI I flow into relatively small mid/distal LAD. Widely patent SVG-PDA.  PDA does not backfill the PL branches, which are chronically occluded and fill via  left-right collaterals. Severely elevated left ventricular filling pressure (LVEDP 40 mmHg). Bioprosthetic aortic valve with mild gradient (peak-to-peak gradient 10-15 mmHg). Recommendations: Diuresis and escalate antianginal therapy.  Will discontinue IV nitroglycerin and add isosorbide mononitrate 30 mg daily. Consider PCI distal LIMA-LAD anastomosis once volume status is optimized (inpatient versus outpatient to be determined by symptoms and hospital course), though intervention will be challenging given tortuosity/angulation of the involved vessels as well as relatively small size (approximately 2 mm in diameter). Loaded with clopidogrel with plans for dual antiplatelet therapy with aspirin and clopidogrel for 12 months. Aggressive secondary prevention of coronary artery disease and continued goal-directed medical therapy of acute on chronic HFrEF due to ischemic cardiomyopathy. Nelva Bush, MD [facility]   Cardiac Studies   Echo 11/28/20  1. Left ventricular ejection fraction, by estimation, is 30 to 35%. The  left ventricle has moderately decreased function. The left ventricle  demonstrates regional wall motion abnormalities (see scoring  diagram/findings for description). Left ventricular   diastolic parameters are consistent with Grade I diastolic dysfunction  (impaired relaxation). There is akinesis of the left ventricular,  mid-apical anteroseptal wall, anterior wall and apical segment.   2. Right ventricular systolic function is normal. The right ventricular  size is normal. There is mildly elevated pulmonary artery systolic  pressure.   3. Left atrial size was mildly dilated.   4. The mitral valve is normal in structure. No evidence of mitral valve  regurgitation. No evidence of mitral stenosis.   5. The aortic valve has been repaired/replaced. Aortic valve  regurgitation is not visualized. No aortic stenosis is present. There is a  bovine valve present in the aortic position.  Echo findings are consistent  with normal structure and function of the  aortic valve prosthesis. Aortic valve mean gradient measures 10.0 mmHg.   6. The inferior vena cava is dilated in size with >50% respiratory  variability, suggesting right atrial pressure of 8 mmHg.   Cardiac cath 01/10/21 Conclusions: Severe multivessel coronary artery disease with chronic total occlusions of the ostial/proximal LAD and mid RCA as well as moderate-severe disease involving D1, ramus intermedius, and LCx/OM branches. Small but patent LIMA-LAD with 99% stenosis at distal anastomosis and TIMI I flow into relatively small mid/distal LAD. Widely patent SVG-PDA.  PDA does not backfill the PL branches, which are chronically occluded and fill via left-right collaterals. Severely elevated left ventricular filling pressure (LVEDP 40 mmHg). Bioprosthetic aortic valve with mild gradient (peak-to-peak gradient 10-15 mmHg).   Recommendations: Diuresis and escalate antianginal therapy.  Will discontinue IV nitroglycerin and add isosorbide mononitrate 30 mg daily. Consider PCI distal LIMA-LAD anastomosis once volume status is optimized (inpatient versus  outpatient to be determined by symptoms and hospital course), though intervention will be challenging given tortuosity/angulation of the involved vessels as well as relatively small size (approximately 2 mm in diameter). Loaded with clopidogrel with plans for dual antiplatelet therapy with aspirin and clopidogrel for 12 months. Aggressive secondary prevention of coronary artery disease and continued goal-directed medical therapy of acute on chronic HFrEF due to ischemic cardiomyopathy.   Nelva Bush, MD [facility]  Coronary Diagrams  Diagnostic Dominance: Right      Patient Profile     75 y.o. male with a hx of coronary artery disease with remote MI and subsequent CABG (2018 at [facility]; LIMA-LAD and SVG-PDA), chronic HFrEF due to ischemic cardiomyopathy  complicated by ventricular tachycardia status post ICD, aortic stenosis status post bioprosthetic AVR (2018 at [facility]; 25 mm [facility]) hypertension, hyperlipidemia, type 2 diabetes mellitus, and obstructive sleep apnea, who is being seen 01/10/2021 for the evaluation of NSTEMI.  Assessment & Plan    NSTEMI CAD with prior CABG in 2018 at [facility] with LIMA to LAD, SVG-PDA) - cath showed CTO ostial/proximal LAD and mid RCA as well as moderate-severe disease involving D1, ramus intermedius, and Lcx/OM branches - small but patent LIMA to LAD with 99% stenosis at distal anastomosis, widely patent SVG-PDA, severely elevated LVEDP 83mmHg, bioprosthetic aortic valve with mild gradient 10-55mmHg. Recommendations for PCI to distal LIMA-LAD once volume status is optimized - loaded with plavix with plans for DAPT with ASA and Plavix for 12 month - Continue Crestor, Zetia, Toprol-XL 150mg  daily - started on Imdur 30mg  daily - Patient denies further chest pain - Cath site stable  Chronic HFrEF due to ICM - elevated LVEDP on cath and started on IV lasix 40mg  daily - UOP -2.4L - Continue Entresto, Toprol, spironolactone - kidney function stable - limited echo ordered  HTN - Bps intermittently soft - continue current medications  HLD - LDL 58 10/2020 - Lipids at goal - continue Crestor and Zetia  AS s/p bioprosthetic AVR (2018 at [facility]) - Cath showed mild gradient 10-5mmHg  DM2 - per IM  For questions or updates, please contact [facility] Please consult [email or web address] for contact info under        Signed, Denisia Harpole Ninfa Meeker, PA-C  01/11/2021, 8:55 AM

## 2021-01-11 NOTE — Consult Note (Signed)
   Heart Failure Nurse Navigator Note  HFrEF 30 to 35%.  Regional wall motion abnormalities noted in mid apical anterior wall, anterior wall and apical segment with akinesis.  Normal right ventricular systolic function.  Mildly elevated pulmonary artery systolic pressures.  Left atrial is mildly dilated.  Bovine valve in the aortic position with aortic valve mean gradient of 10 mmHg.  He presented to the emergency room with complaints of chest pressure that radiated to the left arm.  After admission patient underwent cardiac catheterization which revealed severe multivessel disease, noted was a small but patent LIMA to the LAD with a 99% stenosis at the distal anastomosis.  Also noted was severely elevated left ventricular end-diastolic pressures of 40 mmHg  Comorbidities:  Diabetes MI Hyperlipidemia Hypertension Sleep apnea  History of aortic valve replacement ICD placement  Medications:  Aspirin 81 mg daily Plavix 75 mg daily Zetia 10 mg daily Furosemide 40 mg IV daily Isosorbide mononitrate 30 mg daily Metoprolol succinate 150 mg daily Crestor 20 mg daily Entresto 49/51 mg 2 times a day Spironolactone 25 mg daily  Labs:  Sodium 136, potassium 4.1, chloride 99, CO2 28, BUN 16, creatinine 0.96 Weight is 87.4 Blood pressure 111/86 Intake 381 mL Output 2400 mL   Initial meeting with patient and wife who is a Equities trader.  Discussed how he takes care of himself at home.  He states that he feels that he drinks about a gallon of water daily but goes on to state that he is very active with working in the garden and with woodworking and does a lot of perspiring.  He does weigh himself daily and if he notes weight gain that he backs off on the amount of fluid that he is taking in.  He has not had any problems with edema, PND or orthopnea.  Discussed his diet, they do a lot of gardening and eat fresh vegetables from the garden and very little meat, especially with as  expensive as things are getting.  Wife states that once in a while we will use a bouillon cube. If they do have a meat it is usually chicken.  Explain the outpatient heart failure clinic.  He was given an appointment for November 15 at 10:30 in the morning.  He has a 7% of no-shows with 2 out of 27 appointments.  He was given the living with heart failure teaching booklet along with low-sodium handout.  They had no further questions.  Pricilla Riffle RN [facility]

## 2021-01-12 ENCOUNTER — Other Ambulatory Visit: Payer: Self-pay | Admitting: Cardiovascular Disease

## 2021-01-12 DIAGNOSIS — I255 Ischemic cardiomyopathy: Secondary | ICD-10-CM | POA: Diagnosis not present

## 2021-01-12 DIAGNOSIS — I214 Non-ST elevation (NSTEMI) myocardial infarction: Secondary | ICD-10-CM | POA: Diagnosis not present

## 2021-01-12 DIAGNOSIS — I472 Ventricular tachycardia, unspecified: Secondary | ICD-10-CM | POA: Diagnosis not present

## 2021-01-12 DIAGNOSIS — I5043 Acute on chronic combined systolic (congestive) and diastolic (congestive) heart failure: Secondary | ICD-10-CM | POA: Diagnosis not present

## 2021-01-12 LAB — BASIC METABOLIC PANEL
Anion gap: 10 (ref 5–15)
BUN: 20 mg/dL (ref 8–23)
CO2: 25 mmol/L (ref 22–32)
Calcium: 9.2 mg/dL (ref 8.9–10.3)
Chloride: 99 mmol/L (ref 98–111)
Creatinine, Ser: 0.92 mg/dL (ref 0.61–1.24)
GFR, Estimated: >60 mL/min (ref >60)
Glucose, Bld: 140 mg/dL — ABNORMAL HIGH (ref 70–99)
Potassium: 3.8 mmol/L (ref 3.5–5.1)
Sodium: 134 mmol/L — ABNORMAL LOW (ref 135–145)

## 2021-01-12 LAB — HEMOGLOBIN A1C
Hgb A1c MFr Bld: 6.8 % — ABNORMAL HIGH (ref 4.8–5.6)
Mean Plasma Glucose: 148.46 mg/dL

## 2021-01-12 LAB — GLUCOSE, CAPILLARY
Glucose-Capillary: 157 mg/dL — ABNORMAL HIGH (ref 70–99)
Glucose-Capillary: 124 mg/dL — ABNORMAL HIGH (ref 70–99)
Glucose-Capillary: 138 mg/dL — ABNORMAL HIGH (ref 70–99)
Glucose-Capillary: 123 mg/dL — ABNORMAL HIGH (ref 70–99)

## 2021-01-12 MED ORDER — NITROGLYCERIN 0.4 MG SL SUBL
0.4000 mg | SUBLINGUAL_TABLET | SUBLINGUAL | 3 refills | Status: AC | PRN
Start: 2021-01-12 — End: ?

## 2021-01-12 MED ORDER — DAPAGLIFLOZIN PROPANEDIOL 5 MG PO TABS
10.0000 mg | ORAL_TABLET | Freq: Every day | ORAL | Status: DC
  Administered 2021-01-12 – 2021-01-13 (×2): 10 mg via ORAL
  Filled 2021-01-12 (×2): qty 2

## 2021-01-12 MED ORDER — FUROSEMIDE 40 MG PO TABS
40.0000 mg | ORAL_TABLET | Freq: Every day | ORAL | Status: DC
  Administered 2021-01-13: 40 mg via ORAL
  Filled 2021-01-12: qty 1

## 2021-01-12 NOTE — Progress Notes (Addendum)
Progress Note  Patient Name: Samuel Morris Date of Encounter: 01/12/2021  Primary Cardiologist: New to [facility] - consult by End  Subjective   No chest pain or dyspnea. No dizziness, presyncope, or syncope. Documented UOP ~ 500 mL for the past 24 hours and net - 2.4 L for the admission. No recent weights. Renal function stable. Rare soft BP. Notes a history of positional dizziness at home.   Inpatient Medications    Scheduled Meds:  aspirin EC  81 mg Oral Daily   cholecalciferol  1,000 Units Oral Daily   clopidogrel  75 mg Oral Q breakfast   enoxaparin (LOVENOX) injection  40 mg Subcutaneous Q24H   ezetimibe  10 mg Oral Daily   furosemide  40 mg Intravenous Daily   hydroxychloroquine  200 mg Oral BID   insulin aspart  0-9 Units Subcutaneous TID WC   isosorbide mononitrate  30 mg Oral Daily   metoprolol succinate  150 mg Oral Daily   rosuvastatin  20 mg Oral QHS   sacubitril-valsartan  1 tablet Oral BID   sodium chloride flush  3 mL Intravenous Q12H   spironolactone  25 mg Oral Daily   vitamin B-12  500 mcg Oral Daily   Continuous Infusions:  sodium chloride     PRN Meds: sodium chloride, acetaminophen, acetaminophen, albuterol, nitroGLYCERIN, ondansetron (ZOFRAN) IV, ondansetron (ZOFRAN) IV, sodium chloride flush   Vital Signs    Vitals:   01/11/21 1925 01/12/21 0047 01/12/21 0359 01/12/21 0738  BP: 104/71 (!) 118/59 121/66 (!) 144/68  Pulse: (!) 40 87 89 87  Resp: 18 20 20    Temp: 98.4 F (36.9 C) 98.2 F (36.8 C) 98.2 F (36.8 C)   TempSrc:      SpO2: 97% 94% 94% 97%  Weight:      Height:        Intake/Output Summary (Last 24 hours) at 01/12/2021 1128 Last data filed at 01/12/2021 0950 Gross per 24 hour  Intake 243 ml  Output 650 ml  Net -407 ml   Filed Weights   01/10/21 0826 01/10/21 1249 01/10/21 1717  Weight: 86.2 kg 86.2 kg 87.4 kg    Telemetry    SR with occasional PVCs - Personally Reviewed  ECG    No new tracings - Personally  Reviewed  Physical Exam   GEN: No acute distress.   Neck: No JVD. Cardiac: RRR, I/VI systolic murmur, no rubs, or gallops.  Respiratory: Clear to auscultation bilaterally.  GI: Soft, nontender, non-distended.   MS: No edema; No deformity. Neuro:  Alert and oriented x 3; Nonfocal.  Psych: Normal affect.  Labs    Chemistry Recent Labs  Lab 01/10/21 0841 01/11/21 0702 01/12/21 0537  NA 137 136 134*  K 4.1 4.1 3.8  CL 102 99 99  CO2 25 28 25   GLUCOSE 190* 148* 140*  BUN 16 16 20   CREATININE 0.93 0.96 0.92  CALCIUM 9.3 9.3 9.2  GFRNONAA >60 >60 >60  ANIONGAP 10 9 10      Hematology Recent Labs  Lab 01/10/21 0841  WBC 10.2  RBC 4.92  HGB 14.5  HCT 44.5  MCV 90.4  MCH 29.5  MCHC 32.6  RDW 14.2  PLT 249    Cardiac EnzymesNo results for input(s): TROPONINI in the last 168 hours. No results for input(s): TROPIPOC in the last 168 hours.   BNPNo results for input(s): BNP, PROBNP in the last 168 hours.   DDimer No results for input(s): DDIMER in the  last 168 hours.   Radiology     Cardiac Studies   Limited echo 01/11/2021: 1. Left ventricular ejection fraction, by estimation, is 30 to 35%. The  left ventricle has moderately decreased function. Left ventricular  endocardial border not optimally defined to evaluate regional wall motion.   2. Right ventricular systolic function is normal.   3. Mild mitral valve regurgitation.   4. The aortic valve is grossly normal. Aortic valve regurgitation is not  visualized.   5. The inferior vena cava is normal in size with greater than 50%  respiratory variability, suggesting right atrial pressure of 3 mmHg.  __________  LHC 01/10/2021: Conclusions: Severe multivessel coronary artery disease with chronic total occlusions of the ostial/proximal LAD and mid RCA as well as moderate-severe disease involving D1, ramus intermedius, and LCx/OM branches. Small but patent LIMA-LAD with 99% stenosis at distal anastomosis and TIMI I  flow into relatively small mid/distal LAD. Widely patent SVG-PDA.  PDA does not backfill the PL branches, which are chronically occluded and fill via left-right collaterals. Severely elevated left ventricular filling pressure (LVEDP 40 mmHg). Bioprosthetic aortic valve with mild gradient (peak-to-peak gradient 10-15 mmHg).   Recommendations: Diuresis and escalate antianginal therapy.  Will discontinue IV nitroglycerin and add isosorbide mononitrate 30 mg daily. Consider PCI distal LIMA-LAD anastomosis once volume status is optimized (inpatient versus outpatient to be determined by symptoms and hospital course), though intervention will be challenging given tortuosity/angulation of the involved vessels as well as relatively small size (approximately 2 mm in diameter). Loaded with clopidogrel with plans for dual antiplatelet therapy with aspirin and clopidogrel for 12 months. Aggressive secondary prevention of coronary artery disease and continued goal-directed medical therapy of acute on chronic HFrEF due to ischemic cardiomyopathy. __________  2D echo 11/28/2020: 1. Left ventricular ejection fraction, by estimation, is 30 to 35%. The  left ventricle has moderately decreased function. The left ventricle  demonstrates regional wall motion abnormalities (see scoring  diagram/findings for description). Left ventricular   diastolic parameters are consistent with Grade I diastolic dysfunction  (impaired relaxation). There is akinesis of the left ventricular,  mid-apical anteroseptal wall, anterior wall and apical segment.   2. Right ventricular systolic function is normal. The right ventricular  size is normal. There is mildly elevated pulmonary artery systolic  pressure.   3. Left atrial size was mildly dilated.   4. The mitral valve is normal in structure. No evidence of mitral valve  regurgitation. No evidence of mitral stenosis.   5. The aortic valve has been repaired/replaced. Aortic valve   regurgitation is not visualized. No aortic stenosis is present. There is a  bovine valve present in the aortic position. Echo findings are consistent  with normal structure and function of the  aortic valve prosthesis. Aortic valve mean gradient measures 10.0 mmHg.   6. The inferior vena cava is dilated in size with >50% respiratory  variability, suggesting right atrial pressure of 8 mmHg.   Comparison(s): Previous Echo at [facility] showed LV EF 35%, Grade I  diastolic dysfunction, mid anteroseptal wall hypokinesis, mid  inferoseptal, apical septal and apical anterior wall akinesis. NO AI or  AS. Bovine bioprosthetic AoV functioning normally.   Patient Profile     75 y.o. male with history of coronary artery disease with remote MI and subsequent CABG (2018 at [facility]; LIMA-LAD and SVG-PDA), chronic HFrEF due to ischemic cardiomyopathy complicated by ventricular tachycardia status post ICD, aortic stenosis status post bioprosthetic AVR (2018 at [facility]; 25 mm Oletta Lamas  Magna) hypertension, hyperlipidemia, type 2 diabetes mellitus, and obstructive sleep apnea, who is being seen 01/10/2021 for the evaluation of chest pain and elevated troponin at the request of Dr. Corky Downs.  Assessment & Plan    1. CAD s/p CABG with NSTEMI: -Chest pain free -LHC this admission with LIMA-LAD stenosis at anastomosis site, along with small vessel disease and, significant tortuosity/angulation as outlined above with recommendation for medical therapy with intervention of the LIMA to LAD anastomosis site reserved for unstable symptoms or decompensation despite optimization of medical therapy  -ASA and Plavix -If BP allows, up titrate Imdur as indicated  -Crestor, Zetia, Toprol -Post cath instructions -Cardiac rehab  2. HFrEF secondary to ICM s/p ICD: -Appears euvolemic and well compensated  -Stop IV Lasix -Start oral Lasix 40 mg daily tomorrow, will need follow up labs in the outpatient setting -Entresto, Toprol,  spironolactone -Restart PTA Farxiga -CHF education   3. Aortic stenosis s/p bioprosthetic AVR: -Normal functioning AVR on echo this admission -Outpatient follow up -SBE PPX  4. HTN: -Blood pressure currently reasonably controlled -Reports a history of positional dizziness -Medications as outlined above -Check orthostatic vitals  5. HLD: -LDL 58 in 10/2020 -Crestor and Zetia  6. History of VT: -No evidence of recurrent ventricular sustained arrhythmia on tele -Status post ICD -Toprol as above -Followed by EP  For questions or updates, please contact [place] Please consult [email or web address] for contact info under Cardiology/STEMI.    Signed, Christell Faith, PA-C [place] Pager: [phone] 01/12/2021, 11:28 AM

## 2021-01-12 NOTE — Evaluation (Signed)
Physical Therapy Evaluation Patient Details Name: Fransisco Messmer MRN: [id number] DOB: 03/18/1945 Today's Date: 01/12/2021  History of Present Illness  Danarius Mcconathy is a 21yoM who comes to [facility] On 11/1 c CP. PMH: DM2, CAD s/p MI/CABG, HLD, HTN, AS s/p bovine aortic valve, OSA. Pt admitted wth NSTEMI, started on heparin and nitro. Pt is now s/p cardiac cath on 11/1.  Clinical Impression  Pt admitted with above diagnosis. Pt currently with functional limitations due to the deficits listed below (see "PT Problem List"). Upon entry, pt in bed, awake and agreeable to participate. The pt is alert, pleasant, interactive, and able to provide info regarding prior level of function, both in tolerance and independence. Patient's performance this date reveals baseline ability, independence, and tolerance in performing all basic mobility required for performance of activities of daily living. Pt requires no additional DME, no physical assistance for safe participation in mobility. All education completed, and time is given to address all questions/concerns. No additional skilled PT services needed at this time, PT signing off. PT recommends daily ambulation ad lib or with nursing staff as needed to prevent deconditioning.         Recommendations for follow up therapy are one component of a multi-disciplinary discharge planning process, led by the attending physician.  Recommendations may be updated based on patient status, additional functional criteria and insurance authorization.  Follow Up Recommendations No PT follow up    Assistance Recommended at Discharge PRN  Functional Status Assessment Patient has had a recent decline in their functional status and demonstrates the ability to make significant improvements in function in a reasonable and predictable amount of time.  Equipment Recommendations  None recommended by PT    Recommendations for Other Services       Precautions / Restrictions  Precautions Precautions: None      Mobility  Bed Mobility Overal bed mobility: Independent                  Transfers Overall transfer level: Independent                      Ambulation/Gait Ambulation/Gait assistance: Independent Gait Distance (Feet): 200 Feet Assistive device: None Gait Pattern/deviations: WFL(Within Functional Limits)     General Gait Details: denies CP, SOB, dizziness.  Stairs Stairs: Yes Stairs assistance: Supervision;Min guard Stair Management: One rail Right;One rail Left Number of Stairs: 12 General stair comments: 1 episode of buckling per baseline 2/2 advanced knee DJD  Wheelchair Mobility    Modified Rankin (Stroke Patients Only)       Balance Overall balance assessment: Independent                                           Pertinent Vitals/Pain Pain Assessment: No/denies pain    Home Living Family/patient expects to be discharged to:: Private residence Living Arrangements: Spouse/significant other Available Help at Discharge: Family Type of Home: House       Alternate Level Stairs-Number of Steps: 1 Home Layout: Multi-level;Able to live on main level with bedroom/bathroom Home Equipment: Rolling Walker (2 wheels);Cane - single point      Prior Function Prior Level of Function : Independent/Modified Independent             Mobility Comments: mod I ADLs Comments: mod I     Hand Dominance  Extremity/Trunk Assessment                Communication      Cognition                                                General Comments      Exercises     Assessment/Plan    PT Assessment Patient does not need any further PT services  PT Problem List         PT Treatment Interventions      PT Goals (Current goals can be found in the Care Plan section)  Acute Rehab PT Goals PT Goal Formulation: All assessment and education complete, DC  therapy    Frequency     Barriers to discharge        Co-evaluation               AM-PAC PT "6 Clicks" Mobility  Outcome Measure Help needed turning from your back to your side while in a flat bed without using bedrails?: None Help needed moving from lying on your back to sitting on the side of a flat bed without using bedrails?: None Help needed moving to and from a bed to a chair (including a wheelchair)?: None Help needed standing up from a chair using your arms (e.g., wheelchair or bedside chair)?: None Help needed to walk in hospital room?: None Help needed climbing 3-5 steps with a railing? : A Little 6 Click Score: 23    End of Session   Activity Tolerance: Patient tolerated treatment well;No increased pain Patient left: in bed;with family/visitor present;with call bell/phone within reach Nurse Communication: Mobility status PT Visit Diagnosis: Other abnormalities of gait and mobility (R26.89)    Time: 6644-0347 PT Time Calculation (min) (ACUTE ONLY): 18 min   Charges:   PT Evaluation $PT Eval Moderate Complexity: 1 Mod PT Treatments $Therapeutic Exercise: 8-22 mins       1:47 PM, 01/12/21 Etta Grandchild, PT, DPT Physical Therapist - [facility]  [phone] ([place])    Rynn Markiewicz C 01/12/2021, 1:45 PM

## 2021-01-12 NOTE — Progress Notes (Signed)
Pt ambulated around nursing station / tolerated well/ no c/o chest pain or sob / MD aware

## 2021-01-12 NOTE — Progress Notes (Signed)
PROGRESS NOTE    Samuel Morris  DPO:[id number] DOB: 04-23-1945 DOA: 01/10/2021 PCP: Valerie Roys, DO   Brief Narrative:  This 75 y.o. male with medical history significant for type 2 diabetes mellitus, CAD with previous MI s/p CABG, hyperlipidemia, hypertension, aortic stenosis s/p bovine aortic valve, OSA, who presented to the hospital with chest pain.  Chest pain was severe (graded as 9/10 in severity).  It started last night but it went away and came back this morning while he was getting his grandson ready for school.  It radiated to his left arm.  There was no known relieving or aggravating factors.  Pain resolved with IV nitroglycerin in the ED. Troponin were elevated at 82.  He was started on IV heparin and IV nitroglycerin infusion. Cardiology consulted,  Patient underwent left heart cath found to have a multivessel disease. Patient currently remains asymptomatic.  Continue cardioprotective medications.  Assessment & Plan:   Active Problems:   NSTEMI (non-ST elevated myocardial infarction) ([place])   NSTEMI / CAD s/p CABG: Patient presented with typical chest pain and elevated troponins. Patient was started on IV heparin and IV nitroglycerin. Cardiology consulted,  Patient underwent left heart cath.  Found to have multivessel disease. Continue cardioprotective medications, aspirin, Plavix, Crestor, Toprol XL, Imdur, nitroglycerin Patient is currently remains asymptomatic.  PT and OT eval. If patient remains asymptomatic while ambulation,  patient can be discharged tomorrow. If he becomes symptomatic having chest pain patient may need PCI intervention to the occluded vessel. If blood pressure allows uptitrate the Imdur as indicated.   Chronic systolic CHF / Ischemic cardiomyopathy/ Status post AICD: S/p bovine aortic valve replacement. Patient appears euvolemic, well compensated.   Continue Entresto, Toprol and Aldactone. Discontinue IV Lasix, changed to oral Lasix 40 mg daily  tomorrow. Restart [facility] [place], CHF education.  Essential hypertension: Continue home medications.  Type 2 diabetes: Continue regular insulin sliding scale  Hyperlipidemia: Continue Crestor and Zetia  DVT prophylaxis: Heparin. Code Status: Full code Family Communication: No family at bedside Disposition Plan:   Status is: Inpatient  Remains inpatient appropriate because: Admitted for NSTEMI, underwent left heart cath found to have multivessel disease.  Anticipated discharge home  11/4.  Consultants:  Cardiology  Procedures: Left heart cath Antimicrobials:   Anti-infectives (From admission, onward)    Start     Dose/Rate Route Frequency Ordered Stop   01/10/21 1300  hydroxychloroquine (PLAQUENIL) tablet 200 mg        200 mg Oral 2 times daily 01/10/21 1155         Subjective: Patient was seen and examined at bedside.  Overnight events noted.   Patient reports feeling better, he denies any chest pain, shortness of breath, dizziness.  Objective: Vitals:   01/12/21 0359 01/12/21 0738 01/12/21 1227 01/12/21 1229  BP: 121/66 (!) 144/68 110/72 102/64  Pulse: 89 87 84 96  Resp: 20     Temp: 98.2 F (36.8 C)     TempSrc:      SpO2: 94% 97% 96% 95%  Weight:      Height:        Intake/Output Summary (Last 24 hours) at 01/12/2021 1343 Last data filed at 01/12/2021 0950 Gross per 24 hour  Intake 243 ml  Output 500 ml  Net -257 ml   Filed Weights   01/10/21 0826 01/10/21 1249 01/10/21 1717  Weight: 86.2 kg 86.2 kg 87.4 kg    Examination:  General exam: Appears comfortable, not in any acute distress. Respiratory system: Clear  to auscultation bilaterally. Respiratory effort normal.  RR 15 Cardiovascular system: S1 & S2 heard, regular rate and rhythm, murmur +. gastrointestinal system: Abdomen is soft, nontender, nondistended, BS+ Central nervous system: Alert and oriented  X3 . No focal neurological deficits. Extremities: No edema, no cyanosis, no clubbing. Skin: No  rashes, lesions or ulcers Psychiatry: Judgement and insight appear normal. Mood & affect appropriate.     Data Reviewed: I have personally reviewed following labs and imaging studies  CBC: Recent Labs  Lab 01/10/21 0841  WBC 10.2  HGB 14.5  HCT 44.5  MCV 90.4  PLT 086   Basic Metabolic Panel: Recent Labs  Lab 01/10/21 0841 01/11/21 0702 01/12/21 0537  NA 137 136 134*  K 4.1 4.1 3.8  CL 102 99 99  CO2 25 28 25   GLUCOSE 190* 148* 140*  BUN 16 16 20   CREATININE 0.93 0.96 0.92  CALCIUM 9.3 9.3 9.2   GFR: Estimated Creatinine Clearance: 72.1 mL/min (by C-G formula based on SCr of 0.92 mg/dL). Liver Function Tests: No results for input(s): AST, ALT, ALKPHOS, BILITOT, PROT, ALBUMIN in the last 168 hours. No results for input(s): LIPASE, AMYLASE in the last 168 hours. No results for input(s): AMMONIA in the last 168 hours. Coagulation Profile: Recent Labs  Lab 01/10/21 0959  INR 1.0   Cardiac Enzymes: No results for input(s): CKTOTAL, CKMB, CKMBINDEX, TROPONINI in the last 168 hours. BNP (last 3 results) No results for input(s): PROBNP in the last 8760 hours. HbA1C: Recent Labs    01/12/21 0537  HGBA1C 6.8*   CBG: Recent Labs  Lab 01/11/21 1132 01/11/21 1640 01/11/21 2014 01/12/21 0738 01/12/21 1203  GLUCAP 169* 134* 158* 157* 124*   Lipid Profile: No results for input(s): CHOL, HDL, LDLCALC, TRIG, CHOLHDL, LDLDIRECT in the last 72 hours. Thyroid Function Tests: No results for input(s): TSH, T4TOTAL, FREET4, T3FREE, THYROIDAB in the last 72 hours. Anemia Panel: No results for input(s): VITAMINB12, FOLATE, FERRITIN, TIBC, IRON, RETICCTPCT in the last 72 hours. Sepsis Labs: No results for input(s): PROCALCITON, LATICACIDVEN in the last 168 hours.  Recent Results (from the past 240 hour(s))  Resp Panel by RT-PCR (Flu A&B, Covid) Nasopharyngeal Swab     Status: None   Collection Time: 01/10/21  9:59 AM   Specimen: Nasopharyngeal Swab; Nasopharyngeal(NP)  swabs in vial transport medium  Result Value Ref Range Status   SARS Coronavirus 2 by RT PCR NEGATIVE NEGATIVE Final    Comment: (NOTE) SARS-CoV-2 target nucleic acids are NOT DETECTED.  The SARS-CoV-2 RNA is generally detectable in upper respiratory specimens during the acute phase of infection. The lowest concentration of SARS-CoV-2 viral copies this assay can detect is 138 copies/mL. A negative result does not preclude SARS-Cov-2 infection and should not be used as the sole basis for treatment or other patient management decisions. A negative result may occur with  improper specimen collection/handling, submission of specimen other than nasopharyngeal swab, presence of viral mutation(s) within the areas targeted by this assay, and inadequate number of viral copies(<138 copies/mL). A negative result must be combined with clinical observations, patient history, and epidemiological information. The expected result is Negative.  Fact Sheet for Patients:  EntrepreneurPulse.com.au  Fact Sheet for Healthcare Providers:  IncredibleEmployment.be  This test is no t yet approved or cleared by the [place] FDA and  has been authorized for detection and/or diagnosis of SARS-CoV-2 by FDA under an Emergency Use Authorization (EUA). This EUA will remain  in effect (meaning this test can  be used) for the duration of the COVID-19 declaration under Section 564(b)(1) of the Act, 21 U.S.C.section 360bbb-3(b)(1), unless the authorization is terminated  or revoked sooner.       Influenza A by PCR NEGATIVE NEGATIVE Final   Influenza B by PCR NEGATIVE NEGATIVE Final    Comment: (NOTE) The Xpert Xpress SARS-CoV-2/FLU/RSV plus assay is intended as an aid in the diagnosis of influenza from Nasopharyngeal swab specimens and should not be used as a sole basis for treatment. Nasal washings and aspirates are unacceptable for Xpert Xpress  SARS-CoV-2/FLU/RSV testing.  Fact Sheet for Patients: EntrepreneurPulse.com.au  Fact Sheet for Healthcare Providers: IncredibleEmployment.be  This test is not yet approved or cleared by the [place] FDA and has been authorized for detection and/or diagnosis of SARS-CoV-2 by FDA under an Emergency Use Authorization (EUA). This EUA will remain in effect (meaning this test can be used) for the duration of the COVID-19 declaration under Section 564(b)(1) of the Act, 21 U.S.C. section 360bbb-3(b)(1), unless the authorization is terminated or revoked.  Performed at [facility], [address], [place], [place] [address]     Radiology Studies: ECHOCARDIOGRAM LIMITED  Result Date: 01/11/2021    ECHOCARDIOGRAM LIMITED REPORT   Patient Name:   Samuel Morris Date of Exam: 01/11/2021 Medical Rec #:  [id number]   Height:       67.0 in Accession #:    [id number]  Weight:       192.6 lb Date of Birth:  [birth year]   BSA:          1.990 m Patient Age:    51 years    BP:           99/70 mmHg Patient Gender: M           HR:           94 bpm. Exam Location:  [facility] Procedure: Cardiac Doppler, Color Doppler and Limited Echo Indications:     Acute myocardial infarction -unspecified I21.9  History:         Patient has prior history of Echocardiogram examinations, most                  recent 11/28/2020. Risk Factors:Diabetes and Hypertension.  Sonographer:     Sherrie Sport Referring Phys:  [id number] CHRISTOPHER END Diagnosing Phys: Kate Sable MD IMPRESSIONS  1. Left ventricular ejection fraction, by estimation, is 30 to 35%. The left ventricle has moderately decreased function. Left ventricular endocardial border not optimally defined to evaluate regional wall motion.  2. Right ventricular systolic function is normal.  3. Mild mitral valve regurgitation.  4. The aortic valve is grossly normal. Aortic valve regurgitation is not visualized.  5. The inferior vena cava is  normal in size with greater than 50% respiratory variability, suggesting right atrial pressure of 3 mmHg. FINDINGS  Left Ventricle: Left ventricular ejection fraction, by estimation, is 30 to 35%. The left ventricle has moderately decreased function. Left ventricular endocardial border not optimally defined to evaluate regional wall motion. There is no left ventricular hypertrophy. Right Ventricle: Right ventricular systolic function is normal. Mitral Valve: Mild mitral valve regurgitation. Aortic Valve: The aortic valve is grossly normal. Aortic valve regurgitation is not visualized. Pulmonic Valve: The pulmonic valve was normal in structure. Pulmonic valve regurgitation is not visualized. Venous: The inferior vena cava is normal in size with greater than 50% respiratory variability, suggesting right atrial pressure of 3 mmHg. Additional Comments: A device lead is visualized. LEFT VENTRICLE  PLAX 2D LVIDd:         5.37 cm LVIDs:         4.50 cm LV PW:         0.94 cm LV IVS:        0.98 cm  LV Volumes (MOD) LV vol d, MOD A2C: 161.0 ml LV vol d, MOD A4C: 148.0 ml LV vol s, MOD A2C: 108.0 ml LV vol s, MOD A4C: 128.0 ml LV SV MOD A2C:     53.0 ml LV SV MOD A4C:     148.0 ml LV SV MOD BP:      34.9 ml LEFT ATRIUM         Index LA diam:    4.20 cm 2.11 cm/m                        PULMONIC VALVE AORTA                 PV Vmax:        0.98 m/s Ao Root diam: 3.50 cm PV Vmean:       75.200 cm/s                       PV VTI:         0.156 m                       PV Peak grad:   3.8 mmHg                       PV Mean grad:   2.5 mmHg                       RVOT Peak grad: 6 mmHg  TRICUSPID VALVE TR Peak grad:   16.6 mmHg TR Vmax:        204.00 cm/s  SHUNTS Pulmonic VTI: 0.154 m Kate Sable MD Electronically signed by Kate Sable MD Signature Date/Time: 01/11/2021/5:44:41 PM    Final     Scheduled Meds:  aspirin EC  81 mg Oral Daily   cholecalciferol  1,000 Units Oral Daily   clopidogrel  75 mg Oral Q breakfast    dapagliflozin propanediol  10 mg Oral Daily   enoxaparin (LOVENOX) injection  40 mg Subcutaneous Q24H   ezetimibe  10 mg Oral Daily   [START ON 01/13/2021] furosemide  40 mg Oral Daily   hydroxychloroquine  200 mg Oral BID   insulin aspart  0-9 Units Subcutaneous TID WC   isosorbide mononitrate  30 mg Oral Daily   metoprolol succinate  150 mg Oral Daily   rosuvastatin  20 mg Oral QHS   sacubitril-valsartan  1 tablet Oral BID   sodium chloride flush  3 mL Intravenous Q12H   spironolactone  25 mg Oral Daily   vitamin B-12  500 mcg Oral Daily   Continuous Infusions:  sodium chloride       LOS: 2 days    Time spent: 25 mins    Shawna Clamp, MD [facility]   If 7PM-7AM, please contact night-coverage

## 2021-01-13 ENCOUNTER — Ambulatory Visit (INDEPENDENT_AMBULATORY_CARE_PROVIDER_SITE_OTHER): Payer: Medicare Other

## 2021-01-13 ENCOUNTER — Telehealth: Payer: Self-pay | Admitting: Family Medicine

## 2021-01-13 DIAGNOSIS — I472 Ventricular tachycardia, unspecified: Secondary | ICD-10-CM

## 2021-01-13 LAB — GLUCOSE, CAPILLARY
Glucose-Capillary: 125 mg/dL — ABNORMAL HIGH (ref 70–99)
Glucose-Capillary: 120 mg/dL — ABNORMAL HIGH (ref 70–99)

## 2021-01-13 MED ORDER — DAPAGLIFLOZIN PROPANEDIOL 10 MG PO TABS: 10.0000 mg | ORAL_TABLET | Freq: Every day | ORAL | 1 refills | Status: DC

## 2021-01-13 MED ORDER — DAPAGLIFLOZIN PROPANEDIOL 10 MG PO TABS: 10.0000 mg | ORAL_TABLET | Freq: Every day | ORAL | 1 refills | Status: AC

## 2021-01-13 MED ORDER — ISOSORBIDE MONONITRATE ER 30 MG PO TB24: 30.0000 mg | ORAL_TABLET | Freq: Every day | ORAL | 1 refills | Status: DC

## 2021-01-13 MED ORDER — CLOPIDOGREL BISULFATE 75 MG PO TABS: 75.0000 mg | ORAL_TABLET | Freq: Every day | ORAL | 1 refills | Status: DC

## 2021-01-13 MED ORDER — FUROSEMIDE 40 MG PO TABS: 40.0000 mg | ORAL_TABLET | Freq: Every day | ORAL | 1 refills | Status: DC

## 2021-01-13 NOTE — Telephone Encounter (Signed)
Copied from [place] [phone]. Topic: General - Other >> Jan 13, 2021  4:00 PM Celene Kras wrote: Reason for CRM: Pt called and is requesting to speak with the pharmacist. He states that it is in regards to a new medication, farxiga, he was prescribed by his cardiologist. Pt states that due to price he is not able to get medication due to price and is requesting to have options.Please advise.

## 2021-01-13 NOTE — Care Management Important Message (Signed)
Important Message  Patient Details  Name: Samuel Morris MRN: [id number] Date of Birth: [birth year]   Medicare Important Message Given:  Yes     [name] 01/13/2021, 1:24 PM

## 2021-01-13 NOTE — Discharge Instructions (Signed)
Advised to follow-up with primary care physician in 1 week. Advised to follow-up with cardiology as scheduled. Patient's medication has been adjusted.

## 2021-01-13 NOTE — Progress Notes (Signed)
Discharge instructions explained to pt/ verbalized an understanding/ iv and tele removed/ will transport off unit via wheelchair.  

## 2021-01-13 NOTE — Discharge Summary (Signed)
Physician Discharge Summary  Samuel Morris EXH:[id number] DOB: 1945-08-25 DOA: 01/10/2021  PCP: Samuel Roys, DO  Admit date: 01/10/2021  Discharge date: 01/13/2021  Admitted From: Home.  Disposition:  Home  Recommendations for Outpatient Follow-up:  Follow up with PCP in 1-2 weeks. Please obtain BMP/CBC in one week. Advised to follow-up with cardiology as scheduled. Patient's medication has been adjusted.  Home Health: None Equipment/Devices: None  Discharge Condition: Stable CODE STATUS: Full code Diet recommendation: Heart Healthy   Brief Summary / Hospital Course: This 75 y.o. male with medical history significant for type 2 diabetes mellitus, CAD with previous MI s/p CABG, hyperlipidemia, hypertension, aortic stenosis s/p bovine aortic valve, OSA, who presented to the hospital with chest pain.  Chest pain was severe (graded as 9/10 in severity).  It started last night but it went away and came back this morning while he was getting his grandson ready for school.  It radiated to his left arm.  There was no known relieving or aggravating factors.  Pain resolved with IV nitroglycerin in the ED. Troponin were elevated at 82.  He was started on IV heparin and IV nitroglycerin infusion. Patient was admitted for acute chest pain, Patient started on heparin for NSTEMI. Cardiology consulted,  Patient underwent left heart cath found to have a multivessel disease.  Cardiologist recommended maximize medical therapy, recommended PCI or intervention if patient becomes symptomatic.  Patient currently remains asymptomatic.  Patient has ambulated with physical therapy,  denies any chest pain or shortness of breath.  Patient is cleared from cardiology to be discharged and follow-up outpatient.  Patient's home medications were adjusted.  Patient is being discharged home.  He was managed for below problems.  Discharge Diagnoses:  Active Problems:   NSTEMI (non-ST elevated myocardial infarction)  ([place])  NSTEMI / CAD s/p CABG: Patient presented with typical chest pain and elevated troponins. Patient was started on IV heparin and IV nitroglycerin. Cardiology consulted,  Patient underwent left heart cath.  Found to have multivessel disease. Continue cardioprotective medications, aspirin, Plavix, Crestor, Toprol XL, Imdur, nitroglycerin Patient is currently remains asymptomatic.  PT and OT eval. If patient remains asymptomatic while ambulation,  patient can be discharged. If he becomes symptomatic having chest pain patient may need PCI intervention to the occluded vessel. Patient remained asymptomatic,  cleared from cardiology to be discharged.   Chronic systolic CHF / Ischemic cardiomyopathy/ Status post AICD: S/p bovine aortic valve replacement. Patient appears euvolemic, well compensated.   Continue Entresto, Toprol and Aldactone. Discontinue IV Lasix, changed to oral Lasix 40 mg daily at discharge. Restart Wilder Glade, CHF education.   Essential hypertension: Continue home medications.   Type 2 diabetes: Continue regular insulin sliding scale   Hyperlipidemia: Continue Crestor and Zetia    Discharge Instructions  Discharge Instructions     Call MD for:  difficulty breathing, headache or visual disturbances   Complete by: As directed    Call MD for:  persistant dizziness or light-headedness   Complete by: As directed    Call MD for:  persistant nausea and vomiting   Complete by: As directed    Diet - low sodium heart healthy   Complete by: As directed    Diet - low sodium heart healthy   Complete by: As directed    Diet Carb Modified   Complete by: As directed    Discharge instructions   Complete by: As directed    Advised to follow-up with primary care physician in 1 week. Advised to  follow-up with cardiology as scheduled. Patient's medication has been adjusted.   Increase activity slowly   Complete by: As directed    Increase activity slowly   Complete by: As  directed       Allergies as of 01/13/2021   No Known Allergies      Medication List     STOP taking these medications    benzonatate 100 MG capsule Commonly known as: TESSALON   GLUCOSAMINE-CHONDROITIN PO   meloxicam 15 MG tablet Commonly known as: MOBIC   Paxlovid (300/100) 20 x 150 MG & 10 x 100MG  Tbpk Generic drug: nirmatrelvir & ritonavir   predniSONE 5 MG tablet Commonly known as: DELTASONE       TAKE these medications    albuterol 108 (90 Base) MCG/ACT inhaler Commonly known as: Ventolin HFA Inhale 1 puff into the lungs every 4 (four) hours as needed for wheezing or shortness of breath.   aspirin EC 81 MG tablet Take 81 mg by mouth daily.   Calcium-Magnesium 100-50 MG Tabs Take 1 tablet by mouth daily.   clopidogrel 75 MG tablet Commonly known as: PLAVIX Take 1 tablet (75 mg total) by mouth daily with breakfast. Start taking on: January 14, 2021   dapagliflozin propanediol 10 MG Tabs tablet Commonly known as: FARXIGA Take 1 tablet (10 mg total) by mouth daily. Start taking on: January 14, 2021 What changed: how much to take   diclofenac Sodium 1 % Gel Commonly known as: VOLTAREN SMARTSIG:4 Gram(s) Topical Twice Daily   Entresto 49-51 MG Generic drug: sacubitril-valsartan Take 1 tablet by mouth 2 (two) times daily.   ezetimibe 10 MG tablet Commonly known as: ZETIA Take 1 tablet (10 mg total) by mouth daily.   furosemide 40 MG tablet Commonly known as: LASIX Take 1 tablet (40 mg total) by mouth daily. Start taking on: January 14, 2021   hydroxychloroquine 200 MG tablet Commonly known as: PLAQUENIL Take 200 mg by mouth 2 (two) times daily.   isosorbide mononitrate 30 MG 24 hr tablet Commonly known as: IMDUR Take 1 tablet (30 mg total) by mouth daily. Start taking on: January 14, 2021   metoprolol succinate 100 MG 24 hr tablet Commonly known as: TOPROL-XL Take 1.5 tabs daily. Take with or immediately following a meal.    nitroGLYCERIN 0.4 MG SL tablet Commonly known as: NITROSTAT Place 1 tablet (0.4 mg total) under the tongue every 5 (five) minutes as needed for chest pain.   rosuvastatin 20 MG tablet Commonly known as: CRESTOR Take 1 tablet (20 mg total) by mouth at bedtime.   spironolactone 25 MG tablet Commonly known as: ALDACTONE Take 1 tablet (25 mg total) by mouth daily.   vitamin B-12 1000 MCG tablet Commonly known as: CYANOCOBALAMIN Take by mouth.   VITAMIN C PO Take 100 mg by mouth daily.   Vitamin D-1000 Max St 25 MCG (1000 UT) tablet Generic drug: Cholecalciferol Take 1,000 Units by mouth daily.        Follow-up Information     Samuel Roys, DO Follow up on 01/17/2021.   Specialty: Family Medicine Why: @ 11:20am Contact information: [place] [address] [phone]         Vickie Epley, MD Follow up on 02/01/2021.   Specialties: Cardiology, Radiology Why: @ 9:40am Contact information: [place] [address] [phone]         Minna Merritts, MD Follow up on 01/19/2021.   Specialty: Cardiology  Why: @ 11:20am Patient will see Dr. Karl Bales information: [place] [id number] [phone]                No Known Allergies  Consultations: Cardiology   Procedures/Studies: DG Chest 2 View  Result Date: 01/10/2021 CLINICAL DATA:  Chest pain EXAM: CHEST - 2 VIEW COMPARISON:  2018 FINDINGS: Chronic interstitial changes. No new consolidation or edema. No pleural effusion. Stable mild cardiomegaly. Valve replacement. Left chest wall ICD. No acute osseous abnormality. IMPRESSION: No acute process in the chest. Chronic interstitial changes and cardiomegaly. Electronically Signed   By: Macy Mis M.D.   On: 01/10/2021 09:00   CARDIAC CATHETERIZATION  Result Date: 01/10/2021 Conclusions: Severe multivessel coronary artery disease with chronic total occlusions of the  ostial/proximal LAD and mid RCA as well as moderate-severe disease involving D1, ramus intermedius, and LCx/OM branches. Small but patent LIMA-LAD with 99% stenosis at distal anastomosis and TIMI I flow into relatively small mid/distal LAD. Widely patent SVG-PDA.  PDA does not backfill the PL branches, which are chronically occluded and fill via left-right collaterals. Severely elevated left ventricular filling pressure (LVEDP 40 mmHg). Bioprosthetic aortic valve with mild gradient (peak-to-peak gradient 10-15 mmHg). Recommendations: Diuresis and escalate antianginal therapy.  Will discontinue IV nitroglycerin and add isosorbide mononitrate 30 mg daily. Consider PCI distal LIMA-LAD anastomosis once volume status is optimized (inpatient versus outpatient to be determined by symptoms and hospital course), though intervention will be challenging given tortuosity/angulation of the involved vessels as well as relatively small size (approximately 2 mm in diameter). Loaded with clopidogrel with plans for dual antiplatelet therapy with aspirin and clopidogrel for 12 months. Aggressive secondary prevention of coronary artery disease and continued goal-directed medical therapy of acute on chronic HFrEF due to ischemic cardiomyopathy. Nelva Bush, MD [facility]  ECHOCARDIOGRAM LIMITED  Result Date: 01/11/2021    ECHOCARDIOGRAM LIMITED REPORT   Patient Name:   Samuel Morris Date of Exam: 01/11/2021 Medical Rec #:  [id number]   Height:       67.0 in Accession #:    [id number]  Weight:       192.6 lb Date of Birth:  03/22/1945   BSA:          1.990 m Patient Age:    72 years    BP:           99/70 mmHg Patient Gender: M           HR:           94 bpm. Exam Location:  [facility] Procedure: Cardiac Doppler, Color Doppler and Limited Echo Indications:     Acute myocardial infarction -unspecified I21.9  History:         Patient has prior history of Echocardiogram examinations, most                  recent 11/28/2020. Risk  Factors:Diabetes and Hypertension.  Sonographer:     Sherrie Sport Referring Phys:  [id number] CHRISTOPHER END Diagnosing Phys: Kate Sable MD IMPRESSIONS  1. Left ventricular ejection fraction, by estimation, is 30 to 35%. The left ventricle has moderately decreased function. Left ventricular endocardial border not optimally defined to evaluate regional wall motion.  2. Right ventricular systolic function is normal.  3. Mild mitral valve regurgitation.  4. The aortic valve is grossly normal. Aortic valve regurgitation is not visualized.  5. The inferior vena cava is normal in size with greater than 50% respiratory variability, suggesting  right atrial pressure of 3 mmHg. FINDINGS  Left Ventricle: Left ventricular ejection fraction, by estimation, is 30 to 35%. The left ventricle has moderately decreased function. Left ventricular endocardial border not optimally defined to evaluate regional wall motion. There is no left ventricular hypertrophy. Right Ventricle: Right ventricular systolic function is normal. Mitral Valve: Mild mitral valve regurgitation. Aortic Valve: The aortic valve is grossly normal. Aortic valve regurgitation is not visualized. Pulmonic Valve: The pulmonic valve was normal in structure. Pulmonic valve regurgitation is not visualized. Venous: The inferior vena cava is normal in size with greater than 50% respiratory variability, suggesting right atrial pressure of 3 mmHg. Additional Comments: A device lead is visualized. LEFT VENTRICLE PLAX 2D LVIDd:         5.37 cm LVIDs:         4.50 cm LV PW:         0.94 cm LV IVS:        0.98 cm  LV Volumes (MOD) LV vol d, MOD A2C: 161.0 ml LV vol d, MOD A4C: 148.0 ml LV vol s, MOD A2C: 108.0 ml LV vol s, MOD A4C: 128.0 ml LV SV MOD A2C:     53.0 ml LV SV MOD A4C:     148.0 ml LV SV MOD BP:      34.9 ml LEFT ATRIUM         Index LA diam:    4.20 cm 2.11 cm/m                        PULMONIC VALVE AORTA                 PV Vmax:        0.98 m/s Ao Root diam: 3.50  cm PV Vmean:       75.200 cm/s                       PV VTI:         0.156 m                       PV Peak grad:   3.8 mmHg                       PV Mean grad:   2.5 mmHg                       RVOT Peak grad: 6 mmHg  TRICUSPID VALVE TR Peak grad:   16.6 mmHg TR Vmax:        204.00 cm/s  SHUNTS Pulmonic VTI: 0.154 m Kate Sable MD Electronically signed by Kate Sable MD Signature Date/Time: 01/11/2021/5:44:41 PM    Final     Left heart cath.   Subjective: Patient is seen and examined at bedside.  Overnight events noted.   Patient reports feeling much improved.  Patient has ambulated without having any symptoms.   Patient is being discharged home.  Discharge Exam: Vitals:   01/13/21 0747 01/13/21 1148  BP: 127/69 (!) 107/58  Pulse: 79 72  Resp: 17 17  Temp: 98.3 F (36.8 C) 98.4 F (36.9 C)  SpO2: 93% 96%   Vitals:   01/12/21 2325 01/13/21 0545 01/13/21 0747 01/13/21 1148  BP: 118/64 113/73 127/69 (!) 107/58  Pulse: 83  79 72  Resp: 18 16 17 17   Temp: 98.2 F (36.8 C) 98.2  F (36.8 C) 98.3 F (36.8 C) 98.4 F (36.9 C)  TempSrc:  Oral    SpO2: 95% 96% 93% 96%  Weight:      Height:        General: Pt is alert, awake, not in acute distress Cardiovascular: RRR, S1/S2 +, no rubs, no gallops Respiratory: CTA bilaterally, no wheezing, no rhonchi Abdominal: Soft, NT, ND, bowel sounds + Extremities: no edema, no cyanosis    The results of significant diagnostics from this hospitalization (including imaging, microbiology, ancillary and laboratory) are listed below for reference.     Microbiology: Recent Results (from the past 240 hour(s))  Resp Panel by RT-PCR (Flu A&B, Covid) Nasopharyngeal Swab     Status: None   Collection Time: 01/10/21  9:59 AM   Specimen: Nasopharyngeal Swab; Nasopharyngeal(NP) swabs in vial transport medium  Result Value Ref Range Status   SARS Coronavirus 2 by RT PCR NEGATIVE NEGATIVE Final    Comment: (NOTE) SARS-CoV-2 target nucleic  acids are NOT DETECTED.  The SARS-CoV-2 RNA is generally detectable in upper respiratory specimens during the acute phase of infection. The lowest concentration of SARS-CoV-2 viral copies this assay can detect is 138 copies/mL. A negative result does not preclude SARS-Cov-2 infection and should not be used as the sole basis for treatment or other patient management decisions. A negative result may occur with  improper specimen collection/handling, submission of specimen other than nasopharyngeal swab, presence of viral mutation(s) within the areas targeted by this assay, and inadequate number of viral copies(<138 copies/mL). A negative result must be combined with clinical observations, patient history, and epidemiological information. The expected result is Negative.  Fact Sheet for Patients:  EntrepreneurPulse.com.au  Fact Sheet for Healthcare Providers:  IncredibleEmployment.be  This test is no t yet approved or cleared by the [place] FDA and  has been authorized for detection and/or diagnosis of SARS-CoV-2 by FDA under an Emergency Use Authorization (EUA). This EUA will remain  in effect (meaning this test can be used) for the duration of the COVID-19 declaration under Section 564(b)(1) of the Act, 21 U.S.C.section 360bbb-3(b)(1), unless the authorization is terminated  or revoked sooner.       Influenza A by PCR NEGATIVE NEGATIVE Final   Influenza B by PCR NEGATIVE NEGATIVE Final    Comment: (NOTE) The Xpert Xpress SARS-CoV-2/FLU/RSV plus assay is intended as an aid in the diagnosis of influenza from Nasopharyngeal swab specimens and should not be used as a sole basis for treatment. Nasal washings and aspirates are unacceptable for Xpert Xpress SARS-CoV-2/FLU/RSV testing.  Fact Sheet for Patients: EntrepreneurPulse.com.au  Fact Sheet for Healthcare Providers: IncredibleEmployment.be  This  test is not yet approved or cleared by the [place] FDA and has been authorized for detection and/or diagnosis of SARS-CoV-2 by FDA under an Emergency Use Authorization (EUA). This EUA will remain in effect (meaning this test can be used) for the duration of the COVID-19 declaration under Section 564(b)(1) of the Act, 21 U.S.C. section 360bbb-3(b)(1), unless the authorization is terminated or revoked.  Performed at [facility], [address] [place], [place] [address]      Labs: BNP (last 3 results) No results for input(s): BNP in the last 8760 hours. Basic Metabolic Panel: Recent Labs  Lab 01/10/21 0841 01/11/21 0702 01/12/21 0537  NA 137 136 134*  K 4.1 4.1 3.8  CL 102 99 99  CO2 25 28 25   GLUCOSE 190* 148* 140*  BUN 16 16 20   CREATININE 0.93 0.96  0.92  CALCIUM 9.3 9.3 9.2   Liver Function Tests: No results for input(s): AST, ALT, ALKPHOS, BILITOT, PROT, ALBUMIN in the last 168 hours. No results for input(s): LIPASE, AMYLASE in the last 168 hours. No results for input(s): AMMONIA in the last 168 hours. CBC: Recent Labs  Lab 01/10/21 0841  WBC 10.2  HGB 14.5  HCT 44.5  MCV 90.4  PLT 249   Cardiac Enzymes: No results for input(s): CKTOTAL, CKMB, CKMBINDEX, TROPONINI in the last 168 hours. BNP: Invalid input(s): POCBNP CBG: Recent Labs  Lab 01/12/21 1203 01/12/21 1542 01/12/21 2049 01/13/21 0747 01/13/21 1147  GLUCAP 124* 138* 123* 125* 120*   D-Dimer No results for input(s): DDIMER in the last 72 hours. Hgb A1c Recent Labs    01/12/21 0537  HGBA1C 6.8*   Lipid Profile No results for input(s): CHOL, HDL, LDLCALC, TRIG, CHOLHDL, LDLDIRECT in the last 72 hours. Thyroid function studies No results for input(s): TSH, T4TOTAL, T3FREE, THYROIDAB in the last 72 hours.  Invalid input(s): FREET3 Anemia work up No results for input(s): VITAMINB12, FOLATE, FERRITIN, TIBC, IRON, RETICCTPCT in the last 72 hours. Urinalysis    Component  Value Date/Time   APPEARANCEUR Clear 09/10/2019 0845   GLUCOSEU 3+ (A) 09/10/2019 0845   BILIRUBINUR Negative 09/10/2019 0845   PROTEINUR Negative 09/10/2019 0845   NITRITE Negative 09/10/2019 0845   LEUKOCYTESUR Negative 09/10/2019 0845   Sepsis Labs Invalid input(s): PROCALCITONIN,  WBC,  LACTICIDVEN Microbiology Recent Results (from the past 240 hour(s))  Resp Panel by RT-PCR (Flu A&B, Covid) Nasopharyngeal Swab     Status: None   Collection Time: 01/10/21  9:59 AM   Specimen: Nasopharyngeal Swab; Nasopharyngeal(NP) swabs in vial transport medium  Result Value Ref Range Status   SARS Coronavirus 2 by RT PCR NEGATIVE NEGATIVE Final    Comment: (NOTE) SARS-CoV-2 target nucleic acids are NOT DETECTED.  The SARS-CoV-2 RNA is generally detectable in upper respiratory specimens during the acute phase of infection. The lowest concentration of SARS-CoV-2 viral copies this assay can detect is 138 copies/mL. A negative result does not preclude SARS-Cov-2 infection and should not be used as the sole basis for treatment or other patient management decisions. A negative result may occur with  improper specimen collection/handling, submission of specimen other than nasopharyngeal swab, presence of viral mutation(s) within the areas targeted by this assay, and inadequate number of viral copies(<138 copies/mL). A negative result must be combined with clinical observations, patient history, and epidemiological information. The expected result is Negative.  Fact Sheet for Patients:  EntrepreneurPulse.com.au  Fact Sheet for Healthcare Providers:  IncredibleEmployment.be  This test is no t yet approved or cleared by the [place] FDA and  has been authorized for detection and/or diagnosis of SARS-CoV-2 by FDA under an Emergency Use Authorization (EUA). This EUA will remain  in effect (meaning this test can be used) for the duration of the COVID-19  declaration under Section 564(b)(1) of the Act, 21 U.S.C.section 360bbb-3(b)(1), unless the authorization is terminated  or revoked sooner.       Influenza A by PCR NEGATIVE NEGATIVE Final   Influenza B by PCR NEGATIVE NEGATIVE Final    Comment: (NOTE) The Xpert Xpress SARS-CoV-2/FLU/RSV plus assay is intended as an aid in the diagnosis of influenza from Nasopharyngeal swab specimens and should not be used as a sole basis for treatment. Nasal washings and aspirates are unacceptable for Xpert Xpress SARS-CoV-2/FLU/RSV testing.  Fact Sheet for Patients: EntrepreneurPulse.com.au  Fact Sheet for Healthcare Providers:  IncredibleEmployment.be  This test is not yet approved or cleared by the [place] and has been authorized for detection and/or diagnosis of SARS-CoV-2 by FDA under an Emergency Use Authorization (EUA). This EUA will remain in effect (meaning this test can be used) for the duration of the COVID-19 declaration under Section 564(b)(1) of the Act, 21 U.S.C. section 360bbb-3(b)(1), unless the authorization is terminated or revoked.  Performed at [facility], [address], [place], [place] [address]      Time coordinating discharge: Over 30 minutes  SIGNED:   Shawna Clamp, MD  [facility] 01/13/2021, 3:20 PM Pager   If 7PM-7AM, please contact night-coverage

## 2021-01-15 LAB — CUP PACEART REMOTE DEVICE CHECK
Battery Remaining Longevity: 91 mo
Battery Voltage: 3 V
Brady Statistic AP VP Percent: 0.32 %
Brady Statistic AP VS Percent: 8.5 %
Brady Statistic AS VP Percent: 0.11 %
Brady Statistic AS VS Percent: 91.07 %
Brady Statistic RA Percent Paced: 8.7 %
Brady Statistic RV Percent Paced: 0.44 %
Date Time Interrogation Session: 20221105003425
HighPow Impedance: 81 Ohm
Implantable Lead Implant Date: 20121205
Implantable Lead Implant Date: 20121205
Implantable Lead Location: 753859
Implantable Lead Location: 753860
Implantable Lead Model: 5076
Implantable Pulse Generator Implant Date: 20190702
Lead Channel Impedance Value: 456 Ohm
Lead Channel Impedance Value: 494 Ohm
Lead Channel Impedance Value: 513 Ohm
Lead Channel Pacing Threshold Amplitude: 0.625 V
Lead Channel Pacing Threshold Amplitude: 0.75 V
Lead Channel Pacing Threshold Pulse Width: 0.4 ms
Lead Channel Pacing Threshold Pulse Width: 0.4 ms
Lead Channel Sensing Intrinsic Amplitude: 2.375 mV
Lead Channel Sensing Intrinsic Amplitude: 2.375 mV
Lead Channel Sensing Intrinsic Amplitude: 22.75 mV
Lead Channel Sensing Intrinsic Amplitude: 22.75 mV
Lead Channel Setting Pacing Amplitude: 1.5 V
Lead Channel Setting Pacing Amplitude: 2 V
Lead Channel Setting Pacing Pulse Width: 0.4 ms
Lead Channel Setting Sensing Sensitivity: 0.3 mV

## 2021-01-16 NOTE — Telephone Encounter (Signed)
With Dr. Wynetta Emery?

## 2021-01-17 ENCOUNTER — Encounter: Payer: Self-pay | Admitting: Family Medicine

## 2021-01-17 ENCOUNTER — Other Ambulatory Visit: Payer: Self-pay

## 2021-01-17 ENCOUNTER — Ambulatory Visit (INDEPENDENT_AMBULATORY_CARE_PROVIDER_SITE_OTHER): Payer: Medicare Other | Admitting: Family Medicine

## 2021-01-17 VITALS — BP 102/62 | HR 104 | Temp 98.2°F | Wt 189.2 lb

## 2021-01-17 DIAGNOSIS — I209 Angina pectoris, unspecified: Secondary | ICD-10-CM

## 2021-01-17 DIAGNOSIS — D692 Other nonthrombocytopenic purpura: Secondary | ICD-10-CM | POA: Diagnosis not present

## 2021-01-17 DIAGNOSIS — I214 Non-ST elevation (NSTEMI) myocardial infarction: Secondary | ICD-10-CM

## 2021-01-17 DIAGNOSIS — E1159 Type 2 diabetes mellitus with other circulatory complications: Secondary | ICD-10-CM | POA: Diagnosis not present

## 2021-01-17 DIAGNOSIS — E1169 Type 2 diabetes mellitus with other specified complication: Secondary | ICD-10-CM

## 2021-01-17 DIAGNOSIS — I25118 Atherosclerotic heart disease of native coronary artery with other forms of angina pectoris: Secondary | ICD-10-CM

## 2021-01-17 DIAGNOSIS — E785 Hyperlipidemia, unspecified: Secondary | ICD-10-CM

## 2021-01-17 DIAGNOSIS — N393 Stress incontinence (female) (male): Secondary | ICD-10-CM

## 2021-01-17 LAB — URINALYSIS, ROUTINE W REFLEX MICROSCOPIC
Bilirubin, UA: NEGATIVE
Ketones, UA: NEGATIVE
Leukocytes,UA: NEGATIVE
Nitrite, UA: NEGATIVE
Protein,UA: NEGATIVE
RBC, UA: NEGATIVE
Specific Gravity, UA: 1.01 (ref 1.005–1.030)
Urobilinogen, Ur: 0.2 mg/dL (ref 0.2–1.0)
pH, UA: 5 (ref 5.0–7.5)

## 2021-01-17 NOTE — Progress Notes (Signed)
BP 102/62   Pulse (!) 104   Temp 98.2 F (36.8 C)   Wt 189 lb 3.2 oz (85.8 kg)   SpO2 96%   BMI 29.63 kg/m    Subjective:    Patient ID: Samuel Morris, male    DOB: [birth year], 75 y.o.   MRN: [id number]  HPI: Samuel Morris is a 75 y.o. male  Chief Complaint  Patient presents with   Hospitalization Follow-up   Transition of [facility] Follow up.   Hospital/Facility: [facility] D/C Physician: Dr. Dwyane Dee D/C Date: 01/13/21  Records Requested: 01/17/21 Records Received: 01/17/21 Records Reviewed: 01/17/21  Diagnoses on Discharge: NSTEMI  Date of interactive Contact within 48 hours of discharge: 01/17/21- direct  Date of 7 day or 14 day face-to-face visit:  01/17/21  within 7 days  Outpatient Encounter Medications as of 01/17/2021  Medication Sig   albuterol (VENTOLIN HFA) 108 (90 Base) MCG/ACT inhaler Inhale 1 puff into the lungs every 4 (four) hours as needed for wheezing or shortness of breath.   Ascorbic Acid (VITAMIN C PO) Take 100 mg by mouth daily.   aspirin EC 81 MG tablet Take 81 mg by mouth daily.   Calcium-Magnesium 100-50 MG TABS Take 1 tablet by mouth daily.   Cholecalciferol (VITAMIN D-1000 MAX ST) 25 MCG (1000 UT) tablet Take 1,000 Units by mouth daily.   dapagliflozin propanediol (FARXIGA) 10 MG TABS tablet Take 1 tablet (10 mg total) by mouth daily.   diclofenac Sodium (VOLTAREN) 1 % GEL SMARTSIG:4 Gram(s) Topical Twice Daily   ENTRESTO 49-51 MG Take 1 tablet by mouth 2 (two) times daily.   ezetimibe (ZETIA) 10 MG tablet Take 1 tablet (10 mg total) by mouth daily.   furosemide (LASIX) 40 MG tablet Take 1 tablet (40 mg total) by mouth daily.   hydroxychloroquine (PLAQUENIL) 200 MG tablet Take 200 mg by mouth 2 (two) times daily.   nitroGLYCERIN (NITROSTAT) 0.4 MG SL tablet Place 1 tablet (0.4 mg total) under the tongue every 5 (five) minutes as needed for chest pain.   rosuvastatin (CRESTOR) 20 MG tablet Take 1 tablet (20 mg total) by mouth at bedtime.    spironolactone (ALDACTONE) 25 MG tablet Take 1 tablet (25 mg total) by mouth daily.   vitamin B-12 (CYANOCOBALAMIN) 1000 MCG tablet Take by mouth daily.   [DISCONTINUED] clopidogrel (PLAVIX) 75 MG tablet Take 1 tablet (75 mg total) by mouth daily with breakfast.   [DISCONTINUED] isosorbide mononitrate (IMDUR) 30 MG 24 hr tablet Take 1 tablet (30 mg total) by mouth daily.   [DISCONTINUED] metoprolol succinate (TOPROL-XL) 100 MG 24 hr tablet Take 1.5 tabs daily. Take with or immediately following a meal. (Patient taking differently: Take 100 mg by mouth daily.)   No facility-administered encounter medications on file as of 01/17/2021.  Per Hospitalist: "This 75 y.o. male with medical history significant for type 2 diabetes mellitus, CAD with previous MI s/p CABG, hyperlipidemia, hypertension, aortic stenosis s/p bovine aortic valve, OSA, who presented to the hospital with chest pain.  Chest pain was severe (graded as 9/10 in severity).  It started last night but it went away and came back this morning while he was getting his grandson ready for school.  It radiated to his left arm.  There was no known relieving or aggravating factors.  Pain resolved with IV nitroglycerin in the ED. Troponin were elevated at 82.  He was started on IV heparin and IV nitroglycerin infusion. Patient was admitted for acute chest pain, Patient started  on heparin for NSTEMI. Cardiology consulted,  Patient underwent left heart cath found to have a multivessel disease.  Cardiologist recommended maximize medical therapy, recommended PCI or intervention if patient becomes symptomatic.  Patient currently remains asymptomatic.  Patient has ambulated with physical therapy,  denies any chest pain or shortness of breath.  Patient is cleared from cardiology to be discharged and follow-up outpatient.  Patient's home medications were adjusted.  Patient is being discharged home.   He was managed for below problems.   Discharge Diagnoses:   Active Problems:   NSTEMI (non-ST elevated myocardial infarction) ([place])   NSTEMI / CAD s/p CABG: Patient presented with typical chest pain and elevated troponins. Patient was started on IV heparin and IV nitroglycerin. Cardiology consulted,  Patient underwent left heart cath.  Found to have multivessel disease. Continue cardioprotective medications, aspirin, Plavix, Crestor, Toprol XL, Imdur, nitroglycerin Patient is currently remains asymptomatic.  PT and OT eval. If patient remains asymptomatic while ambulation,  patient can be discharged. If he becomes symptomatic having chest pain patient may need PCI intervention to the occluded vessel. Patient remained asymptomatic,  cleared from cardiology to be discharged.   Chronic systolic CHF / Ischemic cardiomyopathy/ Status post AICD: S/p bovine aortic valve replacement. Patient appears euvolemic, well compensated.   Continue Entresto, Toprol and Aldactone. Discontinue IV Lasix, changed to oral Lasix 40 mg daily at discharge. Restart [facility] [place]ade, CHF education.   Essential hypertension: Continue home medications.   Type 2 diabetes: Continue regular insulin sliding scale   Hyperlipidemia: Continue Crestor and Zetia"  Diagnostic Tests Reviewed CLINICAL DATA:  Chest pain   EXAM: CHEST - 2 VIEW   COMPARISON:  2018   FINDINGS: Chronic interstitial changes. No new consolidation or edema. No pleural effusion. Stable mild cardiomegaly. Valve replacement. Left chest wall ICD. No acute osseous abnormality.   IMPRESSION: No acute process in the chest. Chronic interstitial changes and cardiomegaly.  Conclusions: Severe multivessel coronary artery disease with chronic total occlusions of the ostial/proximal LAD and mid RCA as well as moderate-severe disease involving D1, ramus intermedius, and LCx/OM branches. Small but patent LIMA-LAD with 99% stenosis at distal anastomosis and TIMI I flow into relatively small mid/distal LAD. Widely  patent SVG-PDA.  PDA does not backfill the PL branches, which are chronically occluded and fill via left-right collaterals. Severely elevated left ventricular filling pressure (LVEDP 40 mmHg). Bioprosthetic aortic valve with mild gradient (peak-to-peak gradient 10-15 mmHg).   Recommendations: Diuresis and escalate antianginal therapy.  Will discontinue IV nitroglycerin and add isosorbide mononitrate 30 mg daily. Consider PCI distal LIMA-LAD anastomosis once volume status is optimized (inpatient versus outpatient to be determined by symptoms and hospital course), though intervention will be challenging given tortuosity/angulation of the involved vessels as well as relatively small size (approximately 2 mm in diameter). Loaded with clopidogrel with plans for dual antiplatelet therapy with aspirin and clopidogrel for 12 months. Aggressive secondary prevention of coronary artery disease and continued goal-directed medical therapy of acute on chronic HFrEF due to ischemic cardiomyopathy.  ECHO- IMPRESSIONS Left ventricular ejection fraction, by estimation, is 30 to 35%. The left ventricle has moderately decreased function. Left ventricular endocardial border not optimally defined to evaluate regional wall motion. 1. 2. Right ventricular systolic function is normal. 3. Mild mitral valve regurgitation. 4. The aortic valve is grossly normal. Aortic valve regurgitation is not visualized. The inferior vena cava is normal in size with greater than 50% respiratory variability, suggesting right atrial pressure of 3 mmHg.  Disposition: Home  Consults:  Cardiology  Discharge Instructions: Follow up with PCP in 1-2 weeks. Please obtain BMP/CBC in one week. Advised to follow-up with cardiology as scheduled. Patient's medication has been adjusted.  Disease/illness Education: Discussed today  Home Health/Community Services Discussions/Referrals: N/A  Establishment or re-establishment of referral orders  for community resources: N/A  Discussion with other health care providers: N/A  Assessment and Support of treatment regimen adherence: Excellent  Appointments Coordinated with: Patient  Education for self-management, independent living, and ADLs: Discussed today.  Since getting out of the hospital, Samuel Morris has been feeling well, but tired. He is scheduled to see Dr. Saunders Revel in 2 days. He has not been able to get his farxiga due to cost. He has reached out to the [facility] pharmacist to get help with the cost. He has not been able to pick it up yet, but he has had some at home because he had some from a previous endocrinologist, so he has been taking it. He has been tolerating it well. They are planning on continuing his medical therapy unless he becomes asymptomatic, in which case they would do a PCI. He is going to be following with them closely. He is a little concerned about this as he has really didn't have any symptoms with his previous MIs. He is otherwise feeling OK. No other concerns or complaints at this time.   Relevant past medical, surgical, family and social history reviewed and updated as indicated. Interim medical history since our last visit reviewed. Allergies and medications reviewed and updated.  Review of Systems  Constitutional: Negative.   Respiratory: Negative.    Cardiovascular: Negative.   Gastrointestinal: Negative.   Musculoskeletal: Negative.   Neurological: Negative.   Psychiatric/Behavioral: Negative.     Per HPI unless specifically indicated above     Objective:    BP 102/62   Pulse (!) 104   Temp 98.2 F (36.8 C)   Wt 189 lb 3.2 oz (85.8 kg)   SpO2 96%   BMI 29.63 kg/m   Wt Readings from Last 3 Encounters:  01/19/21 192 lb (87.1 kg)  01/17/21 189 lb 3.2 oz (85.8 kg)  01/10/21 192 lb 9.6 oz (87.4 kg)    Physical Exam Vitals and nursing note reviewed.  Constitutional:      General: He is not in acute distress.    Appearance: Normal appearance. He is  not ill-appearing, toxic-appearing or diaphoretic.  HENT:     Head: Normocephalic and atraumatic.     Right Ear: External ear normal.     Left Ear: External ear normal.     Nose: Nose normal.     Mouth/Throat:     Mouth: Mucous membranes are moist.     Pharynx: Oropharynx is clear.  Eyes:     General: No scleral icterus.       Right eye: No discharge.        Left eye: No discharge.     Extraocular Movements: Extraocular movements intact.     Conjunctiva/sclera: Conjunctivae normal.     Pupils: Pupils are equal, round, and reactive to light.  Cardiovascular:     Rate and Rhythm: Normal rate and regular rhythm.     Pulses: Normal pulses.     Heart sounds: Normal heart sounds. No murmur heard.   No friction rub. No gallop.  Pulmonary:     Effort: Pulmonary effort is normal. No respiratory distress.     Breath sounds: Normal breath sounds. No stridor. No wheezing, rhonchi or rales.  Chest:  Chest wall: No tenderness.  Musculoskeletal:        General: Normal range of motion.     Cervical back: Normal range of motion and neck supple.  Skin:    General: Skin is warm and dry.     Capillary Refill: Capillary refill takes less than 2 seconds.     Coloration: Skin is not jaundiced or pale.     Findings: No bruising, erythema, lesion or rash.  Neurological:     General: No focal deficit present.     Mental Status: He is alert and oriented to person, place, and time. Mental status is at baseline.  Psychiatric:        Mood and Affect: Mood normal.        Behavior: Behavior normal.        Thought Content: Thought content normal.        Judgment: Judgment normal.    Results for orders placed or performed in visit on 01/17/21  Comprehensive metabolic panel  Result Value Ref Range   Glucose 201 (H) 70 - 99 mg/dL   BUN 26 8 - 27 mg/dL   Creatinine, Ser 1.13 0.76 - 1.27 mg/dL   eGFR 67 >59 mL/min/1.73   BUN/Creatinine Ratio 23 10 - 24   Sodium 136 134 - 144 mmol/L   Potassium 4.5  3.5 - 5.2 mmol/L   Chloride 99 96 - 106 mmol/L   CO2 19 (L) 20 - 29 mmol/L   Calcium 9.4 8.6 - 10.2 mg/dL   Total Protein 7.0 6.0 - 8.5 g/dL   Albumin 4.4 3.7 - 4.7 g/dL   Globulin, Total 2.6 1.5 - 4.5 g/dL   Albumin/Globulin Ratio 1.7 1.2 - 2.2   Bilirubin Total 0.5 0.0 - 1.2 mg/dL   Alkaline Phosphatase 101 44 - 121 IU/L   AST 20 0 - 40 IU/L   ALT 28 0 - 44 IU/L  CBC with Differential/Platelet  Result Value Ref Range   WBC 13.6 (H) 3.4 - 10.8 x10E3/uL   RBC 5.10 4.14 - 5.80 x10E6/uL   Hemoglobin 14.6 13.0 - 17.7 g/dL   Hematocrit 44.6 37.5 - 51.0 %   MCV 88 79 - 97 fL   MCH 28.6 26.6 - 33.0 pg   MCHC 32.7 31.5 - 35.7 g/dL   RDW 13.0 11.6 - 15.4 %   Platelets 284 150 - 450 x10E3/uL   Neutrophils 80 Not Estab. %   Lymphs 9 Not Estab. %   Monocytes 10 Not Estab. %   Eos 1 Not Estab. %   Basos 0 Not Estab. %   Neutrophils Absolute 10.7 (H) 1.4 - 7.0 x10E3/uL   Lymphocytes Absolute 1.2 0.7 - 3.1 x10E3/uL   Monocytes Absolute 1.4 (H) 0.1 - 0.9 x10E3/uL   EOS (ABSOLUTE) 0.2 0.0 - 0.4 x10E3/uL   Basophils Absolute 0.1 0.0 - 0.2 x10E3/uL   Immature Granulocytes 0 Not Estab. %   Immature Grans (Abs) 0.0 0.0 - 0.1 x10E3/uL  Urinalysis, Routine w reflex microscopic  Result Value Ref Range   Specific Gravity, UA 1.010 1.005 - 1.030   pH, UA 5.0 5.0 - 7.5   Color, UA Yellow Yellow   Appearance Ur Clear Clear   Leukocytes,UA Negative Negative   Protein,UA Negative Negative/Trace   Glucose, UA 3+ (A) Negative   Ketones, UA Negative Negative   RBC, UA Negative Negative   Bilirubin, UA Negative Negative   Urobilinogen, Ur 0.2 0.2 - 1.0 mg/dL   Nitrite, UA Negative Negative  Assessment & Plan:   Problem List Items Addressed This Visit       Cardiovascular and Mediastinum   CAD (coronary artery disease)    Continue mediation management. Currently asymptomatic. To follow up with cardiology later this week. Continue to monitor.       Type 2 diabetes mellitus with cardiac  complication (HCC)    Stable with A1c of 6.8. Continue current regimen. Continue to monitor. Recheck 3 months.       Relevant Orders   Comprehensive metabolic panel (Completed)   CBC with Differential/Platelet (Completed)   Angina pectoris ([place])    Continue mediation management. Currently asymptomatic. To follow up with cardiology later this week. Continue to monitor.       Senile purpura ([place])    Reassured patient. Continue to monitor.       NSTEMI (non-ST elevated myocardial infarction) ([place]) - Primary    Continue mediation management. Currently asymptomatic. To follow up with cardiology later this week. Continue to monitor.       Relevant Orders   Comprehensive metabolic panel (Completed)   CBC with Differential/Platelet (Completed)     Endocrine   Hyperlipidemia associated with type 2 diabetes mellitus ([place])    Under good control on current regimen. Continue current regimen. Continue to monitor. Call with any concerns.       Relevant Orders   Comprehensive metabolic panel (Completed)   CBC with Differential/Platelet (Completed)   Other Visit Diagnoses     Stress incontinence       Would like to see urology. Referral generated today.   Relevant Orders   Urinalysis, Routine w reflex microscopic (Completed)   Ambulatory referral to Urology        Follow up plan: Return in about 3 months (around 04/19/2021).  >30 minutes spent with patient and wife today

## 2021-01-18 ENCOUNTER — Telehealth: Payer: Self-pay

## 2021-01-18 ENCOUNTER — Telehealth (HOSPITAL_COMMUNITY): Payer: Self-pay | Admitting: *Deleted

## 2021-01-18 LAB — CBC WITH DIFFERENTIAL/PLATELET
Basophils Absolute: 0.1 10*3/uL (ref 0.0–0.2)
Basos: 0 %
EOS (ABSOLUTE): 0.2 10*3/uL (ref 0.0–0.4)
Eos: 1 %
Hematocrit: 44.6 % (ref 37.5–51.0)
Hemoglobin: 14.6 g/dL (ref 13.0–17.7)
Immature Grans (Abs): 0 10*3/uL (ref 0.0–0.1)
Immature Granulocytes: 0 %
Lymphocytes Absolute: 1.2 10*3/uL (ref 0.7–3.1)
Lymphs: 9 %
MCH: 28.6 pg (ref 26.6–33.0)
MCHC: 32.7 g/dL (ref 31.5–35.7)
MCV: 88 fL (ref 79–97)
Monocytes Absolute: 1.4 10*3/uL — ABNORMAL HIGH (ref 0.1–0.9)
Monocytes: 10 %
Neutrophils Absolute: 10.7 10*3/uL — ABNORMAL HIGH (ref 1.4–7.0)
Neutrophils: 80 %
Platelets: 284 10*3/uL (ref 150–450)
RBC: 5.1 x10E6/uL (ref 4.14–5.80)
RDW: 13 % (ref 11.6–15.4)
WBC: 13.6 10*3/uL — ABNORMAL HIGH (ref 3.4–10.8)

## 2021-01-18 LAB — COMPREHENSIVE METABOLIC PANEL
ALT: 28 IU/L (ref 0–44)
AST: 20 IU/L (ref 0–40)
Albumin/Globulin Ratio: 1.7 (ref 1.2–2.2)
Albumin: 4.4 g/dL (ref 3.7–4.7)
Alkaline Phosphatase: 101 IU/L (ref 44–121)
BUN/Creatinine Ratio: 23 (ref 10–24)
BUN: 26 mg/dL (ref 8–27)
Bilirubin Total: 0.5 mg/dL (ref 0.0–1.2)
CO2: 19 mmol/L — ABNORMAL LOW (ref 20–29)
Calcium: 9.4 mg/dL (ref 8.6–10.2)
Chloride: 99 mmol/L (ref 96–106)
Creatinine, Ser: 1.13 mg/dL (ref 0.76–1.27)
Globulin, Total: 2.6 g/dL (ref 1.5–4.5)
Glucose: 201 mg/dL — ABNORMAL HIGH (ref 70–99)
Potassium: 4.5 mmol/L (ref 3.5–5.2)
Sodium: 136 mmol/L (ref 134–144)
Total Protein: 7 g/dL (ref 6.0–8.5)
eGFR: 67 mL/min/{1.73_m2} (ref >59)

## 2021-01-18 NOTE — Chronic Care Management (AMB) (Signed)
  Chronic Care Management   Note  01/18/2021 Name: [name] MRN: [id number] DOB: [birth year]  Samuel Morris is a 75 y.o. year old male who is a primary care patient of Valerie Roys, DO. Izaac Reisig is currently enrolled in care management services. An additional referral for Pharm D  was placed.   Follow up plan: Unsuccessful telephone outreach attempt made. A HIPAA compliant phone message was left for the patient providing contact information and requesting a return call.  The care management team will reach out to the patient again over the next 7 days.  If patient returns call to provider office, please advise to call [facility]  at [address], [place], [place], [place] [address] Direct Dial: [phone] [email or web address]

## 2021-01-18 NOTE — Telephone Encounter (Signed)
-----   Message from Oliver Barre, [place] sent at 01/18/2021  8:29 AM EST ----- Clancy Gourd, Please refer Mr Murdaugh to the AF clinic for initiation of anticoagulation given new onset atrial fibrillation detected on recent device remote interrogation. Thanks, Lysbeth Galas T. Quentin Ore, MD, [facility], [facility] Cardiac Electrophysiology

## 2021-01-18 NOTE — Progress Notes (Signed)
Remote ICD transmission.   

## 2021-01-18 NOTE — Telephone Encounter (Signed)
Patient scheduled to see Dr. Saunders Revel tomorrow 11/10 for hospital follow up. Message sent to Dr. Saunders Revel to address anticoagulation needed to avoid duplicate office visit.

## 2021-01-19 ENCOUNTER — Encounter: Payer: Self-pay | Admitting: Internal Medicine

## 2021-01-19 ENCOUNTER — Other Ambulatory Visit: Payer: Self-pay

## 2021-01-19 ENCOUNTER — Ambulatory Visit (INDEPENDENT_AMBULATORY_CARE_PROVIDER_SITE_OTHER): Payer: Medicare Other | Admitting: Internal Medicine

## 2021-01-19 VITALS — BP 100/54 | HR 75 | Ht 68.0 in | Wt 192.0 lb

## 2021-01-19 DIAGNOSIS — I5022 Chronic systolic (congestive) heart failure: Secondary | ICD-10-CM | POA: Diagnosis not present

## 2021-01-19 DIAGNOSIS — Z952 Presence of prosthetic heart valve: Secondary | ICD-10-CM

## 2021-01-19 DIAGNOSIS — I480 Paroxysmal atrial fibrillation: Secondary | ICD-10-CM

## 2021-01-19 DIAGNOSIS — E785 Hyperlipidemia, unspecified: Secondary | ICD-10-CM

## 2021-01-19 DIAGNOSIS — I359 Nonrheumatic aortic valve disorder, unspecified: Secondary | ICD-10-CM | POA: Diagnosis not present

## 2021-01-19 DIAGNOSIS — I214 Non-ST elevation (NSTEMI) myocardial infarction: Secondary | ICD-10-CM

## 2021-01-19 DIAGNOSIS — E1169 Type 2 diabetes mellitus with other specified complication: Secondary | ICD-10-CM

## 2021-01-19 DIAGNOSIS — I10 Essential (primary) hypertension: Secondary | ICD-10-CM | POA: Diagnosis not present

## 2021-01-19 DIAGNOSIS — I2510 Atherosclerotic heart disease of native coronary artery without angina pectoris: Secondary | ICD-10-CM

## 2021-01-19 MED ORDER — ISOSORBIDE MONONITRATE ER 30 MG PO TB24: 15.0000 mg | ORAL_TABLET | Freq: Every day | ORAL | 0 refills | Status: DC

## 2021-01-19 MED ORDER — APIXABAN 5 MG PO TABS: 5.0000 mg | ORAL_TABLET | Freq: Two times a day (BID) | ORAL | 0 refills | Status: DC

## 2021-01-19 NOTE — Progress Notes (Signed)
Follow-up Outpatient Visit Date: 01/19/2021  Primary Care Provider: Valerie Roys, DO [address] [place] [address]  Chief Complaint: Follow-up NSTEMI and acute on chronic HFrEF  HPI:  Samuel Morris is a 75 y.o. male with history of coronary artery disease with remote MI and subsequent CABG (2018 at [facility]; LIMA-LAD and SVG-PDA), chronic HFrEF due to ischemic cardiomyopathy complicated by ventricular tachycardia status post ICD, aortic stenosis status post bioprosthetic AVR (2018 at [facility]; 25 mm [facility]), newly diagnosed paroxysmal atrial fibrillation, hypertension, hyperlipidemia, type 2 diabetes mellitus, and obstructive sleep apnea, who presents for follow-up of recent NSTEMI.  He was admitted last week with sudden onset of chest tightness/pressure while helping his grandson get ready for school.  He did not experience any relief with nitroglycerin and was brought to the ER by EMS.  He was noted to have mild troponin elevation (82->120) and underwent urgent cardiac catheterization.  He was found to have severe native CAD.  Patent bypass grafts to the LAD and PDA were observed, though LIMA-LAD was relatively small with a critical stenosis at the LIMA-LAD anastomosis.  LVEDP was also severely elevated (LVEDP 40 mmHg).  Decision was made to pursue medical therapy with possible staged PCI.  Follow-up limited echo again showed LVEF 30-35% with akinesis of the LAD territory.  Interval remote interrogation of his ICD also demonstrated a single episode of atrial fibrillation lasting 13 minutes and 10/2020; this is a new diagnosis for Mr. Heacock.  Today, Mr. Barbary reports that he is feeling fairly well.  He has not had any further chest pain like what brought him to the emergency department last month.  He is feeling a bit more fatigued, however and also experiences intermittent lightheadedness.  His wife is concerned about some irregularity that she has noticed in his heartbeat.  He denies significant  shortness of breath as well as palpitations and edema (he did not have significant edema at the time of his catheterization when LVEDP was severely elevated).  He remains on aspirin and clopidogrel with easy bruising but otherwise no significant bleeding.  --------------------------------------------------------------------------------------------------  Past Medical History:  Diagnosis Date   Cancer ([place])    Scalp   Cataract    Diabetes mellitus without complication ([place])    Heart attack ([place])    Heart disease    Hyperlipidemia    Hypertension    Sleep apnea    Stenosis of aorta    Past Surgical History:  Procedure Laterality Date   [place] CATH AND CORS/GRAFTS ANGIOGRAPHY N/A 01/10/2021   Procedure: LEFT HEART CATH AND CORS/GRAFTS ANGIOGRAPHY;  Surgeon: Nelva Bush, MD;  Location: [place] [facility];  Service: Cardiovascular;  Laterality: N/A;     Recent CV Pertinent Labs: Lab Results  Component Value Date   CHOL 130 10/11/2020   HDL 54 10/11/2020   LDLCALC 58 10/11/2020   TRIG 98 10/11/2020   INR 1.0 01/10/2021   K 4.5 01/17/2021   BUN 26 01/17/2021   CREATININE 1.13 01/17/2021    Past medical and surgical history were reviewed and updated in EPIC.  Current Meds  Medication Sig   albuterol (VENTOLIN HFA) 108 (90 Base) MCG/ACT inhaler Inhale 1 puff into the lungs every 4 (four) hours as needed for wheezing or shortness of breath.   Ascorbic Acid (VITAMIN C PO) Take 100 mg by mouth daily.  aspirin EC 81 MG tablet Take 81 mg by mouth daily.   Calcium-Magnesium 100-50 MG TABS Take 1 tablet by mouth daily.   Cholecalciferol (VITAMIN D-1000 MAX ST) 25 MCG (1000 UT) tablet Take 1,000 Units by mouth daily.   clopidogrel (PLAVIX) 75 MG tablet Take 1 tablet (75 mg total) by mouth daily with breakfast.   dapagliflozin propanediol (FARXIGA) 10 MG TABS tablet Take 1 tablet (10 mg  total) by mouth daily.   diclofenac Sodium (VOLTAREN) 1 % GEL SMARTSIG:4 Gram(s) Topical Twice Daily   ENTRESTO 49-51 MG Take 1 tablet by mouth 2 (two) times daily.   ezetimibe (ZETIA) 10 MG tablet Take 1 tablet (10 mg total) by mouth daily.   furosemide (LASIX) 40 MG tablet Take 1 tablet (40 mg total) by mouth daily.   hydroxychloroquine (PLAQUENIL) 200 MG tablet Take 200 mg by mouth 2 (two) times daily.   isosorbide mononitrate (IMDUR) 30 MG 24 hr tablet Take 1 tablet (30 mg total) by mouth daily.   metoprolol succinate (TOPROL-XL) 100 MG 24 hr tablet Take 100 mg by mouth daily. Take with or immediately following a meal.   nitroGLYCERIN (NITROSTAT) 0.4 MG SL tablet Place 1 tablet (0.4 mg total) under the tongue every 5 (five) minutes as needed for chest pain.   rosuvastatin (CRESTOR) 20 MG tablet Take 1 tablet (20 mg total) by mouth at bedtime.   spironolactone (ALDACTONE) 25 MG tablet Take 1 tablet (25 mg total) by mouth daily.   vitamin B-12 (CYANOCOBALAMIN) 1000 MCG tablet Take by mouth daily.    Allergies: Patient has no known allergies.  Social History   Tobacco Use   Smoking status: Never   Smokeless tobacco: Never  Vaping Use   Vaping Use: Never used  Substance Use Topics   Alcohol use: Yes    Comment: On occasion   Drug use: Never    Family History  Problem Relation Age of Onset   Heart disease Mother    Heart attack Father    Arthritis Sister    Hypertension Sister    Hypertension Brother    Heart disease Paternal Grandmother    Heart murmur Sister     Review of Systems: A 12-system review of systems was performed and was negative except as noted in the HPI.  --------------------------------------------------------------------------------------------------  Physical Exam: BP (!) 100/54 (BP Location: Left Arm, Patient Position: Sitting, Cuff Size: Large)   Pulse 75   Ht 5\' 8"  (1.727 m)   Wt 192 lb (87.1 kg)   SpO2 97%   BMI 29.19 kg/m   General:   NAD. Neck: No gross JVD, though difficult to assess. Lungs: Clear to auscultation bilaterally without wheezes or crackles. Heart: Regular rate and rhythm without murmurs, rubs, or gallops. Abdomen: Soft, nontender, nondistended. Extremities: No lower extremity edema.  EKG: Normal sinus rhythm with septal and inferior infarcts as well as lateral T wave inversions.  Since prior tracing on 01/10/2021, inferior infarct and lateral T wave inversions are now present.  Lab Results  Component Value Date   WBC 13.6 (H) 01/17/2021   HGB 14.6 01/17/2021   HCT 44.6 01/17/2021   MCV 88 01/17/2021   PLT 284 01/17/2021    Lab Results  Component Value Date   NA 136 01/17/2021   K 4.5 01/17/2021   CL 99 01/17/2021   CO2 19 (L) 01/17/2021   BUN 26 01/17/2021   CREATININE 1.13 01/17/2021   GLUCOSE 201 (H) 01/17/2021   ALT 28 01/17/2021  Lab Results  Component Value Date   CHOL 130 10/11/2020   HDL 54 10/11/2020   LDLCALC 58 10/11/2020   TRIG 98 10/11/2020    --------------------------------------------------------------------------------------------------  ASSESSMENT AND PLAN: NSTEMI: Mr. Cathey has not had any further chest pain since his recent hospitalization.  At that time, catheterization showed severe native CAD with chronic total occlusions of the mid LAD and proximal RCA.  LIMA-LAD was found to be small with significant tortuosity in its distal segment and 99% stenosis at its anastomosis with the LAD.  SVG-PDA was widely patent, though PL branches arising from the RCA were not supplied by the graft (they fill via left-right collaterals).  Given unfavorable anatomy of LIMA-LAD anastomotic lesion and severely elevated LVEDP, intervention was not undertaken at that time.  I have reviewed his chart images with Dr. Fletcher Anon and we both agree that PCI would be challenging and should be reserved for refractory symptoms in spite of aggressive medical therapy.  The patient's wife brings up the  possibility of redo bypass, which I would be very reluctant to pursue given his still patent SVG-PDA and other comorbidities.  Given borderline low blood pressure and some lightheadedness/fatigue, we will decrease isosorbide mononitrate to 15 mg daily.  I will also discontinue clopidogrel in favor of adding apixaban for newly diagnosed paroxysmal atrial fibrillation.  Chronic HFrEF: Mr. Solem appears euvolemic on examination today, though exam was also not consistent with his severely elevated LVEDP at time of recent catheterization.  He has not lost much weight despite being on standing furosemide.  We will continue furosemide 40 mg p.o. daily as well as his current goal-directed medical therapy consisting of of Entresto, spironolactone, dapagliflozin, and metoprolol.  Soft blood pressure precludes escalation at this time.  If he has persistent heart failure symptoms, we would need to consider viability study to determine utility of revascularizing the LAD territory.  His ICD may preclude cardiac MRI.  In this case, we would need to consider sending Mr. Olthoff to [facility] for PET viability study.  I will reach out to Drs. Quentin Ore and Shumann to inquire about the feasibility of MRI with his particular device.  Paroxysmal atrial fibrillation: Incidentally noted on recent device interrogation.  Mr. Tappan was not symptomatic at that time (atrial fibrillation happened approximately 2 months before his hospitalization for NSTEMI).  We have agreed to discontinue clopidogrel and initiate apixaban 5 mg twice daily in the setting of a CHA2DS2-VASc score of at least 6.  Continue current dose of metoprolol succinate and ongoing EP follow-up with Dr. Quentin Ore.  Aortic valve disease status post bioprosthetic aortic valve replacement: Recent echoes and catheterization demonstrate appropriate valve function.  Continue routine follow-up.  Hyperlipidemia associated with type 2 diabetes mellitus: Lipids adequately controlled on last  check in August.  Continue current regimen of rosuvastatin and ezetimibe.  Hypertension: Blood pressure borderline low today with some fatigue and lightheadedness.  As above, we will de-escalate isosorbide mononitrate.  Follow-up: Return to clinic in 1 month.  Nelva Bush, MD 01/19/2021 11:42 AM

## 2021-01-19 NOTE — Patient Instructions (Signed)
Medication Instructions:   Your physician has recommended you make the following change in your medication:   STOP Plavix (Clopidogrel)  START Eliquis 5 mg TWICE daily - Samples given today in office. When you pick up your prescription, you may use 30 day free trial card that was given today in the office.   DECREASE Imdur (Isosorbide mononitrate) 15 mg daily   *If you need a refill on your cardiac medications before your next appointment, please call your pharmacy*   Lab Work:  None ordered  Testing/Procedures:  None ordered   Follow-Up: At [facility], you and your health needs are our priority.  As part of our continuing mission to provide you with exceptional heart care, we have created designated Provider Care Teams.  These Care Teams include your primary Cardiologist (physician) and Advanced Practice Providers (APPs -  Physician Assistants and Nurse Practitioners) who all work together to provide you with the care you need, when you need it.  We recommend signing up for the patient portal called "MyChart".  Sign up information is provided on this After Visit Summary.  MyChart is used to connect with patients for Virtual Visits (Telemedicine).  Patients are able to view lab/test results, encounter notes, upcoming appointments, etc.  Non-urgent messages can be sent to your provider as well.   To learn more about what you can do with MyChart, go to [email or web address].    Your next appointment:   1 month(s)  The format for your next appointment:   In Person  Provider:   You may see Dr. Harrell Gave End or one of the following Advanced Practice Providers on your designated Care Team:   Murray Hodgkins, NP Christell Faith, PA-C Cadence Kathlen Mody, [place]

## 2021-01-20 ENCOUNTER — Encounter: Payer: Self-pay | Admitting: Internal Medicine

## 2021-01-23 ENCOUNTER — Encounter: Payer: Self-pay | Admitting: Family Medicine

## 2021-01-23 NOTE — Assessment & Plan Note (Signed)
Reassured patient. Continue to monitor.  

## 2021-01-23 NOTE — Assessment & Plan Note (Signed)
Continue mediation management. Currently asymptomatic. To follow up with cardiology later this week. Continue to monitor.

## 2021-01-23 NOTE — Assessment & Plan Note (Signed)
Stable with A1c of 6.8. Continue current regimen. Continue to monitor. Recheck 3 months.

## 2021-01-23 NOTE — Progress Notes (Signed)
Patient ID: Samuel Morris, male    DOB: 1945-05-04, 75 y.o.   MRN: [id number]  HPI  Samuel Morris is a 75 y/o male with a history of DM, hyperlipidemia, HTN, VT (ICD), CAD (MI/ CABG), sleep apnea, PAF, aortic stenosis and chronic heart failure.   Echo report from 01/11/21 reviewed and showed an EF of 30-35% along with mild Samuel.   LHC done 01/10/21 and showed: Severe multivessel coronary artery disease with chronic total occlusions of the ostial/proximal LAD and mid RCA as well as moderate-severe disease involving D1, ramus intermedius, and LCx/OM branches. Small but patent LIMA-LAD with 99% stenosis at distal anastomosis and TIMI I flow into relatively small mid/distal LAD. Widely patent SVG-PDA.  PDA does not backfill the PL branches, which are chronically occluded and fill via left-right collaterals. Severely elevated left ventricular filling pressure (LVEDP 40 mmHg). Bioprosthetic aortic valve with mild gradient (peak-to-peak gradient 10-15 mmHg).  Admitted 01/10/21 due to chest pain with left arm radiation. Resolved with IV NTG. Cardiology consult obtained. Started on heparin infusion due to NSTEMI. Cath completed with plan to maximize medications. PCI in the future if needed. Discharged after 3 days.   He presents today for his initial visit with a chief complaint of minimal fatigue upon moderate exertion. Describes this as chronic in nature. He has associated easy bruising and knee pain along with this. He denies any difficulty sleeping, abdominal distention, palpitations, pedal edema, chest pain, shortness of breath, cough, dizziness or weight gain.   Past Medical History:  Diagnosis Date   Arrhythmia    paroxysmal atrial fibrillation   Cancer (HCC)    Scalp   Cataract    CHF (congestive heart failure) (HCC)    Diabetes mellitus without complication ([place])    Heart attack ([place])    Heart disease    Hyperlipidemia    Hypertension    Sleep apnea    Stenosis of aorta    Ventricular  tachycardia    Past Surgical History:  Procedure Laterality Date   [place] CATH AND CORS/GRAFTS ANGIOGRAPHY N/A 01/10/2021   Procedure: LEFT HEART CATH AND CORS/GRAFTS ANGIOGRAPHY;  Surgeon: Nelva Bush, MD;  Location: [place] [facility];  Service: Cardiovascular;  Laterality: N/A;   Family History  Problem Relation Age of Onset   Heart disease Mother    Heart attack Father    Arthritis Sister    Hypertension Sister    Hypertension Brother    Heart disease Paternal Grandmother    Heart murmur Sister    Social History   Tobacco Use   Smoking status: Never   Smokeless tobacco: Never  Substance Use Topics   Alcohol use: Yes    Comment: On occasion   No Known Allergies Prior to Admission medications   Medication Sig Start Date End Date Taking? Authorizing Provider  albuterol (VENTOLIN HFA) 108 (90 Base) MCG/ACT inhaler Inhale 1 puff into the lungs every 4 (four) hours as needed for wheezing or shortness of breath. 04/06/20  Yes Rumball, Jake Church, DO  apixaban (ELIQUIS) 5 MG TABS tablet Take 1 tablet (5 mg total) by mouth 2 (two) times daily. 01/19/21 02/18/21 Yes End, Harrell Gave, MD  Ascorbic Acid (VITAMIN C PO) Take 100 mg by mouth daily.   Yes [provider]  aspirin EC 81 MG tablet Take 81 mg by mouth daily. 01/20/18  Yes [provider]  Calcium-Magnesium 100-50 MG TABS Take 1 tablet by mouth daily.   Yes [provider]  Cholecalciferol (VITAMIN D-1000 MAX ST) 25 MCG (1000 UT) tablet Take 1,000 Units by mouth daily.   Yes [provider]  dapagliflozin propanediol (FARXIGA) 10 MG TABS tablet Take 1 tablet (10 mg total) by mouth daily. 01/14/21 02/13/21 Yes Shawna Clamp, MD  diclofenac Sodium (VOLTAREN) 1 % GEL SMARTSIG:4 Gram(s) Topical Twice Daily 09/30/20  Yes [provider]  ENTRESTO 49-51 MG Take 1 tablet by mouth 2 (two) times daily.  10/24/18  Yes [provider]  ezetimibe (ZETIA) 10 MG tablet Take 1 tablet (10 mg total) by mouth daily. 10/11/20  Yes Johnson, Megan P, DO  furosemide (LASIX) 40 MG tablet Take 1 tablet (40 mg total) by mouth daily. 01/14/21  Yes Shawna Clamp, MD  hydroxychloroquine (PLAQUENIL) 200 MG tablet Take 200 mg by mouth 2 (two) times daily. 10/24/20  Yes [provider]  isosorbide mononitrate (IMDUR) 30 MG 24 hr tablet Take 0.5 tablets (15 mg total) by mouth daily. 01/19/21 04/19/21 Yes End, Harrell Gave, MD  metoprolol succinate (TOPROL-XL) 100 MG 24 hr tablet Take 100 mg by mouth daily. Take with or immediately following a meal.   Yes [provider]  nitroGLYCERIN (NITROSTAT) 0.4 MG SL tablet Place 1 tablet (0.4 mg total) under the tongue every 5 (five) minutes as needed for chest pain. 01/12/21  Yes Minna Merritts, MD  rosuvastatin (CRESTOR) 20 MG tablet Take 1 tablet (20 mg total) by mouth at bedtime. 10/11/20  Yes Johnson, Megan P, DO  spironolactone (ALDACTONE) 25 MG tablet Take 1 tablet (25 mg total) by mouth daily. 10/11/20  Yes Johnson, Megan P, DO  Turmeric (QC TUMERIC COMPLEX PO) Take by mouth. Unsure of dose   Yes [provider]  vitamin B-12 (CYANOCOBALAMIN) 1000 MCG tablet Take by mouth daily.   Yes [provider]   Review of Systems  Constitutional:  Positive for fatigue (minimal). Negative for appetite change.  HENT:  Negative for congestion, postnasal drip and sore throat.   Eyes: Negative.   Respiratory:  Negative for cough, chest tightness and shortness of breath.   Cardiovascular:  Negative for chest pain, palpitations and leg swelling.  Gastrointestinal:  Negative for abdominal distention and abdominal pain.  Endocrine: Negative.   Genitourinary: Negative.   Musculoskeletal:  Positive for arthralgias (knee pain).  Skin: Negative.   Allergic/Immunologic: Negative.   Neurological:  Negative for dizziness and light-headedness.   Hematological:  Negative for adenopathy. Bruises/bleeds easily.  Psychiatric/Behavioral:  Negative for dysphoric mood and sleep disturbance (sleeping on 2 pillows). The patient is not nervous/anxious.    Vitals:   01/24/21 1028  BP: 127/77  Pulse: 87  Resp: 18  SpO2: 99%  Weight: 192 lb 8 oz (87.3 kg)  Height: 5\' 8"  (1.727 m)   Wt Readings from Last 3 Encounters:  01/24/21 192 lb 8 oz (87.3 kg)  01/19/21 192 lb (87.1 kg)  01/17/21 189 lb 3.2 oz (85.8 kg)   Lab Results  Component Value Date   CREATININE 1.13 01/17/2021   CREATININE 0.92 01/12/2021   CREATININE 0.96 01/11/2021   Physical Exam Vitals and nursing note reviewed. Exam conducted with a chaperone present (wife).  Constitutional:      Appearance: Normal appearance.  HENT:     Head: Normocephalic and atraumatic.  Cardiovascular:     Rate and Rhythm: Normal rate and regular rhythm.  Pulmonary:     Effort: Pulmonary effort  is normal. No respiratory distress.     Breath sounds: No wheezing or rales.  Abdominal:     General: There is no distension.     Palpations: Abdomen is soft.     Tenderness: There is no abdominal tenderness.  Musculoskeletal:        General: No tenderness.     Cervical back: Normal range of motion and neck supple.     Right lower leg: No edema.     Left lower leg: No edema.  Skin:    General: Skin is warm and dry.  Neurological:     General: No focal deficit present.     Mental Status: He is alert and oriented to person, place, and time.  Psychiatric:        Mood and Affect: Mood normal.        Behavior: Behavior normal.        Thought Content: Thought content normal.   Assessment & Plan:  1: Chronic heart failure with reduced ejection fraction- - NYHA class II - euvolemic today - weighing daily and instructed to call for an overnight weight gain of > 2 pounds or a weekly weight gain of >5 pounds - adding minimal salt and he was encouraged to not add any salt to his food; low  sodium cookbook provided to he and his wife today - saw EP Quentin Ore) 10/05/20; returns 02/01/21 - had VT in the past so has ICD - on GDMT of entresto, spironolactone, dapagliflozin & metoprolol - unable to escalate meds due to low BP's in the past; consider titrating up meds in the future if possible  2: HTN- - BP looks good today (127/77) - saw PCP Wynetta Emery) 01/17/21; returns 04/13/21 - BMP 01/17/21 reviewed and showed sodium 136, potassium 4.5, creatinine 1.13 and GFR 67  3: Type 2 DM- - A1c 01/12/21 was 6.8%  4: Aortic stenosis- - status post bioprosthetic AVR (2018 at [facility]; 25 mm [facility])  - saw cardiology (End) 01/19/21 & isosorbide was decreased to 15mg  daily   Medication bottles reviewed.   Due to HF stability, will not make a return appointment at this time. Advised patient that he could call back at anytime for any questions or to schedule another appointment. Both he and his wife were comfortable with this plan.

## 2021-01-23 NOTE — Assessment & Plan Note (Signed)
Under good control on current regimen. Continue current regimen. Continue to monitor. Call with any concerns.   

## 2021-01-24 ENCOUNTER — Encounter: Payer: Self-pay | Admitting: Family

## 2021-01-24 ENCOUNTER — Other Ambulatory Visit: Payer: Self-pay

## 2021-01-24 ENCOUNTER — Ambulatory Visit: Payer: Medicare Other | Attending: Family | Admitting: Family

## 2021-01-24 VITALS — BP 127/77 | HR 87 | Resp 18 | Ht 68.0 in | Wt 192.5 lb

## 2021-01-24 DIAGNOSIS — I110 Hypertensive heart disease with heart failure: Secondary | ICD-10-CM | POA: Insufficient documentation

## 2021-01-24 DIAGNOSIS — M25569 Pain in unspecified knee: Secondary | ICD-10-CM | POA: Diagnosis not present

## 2021-01-24 DIAGNOSIS — I2510 Atherosclerotic heart disease of native coronary artery without angina pectoris: Secondary | ICD-10-CM | POA: Diagnosis not present

## 2021-01-24 DIAGNOSIS — Z951 Presence of aortocoronary bypass graft: Secondary | ICD-10-CM | POA: Insufficient documentation

## 2021-01-24 DIAGNOSIS — I480 Paroxysmal atrial fibrillation: Secondary | ICD-10-CM | POA: Diagnosis not present

## 2021-01-24 DIAGNOSIS — I5022 Chronic systolic (congestive) heart failure: Secondary | ICD-10-CM | POA: Diagnosis present

## 2021-01-24 DIAGNOSIS — I350 Nonrheumatic aortic (valve) stenosis: Secondary | ICD-10-CM | POA: Insufficient documentation

## 2021-01-24 DIAGNOSIS — E785 Hyperlipidemia, unspecified: Secondary | ICD-10-CM | POA: Insufficient documentation

## 2021-01-24 DIAGNOSIS — G4730 Sleep apnea, unspecified: Secondary | ICD-10-CM | POA: Insufficient documentation

## 2021-01-24 DIAGNOSIS — I10 Essential (primary) hypertension: Secondary | ICD-10-CM | POA: Diagnosis not present

## 2021-01-24 DIAGNOSIS — Z953 Presence of xenogenic heart valve: Secondary | ICD-10-CM | POA: Insufficient documentation

## 2021-01-24 DIAGNOSIS — E1169 Type 2 diabetes mellitus with other specified complication: Secondary | ICD-10-CM

## 2021-01-24 DIAGNOSIS — E1136 Type 2 diabetes mellitus with diabetic cataract: Secondary | ICD-10-CM | POA: Diagnosis not present

## 2021-01-24 DIAGNOSIS — I252 Old myocardial infarction: Secondary | ICD-10-CM | POA: Diagnosis not present

## 2021-01-24 NOTE — Patient Instructions (Addendum)
Continue weighing daily and call for an overnight weight gain of > 2 pounds or a weekly weight gain of >5 pounds.    Call us in the future if you need us for anything  

## 2021-01-31 NOTE — Progress Notes (Signed)
Electrophysiology Office Follow up Visit Note:    Date:  02/01/2021   ID:  Samuel Morris, DOB [birth year], MRN [id number]  PCP:  Valerie Roys, DO  [facility] Cardiologist:  Nelva Bush, MD  [facility] Electrophysiologist:  Vickie Epley, MD    Interval History:    Samuel Morris is a 75 y.o. male who presents for a follow up visit.  I last saw the patient October 05, 2020 for an evaluation of VT and ICD care.  The patient has previously had an appropriate shock for ventricular fibrillation in 2021.  At that appointment we updated his echocardiogram which showed moderately reduced left ventricular function of 30% Normal RV function.  The patient was seen by Dr. Saunders Revel on January 19, 2021.  At that appointment he reported fatigue and intermittent lightheadedness.  There is also some irregularity in his heart rate that his wife is noticed.  Eliquis was added for a new diagnosis of atrial fibrillation and Plavix was discontinued at that appointment.  He is with his wife today.  He has felt fatigued recently.  He is undergoing work-up with Dr. Saunders Revel to determine whether or not any further coronary intervention is indicated for his fatigue and history of coronary artery disease.      Past Medical History:  Diagnosis Date   Arrhythmia    paroxysmal atrial fibrillation   Cancer (HCC)    Scalp   Cataract    CHF (congestive heart failure) (HCC)    Diabetes mellitus without complication ([place])    Heart attack ([place])    Heart disease    Hyperlipidemia    Hypertension    Sleep apnea    Stenosis of aorta    Ventricular tachycardia     Past Surgical History:  Procedure Laterality Date   [place] CATH AND CORS/GRAFTS ANGIOGRAPHY N/A 01/10/2021   Procedure: LEFT HEART CATH AND CORS/GRAFTS ANGIOGRAPHY;  Surgeon: Nelva Bush, MD;  Location: [place] [facility];  Service: Cardiovascular;   Laterality: N/A;    Current Medications: Current Meds  Medication Sig   albuterol (VENTOLIN HFA) 108 (90 Base) MCG/ACT inhaler Inhale 1 puff into the lungs every 4 (four) hours as needed for wheezing or shortness of breath.   apixaban (ELIQUIS) 5 MG TABS tablet Take 1 tablet (5 mg total) by mouth 2 (two) times daily.   Ascorbic Acid (VITAMIN C PO) Take 100 mg by mouth daily.   aspirin EC 81 MG tablet Take 81 mg by mouth daily.   Calcium-Magnesium 100-50 MG TABS Take 1 tablet by mouth daily.   Cholecalciferol (VITAMIN D-1000 MAX ST) 25 MCG (1000 UT) tablet Take 1,000 Units by mouth daily.   dapagliflozin propanediol (FARXIGA) 10 MG TABS tablet Take 1 tablet (10 mg total) by mouth daily.   diclofenac Sodium (VOLTAREN) 1 % GEL SMARTSIG:4 Gram(s) Topical Twice Daily   ENTRESTO 49-51 MG Take 1 tablet by mouth 2 (two) times daily.   ezetimibe (ZETIA) 10 MG tablet Take 1 tablet (10 mg total) by mouth daily.   furosemide (LASIX) 40 MG tablet Take 1 tablet (40 mg total) by mouth daily.   hydroxychloroquine (PLAQUENIL) 200 MG tablet Take 200 mg by mouth 2 (two) times daily.   isosorbide mononitrate (IMDUR) 30 MG 24 hr tablet Take 0.5 tablets (15 mg total) by mouth daily.   metoprolol succinate (TOPROL-XL) 100 MG 24 hr tablet  Take 100 mg by mouth daily. Take with or immediately following a meal.   nitroGLYCERIN (NITROSTAT) 0.4 MG SL tablet Place 1 tablet (0.4 mg total) under the tongue every 5 (five) minutes as needed for chest pain.   rosuvastatin (CRESTOR) 20 MG tablet Take 1 tablet (20 mg total) by mouth at bedtime.   spironolactone (ALDACTONE) 25 MG tablet Take 1 tablet (25 mg total) by mouth daily.   Turmeric (QC TUMERIC COMPLEX PO) Take by mouth. Unsure of dose   vitamin B-12 (CYANOCOBALAMIN) 1000 MCG tablet Take by mouth daily.     Allergies:   Patient has no known allergies.   Social History   Socioeconomic History   Marital status: Married    Spouse name: Not on file   Number of  children: Not on file   Years of education: Not on file   Highest education level: Not on file  Occupational History   Occupation: retired  Tobacco Use   Smoking status: Never   Smokeless tobacco: Never  Vaping Use   Vaping Use: Never used  Substance and Sexual Activity   Alcohol use: Yes    Comment: On occasion   Drug use: Never   Sexual activity: Yes    Birth control/protection: None  Other Topics Concern   Not on file  Social History Narrative   Not on file   Social Determinants of Health   Financial Resource Strain: Low Risk    Difficulty of Paying Living Expenses: Not hard at all  Food Insecurity: No Food Insecurity   Worried About Charity fundraiser in the Last Year: Never true   [place] in the Last Year: Never true  Transportation Needs: No Transportation Needs   Lack of Transportation (Medical): No   Lack of Transportation (Non-Medical): No  Physical Activity: Inactive   Days of Exercise per Week: 0 days   Minutes of Exercise per Session: 0 min  Stress: No Stress Concern Present   Feeling of Stress : Not at all  Social Connections: Not on file     Family History: The patient's family history includes Arthritis in his sister; Heart attack in his father; Heart disease in his mother and paternal grandmother; Heart murmur in his sister; Hypertension in his brother and sister.  ROS:   Please see the history of present illness.    All other systems reviewed and are negative.  EKGs/Labs/Other Studies Reviewed:    The following studies were reviewed today:   EKG:  The ekg ordered today demonstrates sinus rhythm.  PVCs.  PVCs have a superior axis.  February 01, 2021 device interrogation in clinic personally reviewed Lead parameter stable Battery longevity 7.5 years 13 minutes of atrial fibrillation recorded Longest episode of AF 13 minutes Changing remote interrogations to our clinic  Recent Labs: 01/17/2021: ALT 28; BUN 26; Creatinine, Ser 1.13;  Hemoglobin 14.6; Platelets 284; Potassium 4.5; Sodium 136  Recent Lipid Panel    Component Value Date/Time   CHOL 130 10/11/2020 0833   TRIG 98 10/11/2020 0833   HDL 54 10/11/2020 0833   LDLCALC 58 10/11/2020 0833    Physical Exam:    VS:  BP 126/70   Pulse 83   Ht 5\' 8"  (1.727 m)   Wt 192 lb 9.6 oz (87.4 kg)   SpO2 97%   BMI 29.28 kg/m     Wt Readings from Last 3 Encounters:  02/01/21 192 lb 9.6 oz (87.4 kg)  01/24/21 192 lb 8 oz (  87.3 kg)  01/19/21 192 lb (87.1 kg)     GEN:  Well nourished, well developed in no acute distress HEENT: Normal NECK: No JVD; No carotid bruits LYMPHATICS: No lymphadenopathy CARDIAC: RRR, no murmurs, rubs, gallops.  Pocket well-healed RESPIRATORY:  Clear to auscultation without rales, wheezing or rhonchi  ABDOMEN: Soft, non-tender, non-distended MUSCULOSKELETAL:  No edema; No deformity  SKIN: Warm and dry NEUROLOGIC:  Alert and oriented x 3 PSYCHIATRIC:  Normal affect        ASSESSMENT:    1. Chronic HFrEF (heart failure with reduced ejection fraction) ([place])   2. Primary hypertension   3. Paroxysmal atrial fibrillation (HCC)   4. S/P AVR   5. ICD (implantable cardioverter-defibrillator) in place   6. Ischemic cardiomyopathy    PLAN:    In order of problems listed above:  #Chronic systolic heart failure NYHA class II today.  Warm dry on exam.  Continue current regimen.  Follows with Dr. Saunders Revel.  #Hypertension Controlled.  Continue current regimen  #ICD in place Device functioning appropriately.  He does have an MRI conditional device oh if an MRI is indicated to assess viability, that is okay.  Follow-up with me in clinic in 1 year or sooner as needed.   Medication Adjustments/Labs and Tests Ordered: Current medicines are reviewed at length with the patient today.  Concerns regarding medicines are outlined above.  Orders Placed This Encounter  Procedures   EKG 12-Lead   No orders of the defined types were placed in this  encounter.    Signed, Lars Mage, MD, [facility], [facility] 02/01/2021 10:32 AM    Electrophysiology [facility]

## 2021-02-01 ENCOUNTER — Other Ambulatory Visit: Payer: Self-pay

## 2021-02-01 ENCOUNTER — Encounter: Payer: Self-pay | Admitting: Cardiology

## 2021-02-01 ENCOUNTER — Ambulatory Visit (INDEPENDENT_AMBULATORY_CARE_PROVIDER_SITE_OTHER): Payer: Medicare Other | Admitting: Cardiology

## 2021-02-01 VITALS — BP 126/70 | HR 83 | Ht 68.0 in | Wt 192.6 lb

## 2021-02-01 DIAGNOSIS — Z95810 Presence of automatic (implantable) cardiac defibrillator: Secondary | ICD-10-CM

## 2021-02-01 DIAGNOSIS — Z952 Presence of prosthetic heart valve: Secondary | ICD-10-CM | POA: Diagnosis not present

## 2021-02-01 DIAGNOSIS — I5022 Chronic systolic (congestive) heart failure: Secondary | ICD-10-CM

## 2021-02-01 DIAGNOSIS — I480 Paroxysmal atrial fibrillation: Secondary | ICD-10-CM

## 2021-02-01 DIAGNOSIS — I255 Ischemic cardiomyopathy: Secondary | ICD-10-CM | POA: Diagnosis not present

## 2021-02-01 DIAGNOSIS — I10 Essential (primary) hypertension: Secondary | ICD-10-CM | POA: Diagnosis not present

## 2021-02-01 LAB — CUP PACEART INCLINIC DEVICE CHECK
Brady Statistic AP VP Percent: 0.3 %
Brady Statistic AP VS Percent: 7.3 %
Brady Statistic AS VP Percent: 0.1 % — CL
Brady Statistic AS VS Percent: 92.3 %
Date Time Interrogation Session: 20221123142712
HighPow Impedance: 78 Ohm
Implantable Lead Implant Date: 20121205
Implantable Lead Implant Date: 20121205
Implantable Lead Location: 753859
Implantable Lead Location: 753860
Implantable Lead Model: 5076
Implantable Pulse Generator Implant Date: 20190702
Lead Channel Impedance Value: 494 Ohm
Lead Channel Impedance Value: 513 Ohm
Lead Channel Pacing Threshold Amplitude: 0.75 V
Lead Channel Pacing Threshold Amplitude: 0.75 V
Lead Channel Pacing Threshold Pulse Width: 0.4 ms
Lead Channel Pacing Threshold Pulse Width: 0.4 ms
Lead Channel Sensing Intrinsic Amplitude: 2.3 mV
Lead Channel Sensing Intrinsic Amplitude: 20 mV

## 2021-02-01 LAB — PACEMAKER DEVICE OBSERVATION

## 2021-02-01 NOTE — Patient Instructions (Addendum)
Medication Instructions:  Your physician recommends that you continue on your current medications as directed. Please refer to the Current Medication list given to you today. *If you need a refill on your cardiac medications before your next appointment, please call your pharmacy*  Lab Work: None ordered. If you have labs (blood work) drawn today and your tests are completely normal, you will receive your results only by: [place] (if you have MyChart) OR A paper copy in the mail If you have any lab test that is abnormal or we need to change your treatment, we will call you to review the results.  Testing/Procedures: None ordered.  Follow-Up: At [facility], you and your health needs are our priority.  As part of our continuing mission to provide you with exceptional heart care, we have created designated Provider Care Teams.  These Care Teams include your primary Cardiologist (physician) and Advanced Practice Providers (APPs -  Physician Assistants and Nurse Practitioners) who all work together to provide you with the care you need, when you need it.  Your next appointment:   Your physician wants you to follow-up in: one year with Dr. Quentin Ore.  You will receive a reminder letter in the mail two months in advance. If you don't receive a letter, please call our office to schedule the follow-up appointment.  Remote monitoring is used to monitor your ICD from home. This monitoring reduces the number of office visits required to check your device to one time per year. It allows [place] to keep an eye on the functioning of your device to ensure it is working properly. You are scheduled for a device check from home on 04/14/2021. You may send your transmission at any time that day. If you have a wireless device, the transmission will be sent automatically. After your physician reviews your transmission, you will receive a postcard with your next transmission date.

## 2021-02-08 ENCOUNTER — Telehealth: Payer: Self-pay | Admitting: *Deleted

## 2021-02-08 MED ORDER — APIXABAN 5 MG PO TABS: 5.0000 mg | ORAL_TABLET | Freq: Two times a day (BID) | ORAL | 3 refills | Status: DC

## 2021-02-08 NOTE — Telephone Encounter (Signed)
Pt dropped off pt assistance application ([employer]) for Eliquis.  Called and notified pt that I will fill out, have Dr. Saunders Revel sign and will send to [employer] PAF.  However, I still need pt's signature on form.  Advised pt he may come by our office and sign form at his convenience.  Pt states that he will come by office tomorrow 02/09/21.  Will leave paperwork at front for pt to sign.  This RN will keep remaining application form at her desk.  Rx and supporting documentation printed per [employer] PAF requirement.

## 2021-02-09 NOTE — Telephone Encounter (Signed)
Patient signed paperwork & placed in box

## 2021-02-10 NOTE — Telephone Encounter (Signed)
Faxed completed application to [employer] PAF @ [phone]. Fax confirmation received.  Forms placed in designated file cabinet.

## 2021-02-15 ENCOUNTER — Telehealth: Payer: Self-pay

## 2021-02-15 ENCOUNTER — Other Ambulatory Visit: Payer: Self-pay | Admitting: Family Medicine

## 2021-02-15 NOTE — Chronic Care Management (AMB) (Signed)
  Chronic Care Management   Note  02/15/2021 Name: Samuel Morris MRN: [id number] DOB: 1945/05/24  Samuel Morris is a 75 y.o. year old male who is a primary care patient of Valerie Roys, DO. Samuel Morris is currently enrolled in care management services. An additional referral for Pharm D  was placed.   Follow up plan: Telephone appointment with care management team member scheduled for:03/27/2021  Samuel Morris, [name], [name], [name] [address] Direct Dial: [phone] [email or web address] Website: [email or web address]

## 2021-02-15 NOTE — Telephone Encounter (Signed)
Requested Prescriptions  Pending Prescriptions Disp Refills  . ezetimibe (ZETIA) 10 MG tablet [Pharmacy Med Name: EZETIMIBE 10MG  TABLETS] 90 tablet 1    Sig: TAKE 1 TABLET(10 MG) BY MOUTH DAILY     Cardiovascular:  Antilipid - Sterol Transport Inhibitors Passed - 02/15/2021  8:26 AM      Passed - Total Cholesterol in normal range and within 360 days    Cholesterol, Total  Date Value Ref Range Status  10/11/2020 130 100 - 199 mg/dL Final         Passed - LDL in normal range and within 360 days    LDL Chol Calc (NIH)  Date Value Ref Range Status  10/11/2020 58 0 - 99 mg/dL Final         Passed - HDL in normal range and within 360 days    HDL  Date Value Ref Range Status  10/11/2020 54 >39 mg/dL Final         Passed - Triglycerides in normal range and within 360 days    Triglycerides  Date Value Ref Range Status  10/11/2020 98 0 - 149 mg/dL Final         Passed - Valid encounter within last 12 months    Recent Outpatient Visits          4 weeks ago NSTEMI (non-ST elevated myocardial infarction) [facility])   [place], [place] P, DO   3 months ago COVID-92   [place], Santiago Glad, NP   4 months ago Primary hypertension   [place], Megan P, DO   6 months ago Interstitial lung disease ([place])   [place], Megan P, DO   7 months ago [place], Lauren A, NP      Future Appointments            In 6 days [place], Areta Haber, PA-C [place], [facility]   In 1 month  [employer], [facility]   In 1 month [place], [place], DO [employer], [place]   In 2 months [place], [employer], DO [employer], [facility]

## 2021-02-15 NOTE — Telephone Encounter (Signed)
  Care Management   Follow Up Note   02/15/2021 Name: Samuel Morris MRN: [id number] DOB: [birth year]   Referred by: Valerie Roys, DO Reason for referral : Chronic Care Management (RNCM: Incoming call from [place] guide concerning patients wife concern when making outreach call to schedule pharmacy)   [place] reached out to the [facility] concerning the wife's expressed concern about the patients pain and needing assistance. Care guide advised the patients wife that the pharmacist could not advise about pain management but she would let the office know. In basket message sent to pcp for recommendations. The wife told the scheduling care guide they were going to have to go to the ER.   Follow Up Plan:  Will follow up with the patient as needed for [facility] services  [place], MSN, [facility] Mobile: [phone]

## 2021-02-16 NOTE — Telephone Encounter (Signed)
Pt states that his pain is coming from arthritis. He went to [facility] walk in and gave him a cortisone shot. He says that he is feeling better today. He declines appt.

## 2021-02-16 NOTE — Telephone Encounter (Signed)
I don't know what pain he's talking about. He would likely need an appointment.

## 2021-02-20 NOTE — Progress Notes (Signed)
Cardiology Office Note    Date:  02/21/2021   ID:  Samuel Morris, DOB 1946-01-16, MRN [id number]  PCP:  Valerie Roys, DO  Cardiologist:  Nelva Bush, MD  Electrophysiologist:  Vickie Epley, MD   Chief Complaint: Follow-up  History of Present Illness:   Samuel Morris is a 75 y.o. male with history of CAD with remote MI and subsequent CABG with LIMA to LAD and SVG to PDA at [facility] in 2018, HFrEF secondary to ICM complicated by ventricular tachycardia status post ICD implanted in 2012 with subsequent generator replacement in 2019, aortic stenosis status post bioprosthetic AVR in 2018 at [facility] with a 25 mm Edwards Magna, PAF, DM2, HTN, HLD, obesity, and OSA who presents for follow-up of his CAD, cardiomyopathy, and PAF.  He was previously followed by [facility] cardiology.  Echo in 05/2017 demonstrated an EF of 35%, mid inferior septal, apical, anterior apical segment akinesis within normal functioning aortic valve replacement and mild mitral regurgitation.  He had an ICD shock in 08/2019 due to VT.  He last saw [facility] cardiology in 05/2020 and was stable.  He was referred to pulmonology for a chronic cough at that time.    He established with EP within our office in 11/2020 with echo at that time showing a stable cardiomyopathy EF 30 to 35%, akinesis of the mid apical anteroseptal, anterior, and apical segments, grade 1 diastolic dysfunction, normal RV systolic function and ventricular cavity size, mildly elevated PASP, mildly dilated left atrium, prior aortic valve replacement with no evidence of stenosis and normal structure and function.  He was admitted to the hospital in 01/2021 with an NSTEMI.  High-sensitivity troponin peaked at 120.  LHC demonstrated severe multivessel CAD with CTO of the ostial/proximal LAD and mid RCA as well as moderate to severe disease involving D1, ramus intermedius, and LCx/OM branches.  Small but patent LIMA to LAD with 99% stenosis at the distal anastomosis  and TIMI I flow into a relatively small mid to distal LAD.  The SVG to PDA was widely patent.  The PDA did not backfill the PL branches which were chronically occluded and fills via left to right collaterals.  Severely elevated LVEDP estimated at 40 mmHg.  Bioprosthetic aortic valve was noted with a mild gradient.  It was recommended to diurese and escalate antianginal therapy with consideration for PCI of the distal LIMA to LAD anastomosis once his volume status was optimized, though it was noted intervention would be challenging given tortuosity/angulation of the involved vessels as well as relatively small size (approximately 2 mm in diameter).  Limited echo on 01/11/2021 showed an EF of 30 to 35%, normal RV systolic function, mild mitral regurgitation, and an estimated right atrial pressure of 3 mmHg.  Device interrogation on 01/15/2021 demonstrated A. fib with an episode lasting 13 minutes.  EP has referred the patient to the A. fib clinic for discussion of initiation of anticoagulation.  He was last seen in the office on 01/19/2021 and was feeling fairly well.  He has not had any further chest discomfort like what brought him to the ED the month prior.  He was feeling a bit more fatigued and also some intermittent lightheadedness.  His wife was concerned regarding some irregularity she noted in his heartbeat.  He denied any significant shortness of breath, palpitations, and edema.  His cath images were reviewed with interventional cardiology with both physicians agreeing that PCI would be challenging and should be reserved for refractory symptoms in  spite of aggressive antianginal therapy.  Redo bypass was not considered a great option given he still had a patent SVG to PDA along with other comorbid conditions.  Imdur was decreased to 15 mg daily.  Clopidogrel was discontinued and apixaban was added for newly diagnosed PAF.  He appears euvolemic on exam, though this was not consistent with his severely  elevated LVEDP at time of LHC.  He was continued on furosemide 40 mg daily.  Soft BP precluded escalation of GDMT.  He comes in today doing well from a cardiac perspective.  No chest pain, dyspnea, palpitations, dizziness, presyncope, or syncope.  Lightheadedness has improved following the decrease of isosorbide.  Blood pressure is typically in the 120s over 60s at home.  He has tolerated the addition of apixaban without issues.  No falls, hematochezia, or melena.  No lower extremity swelling, orthopnea (sleeps on a very thin feather pillow), abdominal distention, PND, early satiety.  He does watch his salt intake, not adding salt to foods and drinks approximately 2 L of fluid, which is significantly less than what he was previously drinking.  His weight is down 7 pounds today when compared to his last clinic visit.  He indicates he is snacking less.   Labs independently reviewed: 01/2021 - Hgb 14.2, PLT 285, BUN 26, serum creatinine 1.13, potassium 4.5, albumin 4.4, AST/ALT normal, A1c 6.8 10/2020 - TC 130, TG 98, HDL 54, LDL 58 10/2018 - TSH normal  Past Medical History:  Diagnosis Date   Arrhythmia    paroxysmal atrial fibrillation   Cancer (HCC)    Scalp   Cataract    CHF (congestive heart failure) ([place])    Diabetes mellitus without complication ([place])    Heart attack ([place])    Heart disease    Hyperlipidemia    Hypertension    Sleep apnea    Stenosis of aorta    Ventricular tachycardia     Past Surgical History:  Procedure Laterality Date   [place] CATH AND CORS/GRAFTS ANGIOGRAPHY N/A 01/10/2021   Procedure: LEFT HEART CATH AND CORS/GRAFTS ANGIOGRAPHY;  Surgeon: Nelva Bush, MD;  Location: [place] [facility];  Service: Cardiovascular;  Laterality: N/A;    Current Medications: Current Meds  Medication Sig   albuterol (VENTOLIN HFA) 108 (90 Base) MCG/ACT inhaler Inhale 1 puff into the  lungs every 4 (four) hours as needed for wheezing or shortness of breath.   apixaban (ELIQUIS) 5 MG TABS tablet Take 1 tablet (5 mg total) by mouth 2 (two) times daily.   Ascorbic Acid (VITAMIN C PO) Take 100 mg by mouth daily.   aspirin EC 81 MG tablet Take 81 mg by mouth daily.   Calcium-Magnesium 100-50 MG TABS Take 1 tablet by mouth daily.   Cholecalciferol (VITAMIN D-1000 MAX ST) 25 MCG (1000 UT) tablet Take 1,000 Units by mouth daily.   dapagliflozin propanediol (FARXIGA) 5 MG TABS tablet Take 5 mg by mouth daily.   diclofenac Sodium (VOLTAREN) 1 % GEL SMARTSIG:4 Gram(s) Topical Twice Daily   ENTRESTO 49-51 MG Take 1 tablet by mouth 2 (two) times daily.   ezetimibe (ZETIA) 10 MG tablet TAKE 1 TABLET(10 MG) BY MOUTH DAILY   furosemide (LASIX) 40 MG tablet Take 1 tablet (40 mg total) by mouth daily.   hydroxychloroquine (PLAQUENIL) 200 MG tablet Take 200 mg by mouth 2 (two) times daily.   isosorbide  mononitrate (IMDUR) 30 MG 24 hr tablet Take 0.5 tablets (15 mg total) by mouth daily.   metoprolol succinate (TOPROL-XL) 100 MG 24 hr tablet Take 100 mg by mouth daily. Take with or immediately following a meal.   nitroGLYCERIN (NITROSTAT) 0.4 MG SL tablet Place 1 tablet (0.4 mg total) under the tongue every 5 (five) minutes as needed for chest pain.   rosuvastatin (CRESTOR) 20 MG tablet Take 1 tablet (20 mg total) by mouth at bedtime.   spironolactone (ALDACTONE) 25 MG tablet Take 1 tablet (25 mg total) by mouth daily.   Turmeric (QC TUMERIC COMPLEX PO) Take by mouth daily. Unsure of dose   vitamin B-12 (CYANOCOBALAMIN) 1000 MCG tablet Take by mouth daily.    Allergies:   Patient has no known allergies.   Social History   Socioeconomic History   Marital status: Married    Spouse name: Not on file   Number of children: Not on file   Years of education: Not on file   Highest education level: Not on file  Occupational History   Occupation: retired  Tobacco Use   Smoking status: Never    Smokeless tobacco: Never  Vaping Use   Vaping Use: Never used  Substance and Sexual Activity   Alcohol use: Yes    Comment: On occasion   Drug use: Never   Sexual activity: Yes    Birth control/protection: None  Other Topics Concern   Not on file  Social History Narrative   Not on file   Social Determinants of Health   Financial Resource Strain: Low Risk    Difficulty of Paying Living Expenses: Not hard at all  Food Insecurity: No Food Insecurity   Worried About Charity fundraiser in the Last Year: Never true   [place] in the Last Year: Never true  Transportation Needs: No Transportation Needs   Lack of Transportation (Medical): No   Lack of Transportation (Non-Medical): No  Physical Activity: Inactive   Days of Exercise per Week: 0 days   Minutes of Exercise per Session: 0 min  Stress: No Stress Concern Present   Feeling of Stress : Not at all  Social Connections: Not on file     Family History:  The patient's family history includes Arthritis in his sister; Heart attack in his father; Heart disease in his mother and paternal grandmother; Heart murmur in his sister; Hypertension in his brother and sister; Prostate cancer in his brother.  ROS:   Review of Systems  Constitutional:  Positive for malaise/fatigue. Negative for chills, diaphoresis, fever and weight loss.  HENT:  Negative for congestion.   Eyes:  Negative for discharge and redness.  Respiratory:  Negative for cough, sputum production, shortness of breath and wheezing.   Cardiovascular:  Negative for chest pain, palpitations, orthopnea, claudication, leg swelling and PND.  Gastrointestinal:  Negative for blood in stool and melena.  Musculoskeletal:  Positive for joint pain. Negative for falls and myalgias.  Skin:  Negative for rash.  Neurological:  Negative for dizziness, tingling, tremors, sensory change, speech change, focal weakness, loss of consciousness and weakness.  Endo/Heme/Allergies:   Bruises/bleeds easily.  Psychiatric/Behavioral:  Negative for substance abuse. The patient is not nervous/anxious.   All other systems reviewed and are negative.   EKGs/Labs/Other Studies Reviewed:    Studies reviewed were summarized above. The additional studies were reviewed today:  Limited echo 01/11/2021: 1. Left ventricular ejection fraction, by estimation, is 30 to 35%. The  left  ventricle has moderately decreased function. Left ventricular  endocardial border not optimally defined to evaluate regional wall motion.   2. Right ventricular systolic function is normal.   3. Mild mitral valve regurgitation.   4. The aortic valve is grossly normal. Aortic valve regurgitation is not  visualized.   5. The inferior vena cava is normal in size with greater than 50%  respiratory variability, suggesting right atrial pressure of 3 mmHg.  __________   LHC 01/10/2021: Conclusions: Severe multivessel coronary artery disease with chronic total occlusions of the ostial/proximal LAD and mid RCA as well as moderate-severe disease involving D1, ramus intermedius, and LCx/OM branches. Small but patent LIMA-LAD with 99% stenosis at distal anastomosis and TIMI I flow into relatively small mid/distal LAD. Widely patent SVG-PDA.  PDA does not backfill the PL branches, which are chronically occluded and fill via left-right collaterals. Severely elevated left ventricular filling pressure (LVEDP 40 mmHg). Bioprosthetic aortic valve with mild gradient (peak-to-peak gradient 10-15 mmHg).   Recommendations: Diuresis and escalate antianginal therapy.  Will discontinue IV nitroglycerin and add isosorbide mononitrate 30 mg daily. Consider PCI distal LIMA-LAD anastomosis once volume status is optimized (inpatient versus outpatient to be determined by symptoms and hospital course), though intervention will be challenging given tortuosity/angulation of the involved vessels as well as relatively small size  (approximately 2 mm in diameter). Loaded with clopidogrel with plans for dual antiplatelet therapy with aspirin and clopidogrel for 12 months. Aggressive secondary prevention of coronary artery disease and continued goal-directed medical therapy of acute on chronic HFrEF due to ischemic cardiomyopathy. __________   2D echo 11/28/2020: 1. Left ventricular ejection fraction, by estimation, is 30 to 35%. The  left ventricle has moderately decreased function. The left ventricle  demonstrates regional wall motion abnormalities (see scoring  diagram/findings for description). Left ventricular   diastolic parameters are consistent with Grade I diastolic dysfunction  (impaired relaxation). There is akinesis of the left ventricular,  mid-apical anteroseptal wall, anterior wall and apical segment.   2. Right ventricular systolic function is normal. The right ventricular  size is normal. There is mildly elevated pulmonary artery systolic  pressure.   3. Left atrial size was mildly dilated.   4. The mitral valve is normal in structure. No evidence of mitral valve  regurgitation. No evidence of mitral stenosis.   5. The aortic valve has been repaired/replaced. Aortic valve  regurgitation is not visualized. No aortic stenosis is present. There is a  bovine valve present in the aortic position. Echo findings are consistent  with normal structure and function of the  aortic valve prosthesis. Aortic valve mean gradient measures 10.0 mmHg.   6. The inferior vena cava is dilated in size with >50% respiratory  variability, suggesting right atrial pressure of 8 mmHg.   Comparison(s): Previous Echo at [place] showed LV EF 35%, Grade I  diastolic dysfunction, mid anteroseptal wall hypokinesis, mid  inferoseptal, apical septal and apical anterior wall akinesis. NO AI or  AS. Bovine bioprosthetic AoV functioning normally.     EKG:  EKG is ordered today.  The EKG ordered today demonstrates NSR, 83 bpm,  inferior Q waves, possible prior anteroseptal infarct, lateral T wave inversion overall largely unchanged when compared to prior tracings  Recent Labs: 01/17/2021: ALT 28; BUN 26; Creatinine, Ser 1.13; Hemoglobin 14.6; Platelets 284; Potassium 4.5; Sodium 136  Recent Lipid Panel    Component Value Date/Time   CHOL 130 10/11/2020 0833   TRIG 98 10/11/2020 0833   HDL 54  10/11/2020 0833   LDLCALC 58 10/11/2020 0833    PHYSICAL EXAM:    VS:  BP 134/80 (BP Location: Left Arm, Patient Position: Sitting, Cuff Size: Large)   Pulse 83   Ht 5\' 5"  (1.651 m)   Wt 185 lb (83.9 kg)   SpO2 98%   BMI 30.79 kg/m   BMI: Body mass index is 30.79 kg/m.  Physical Exam Vitals reviewed.  Constitutional:      Appearance: He is well-developed.  HENT:     Head: Normocephalic and atraumatic.  Eyes:     General:        Right eye: No discharge.        Left eye: No discharge.  Neck:     Vascular: No JVD.  Cardiovascular:     Rate and Rhythm: Normal rate and regular rhythm.     Pulses:          Posterior tibial pulses are 2+ on the right side and 2+ on the left side.     Heart sounds: Normal heart sounds, S1 normal and S2 normal. Heart sounds not distant. No midsystolic click and no opening snap. No murmur heard.   No friction rub.  Pulmonary:     Effort: Pulmonary effort is normal. No respiratory distress.     Breath sounds: Normal breath sounds. No decreased breath sounds, wheezing or rales.  Chest:     Chest wall: No tenderness.  Abdominal:     General: There is no distension.     Palpations: Abdomen is soft.     Tenderness: There is no abdominal tenderness.  Musculoskeletal:     Cervical back: Normal range of motion.     Right lower leg: No edema.     Left lower leg: No edema.  Skin:    General: Skin is warm and dry.     Nails: There is no clubbing.  Neurological:     Mental Status: He is alert and oriented to person, place, and time.  Psychiatric:        Speech: Speech normal.         Behavior: Behavior normal.        Thought Content: Thought content normal.        Judgment: Judgment normal.    Wt Readings from Last 3 Encounters:  02/21/21 185 lb (83.9 kg)  02/01/21 192 lb 9.6 oz (87.4 kg)  01/24/21 192 lb 8 oz (87.3 kg)     ASSESSMENT & PLAN:   CAD status post CABG with recent NSTEMI: He comes in doing well and is without any symptoms concerning for angina.  Continue medical therapy with potential high risk PCI of the LIMA to LAD anastomotic lesion reserved for refractory symptoms despite optimization of antianginal therapy.  He has not been felt to be a candidate for redo bypass given patent SVG to PDA and other comorbid conditions.  Continue aggressive risk factor modification and current medical therapy including aspirin, Imdur, Toprol-XL, rosuvastatin, ezetimibe, and as needed SL NTG.  No longer uncovertebral given recently started apixaban as outlined below and at his office visit on 01/19/2021.  No plans for repeat ischemic testing at this time.  HFrEF secondary to ICM: He appears euvolemic and well compensated with a weight that is down 7 pounds today when compared to his last clinic visit.  He attributes this to decreased snacking.  At his last visit he noted lightheadedness prompting the de-escalation of Imdur with improvement in symptoms.  Given this, we will  defer escalation of GDMT at this time.  He remains on Toprol-XL, Entresto, spironolactone, Farxiga, and furosemide.  Should he redevelop heart failure symptoms despite optimization of evidence-based medical therapy, could consider cardiac MRI to assess for myocardial viability.  EP has weighed in and indicated his MRI is MR conditional.  Otherwise, would need to pursue PET viability study at [facility].  CHF education.  PAF: Maintaining sinus rhythm on Toprol-XL.  CHA2DS2-VASc at least 6 (CHF, HTN, age x2, DM, vascular disease).  Tolerating apixaban 5 mg twice daily (does not meet reduced dosing criteria) without any  symptoms concerning for bleeding.  Follow-up with EP.  Aortic valve disease status post bioprosthetic AVR: Recent echocardiograms and cardiac catheterization demonstrated appropriate valve function.  SBE prophylaxis is recommended and discussed with the patient.  History of VT status post ICD: No device alerts or shocks.  He remains on Toprol.  Follow-up with EP.  HTN: Blood pressure is reasonably controlled in the office today.  Escalation of GDMT was deferred given lightheadedness at last visit prompting de-escalation of Imdur.  Continue medical therapy as above.  HLD: LDL 58 in 10/2020.  He remains on rosuvastatin and ezetimibe.    Disposition: F/u with Dr. Saunders Revel or an APP in 2 months.   Medication Adjustments/Labs and Tests Ordered: Current medicines are reviewed at length with the patient today.  Concerns regarding medicines are outlined above. Medication changes, Labs and Tests ordered today are summarized above and listed in the Patient Instructions accessible in Encounters.   Signed, Christell Faith, PA-C 02/21/2021 12:55 PM     [address], [place] [id number] [phone]

## 2021-02-21 ENCOUNTER — Encounter: Payer: Self-pay | Admitting: Physician Assistant

## 2021-02-21 ENCOUNTER — Other Ambulatory Visit: Payer: Self-pay

## 2021-02-21 ENCOUNTER — Ambulatory Visit (INDEPENDENT_AMBULATORY_CARE_PROVIDER_SITE_OTHER): Payer: Medicare Other | Admitting: Physician Assistant

## 2021-02-21 VITALS — BP 134/80 | HR 83 | Ht 65.0 in | Wt 185.0 lb

## 2021-02-21 DIAGNOSIS — E785 Hyperlipidemia, unspecified: Secondary | ICD-10-CM

## 2021-02-21 DIAGNOSIS — I10 Essential (primary) hypertension: Secondary | ICD-10-CM

## 2021-02-21 DIAGNOSIS — I480 Paroxysmal atrial fibrillation: Secondary | ICD-10-CM | POA: Diagnosis not present

## 2021-02-21 DIAGNOSIS — I2510 Atherosclerotic heart disease of native coronary artery without angina pectoris: Secondary | ICD-10-CM | POA: Diagnosis not present

## 2021-02-21 DIAGNOSIS — Z95810 Presence of automatic (implantable) cardiac defibrillator: Secondary | ICD-10-CM

## 2021-02-21 DIAGNOSIS — I255 Ischemic cardiomyopathy: Secondary | ICD-10-CM

## 2021-02-21 DIAGNOSIS — Z952 Presence of prosthetic heart valve: Secondary | ICD-10-CM

## 2021-02-21 DIAGNOSIS — I359 Nonrheumatic aortic valve disorder, unspecified: Secondary | ICD-10-CM

## 2021-02-21 DIAGNOSIS — I5022 Chronic systolic (congestive) heart failure: Secondary | ICD-10-CM

## 2021-02-21 DIAGNOSIS — I472 Ventricular tachycardia, unspecified: Secondary | ICD-10-CM

## 2021-02-21 NOTE — Patient Instructions (Signed)
Medication Instructions:  No changes at this time.  *If you need a refill on your cardiac medications before your next appointment, please call your pharmacy*   Lab Work: None  If you have labs (blood work) drawn today and your tests are completely normal, you will receive your results only by: [place] (if you have MyChart) OR A paper copy in the mail If you have any lab test that is abnormal or we need to change your treatment, we will call you to review the results.   Testing/Procedures: None   Follow-Up: At [facility], you and your health needs are our priority.  As part of our continuing mission to provide you with exceptional heart care, we have created designated Provider Care Teams.  These Care Teams include your primary Cardiologist (physician) and Advanced Practice Providers (APPs -  Physician Assistants and Nurse Practitioners) who all work together to provide you with the care you need, when you need it.   Your next appointment:   2 month(s)  The format for your next appointment:   In Person  Provider:   Nelva Bush, MD or Christell Faith, PA-C

## 2021-03-08 ENCOUNTER — Other Ambulatory Visit: Payer: Self-pay | Admitting: Internal Medicine

## 2021-03-08 NOTE — Telephone Encounter (Signed)
Prescription refill request for Eliquis received. Indication: Atrial Fib Last office visit: 02/21/21  R Dunn PA-C Scr: 1.13 on 01/17/21 Age: 75 Weight: 83.9kg  Based on above findings Eliquis 5mg  twice daily is the appropriate dose.  Refill approved.

## 2021-03-08 NOTE — Telephone Encounter (Signed)
Refill Request.  

## 2021-03-23 ENCOUNTER — Telehealth: Payer: Self-pay | Admitting: Family Medicine

## 2021-03-23 ENCOUNTER — Other Ambulatory Visit: Payer: Self-pay | Admitting: Family Medicine

## 2021-03-23 DIAGNOSIS — E1159 Type 2 diabetes mellitus with other circulatory complications: Secondary | ICD-10-CM

## 2021-03-23 MED ORDER — DAPAGLIFLOZIN PROPANEDIOL 5 MG PO TABS: 5.0000 mg | ORAL_TABLET | Freq: Every day | ORAL | 0 refills | Status: DC

## 2021-03-23 NOTE — Telephone Encounter (Signed)
Medication Refill - farxiga   Has the patient contacted their pharmacy? No. (Agent: If no, request that the patient contact the pharmacy for the refill. If patient does not wish to contact the pharmacy document the reason why and proceed with request.) Wants to see if it could be switched to cheaper brand because it's 500 dollars   Preferred Pharmacy (with phone number or street name):  [id number] - [place], [address]  Phone:  [phone] Fax:  [phone]     Has the patient been seen for an appointment in the last year OR does the patient have an upcoming appointment? Yes.    Agent: Please be advised that RX refills may take up to 3 business days. We ask that you follow-up with your pharmacy.

## 2021-03-23 NOTE — Telephone Encounter (Signed)
Copied from [place] [phone]. Topic: Referral - Request for Referral >> Mar 23, 2021  2:11 PM Oneida Alar, Museum/gallery conservator R wrote: Has patient seen PCP for this complaint? Yes.   *If NO, is insurance requiring patient see PCP for this issue before PCP can refer them? Referral for which specialty: endocrinologist Preferred provider/office: n/a Reason for referral: discussed with pcp

## 2021-03-23 NOTE — Telephone Encounter (Signed)
Requested medication (s) are due for refill today: yes  Requested medication (s) are on the active medication list: yes  Last refill:  historical  Future visit scheduled: yes  Notes to clinic:  historical med/ provider. Please advise     Requested Prescriptions  Pending Prescriptions Disp Refills   dapagliflozin propanediol (FARXIGA) 5 MG TABS tablet 30 tablet     Sig: Take 1 tablet (5 mg total) by mouth daily.     Endocrinology:  Diabetes - SGLT2 Inhibitors Passed - 03/23/2021  2:18 PM      Passed - Cr in normal range and within 360 days    Creatinine, Ser  Date Value Ref Range Status  01/17/2021 1.13 0.76 - 1.27 mg/dL Final          Passed - LDL in normal range and within 360 days    LDL Chol Calc (NIH)  Date Value Ref Range Status  10/11/2020 58 0 - 99 mg/dL Final          Passed - HBA1C is between 0 and 7.9 and within 180 days    Hemoglobin A1C  Date Value Ref Range Status  12/20/2017 6.5  Final   HB A1C (BAYER DCA - WAIVED)  Date Value Ref Range Status  10/11/2020 6.9 <7.0 % Final    Comment:                                          Diabetic Adult            <7.0                                       Healthy Adult        4.3 - 5.7                                                           (DCCT/NGSP) American Diabetes Association's Summary of Glycemic Recommendations for Adults with Diabetes: Hemoglobin A1c <7.0%. More stringent glycemic goals (A1c <6.0%) may further reduce complications at the cost of increased risk of hypoglycemia.    Hgb A1c MFr Bld  Date Value Ref Range Status  01/12/2021 6.8 (H) 4.8 - 5.6 % Final    Comment:    (NOTE) Pre diabetes:          5.7%-6.4%  Diabetes:              >6.4%  Glycemic control for   <7.0% adults with diabetes           Passed - eGFR in normal range and within 360 days    GFR calc Af Amer  Date Value Ref Range Status  04/11/2020 96 >59 mL/min/1.73 Final    Comment:    **In accordance with  recommendations from the NKF-ASN Task force,**   [facility] is in the process of updating its eGFR calculation to the   2021 CKD-EPI creatinine equation that estimates kidney function   without a race variable.    GFR, Estimated  Date Value Ref Range Status  01/12/2021 >60 >60 mL/min Final    Comment:    (  NOTE) Calculated using the CKD-EPI Creatinine Equation (2021)    eGFR  Date Value Ref Range Status  01/17/2021 67 >59 mL/min/1.73 Final          Passed - Valid encounter within last 6 months    Recent Outpatient Visits           2 months ago NSTEMI (non-ST elevated myocardial infarction) [facility])   H[place], Megan P, DO   4 months ago COVID-74   [place], Santiago Glad, NP   5 months ago Primary hypertension   [place], Megan P, DO   7 months ago Interstitial lung disease ([place])   [place], Megan P, DO   8 months ago [place], Scheryl Darter, NP       Future Appointments             In 1 week  [place], [place]   In 3 weeks Wynetta Emery, Barb Merino, DO [employer], [place]   In 3 weeks Wynetta Emery, Barb Merino, DO [employer], [place]   In 1 month Dunn, Areta Haber, PA-C [place], [facility]

## 2021-03-23 NOTE — Addendum Note (Signed)
Addended by: Valerie Roys on: 03/23/2021 02:59 PM   Modules accepted: Orders

## 2021-03-27 ENCOUNTER — Ambulatory Visit (INDEPENDENT_AMBULATORY_CARE_PROVIDER_SITE_OTHER): Payer: Medicare Other

## 2021-03-27 DIAGNOSIS — I10 Essential (primary) hypertension: Secondary | ICD-10-CM

## 2021-03-27 DIAGNOSIS — E1169 Type 2 diabetes mellitus with other specified complication: Secondary | ICD-10-CM

## 2021-03-27 DIAGNOSIS — E1159 Type 2 diabetes mellitus with other circulatory complications: Secondary | ICD-10-CM

## 2021-03-27 NOTE — Progress Notes (Signed)
Chronic Care Management Pharmacy Note  03/29/2021 Name:  Samuel Morris MRN:  [id number] DOB:  [birth year]  Summary: Samuel Morris assistance Voucher information provided to pharmacy - successfully processed 30 DS of Farxiga at $0.  Farxiga PAP - HC to fax to front office. Once faxed, call and let pt know so he can come pick up.  Subjective: Samuel Morris is an 76 y.o. year old male who is a primary patient of Valerie Roys, DO.  The CCM team was consulted for assistance with disease management and care coordination needs.    Engaged with patient by telephone for follow up visit in response to provider referral for pharmacy case management and/or care coordination services.   Consent to Services:  The patient was given information about Chronic Care Management services, agreed to services, and gave verbal consent prior to initiation of services.  Please see initial visit note for detailed documentation.   Patient Care Team: Valerie Roys, DO as PCP - General (Family Medicine) Vickie Epley, MD as PCP - Electrophysiology (Cardiology) End, Harrell Gave, MD as PCP - Cardiology (Cardiology) Madelin Rear, [facility] as Pharmacist (Pharmacist)  Objective:  Lab Results  Component Value Date   CREATININE 1.13 01/17/2021   CREATININE 0.92 01/12/2021   CREATININE 0.96 01/11/2021    Lab Results  Component Value Date   HGBA1C 6.8 (H) 01/12/2021   HGBA1C 6.9 10/11/2020   HGBA1C 6.0 04/11/2020   Last diabetic Eye exam:  Lab Results  Component Value Date/Time   HMDIABEYEEXA No Retinopathy 05/19/2020 12:00 AM    Last diabetic Foot exam: No results found for: HMDIABFOOTEX      Component Value Date/Time   CHOL 130 10/11/2020 0833   CHOL 131 04/11/2020 1006   CHOL 131 09/10/2019 0848   TRIG 98 10/11/2020 0833   TRIG 92 04/11/2020 1006   TRIG 100 09/10/2019 0848   HDL 54 10/11/2020 0833   HDL 37 (L) 04/11/2020 1006   HDL 50 09/10/2019 0848   LDLCALC 58 10/11/2020 0833   LDLCALC 76  04/11/2020 1006   LDLCALC 62 09/10/2019 0848    Hepatic Function Latest Ref Rng & Units 01/17/2021 10/11/2020 04/11/2020  Total Protein 6.0 - 8.5 g/dL 7.0 5.8(L) 7.3  Albumin 3.7 - 4.7 g/dL 4.4 4.2 4.7  AST 0 - 40 IU/L _0 ALT 0 - 44 IU/L 28 29 39  Alk Phosphatase 44 - 121 IU/L 101 75 112  Total Bilirubin 0.0 - 1.2 mg/dL 0.5 0.4 0.7    Lab Results  Component Value Date/Time   TSH 3.240 11/06/2018 09:19 AM    CBC Latest Ref Rng & Units 01/17/2021 01/10/2021 10/11/2020  WBC 3.4 - 10.8 x10E3/uL 13.6(H) 10.2 11.4(H)  Hemoglobin 13.0 - 17.7 g/dL 14.6 14.5 14.0  Hematocrit 37.5 - 51.0 % 44.6 44.5 42.1  Platelets 150 - 450 x10E3/uL 284 249 214    No results found for: VD25OH  Clinical ASCVD:  The ASCVD Risk score (Arnett DK, et al., 2019) failed to calculate for the following reasons:   The patient has a prior MI or stroke diagnosis   Social History   Tobacco Use  Smoking Status Never  Smokeless Tobacco Never   BP Readings from Last 3 Encounters:  02/21/21 134/80  02/01/21 126/70  01/24/21 127/77   Pulse Readings from Last 3 Encounters:  02/21/21 83  02/01/21 83  01/24/21 87   Wt Readings from Last 3 Encounters:  02/21/21 185 lb (83.9 kg)  02/01/21 192 lb 9.6  oz (87.4 kg)  01/24/21 192 lb 8 oz (87.3 kg)   BMI Readings from Last 3 Encounters:  02/21/21 30.79 kg/m  02/01/21 29.28 kg/m  01/24/21 29.27 kg/m    Assessment: Review of patient past medical history, allergies, medications, health status, including review of consultants reports, laboratory and other test data, was performed as part of comprehensive evaluation and provision of chronic care management services.   SDOH:  (Social Determinants of Health) assessments and interventions performed:    CCM Care Plan  No Known Allergies  Medications Reviewed Today     Reviewed by Felipa Furnace, DPM (Physician) on 03/28/21 at 1010  Med List Status: <None>   Medication Order Taking? Sig Documenting Provider  Last Dose Status Informant  albuterol (VENTOLIN HFA) 108 (90 Base) MCG/ACT inhaler [id number] No Inhale 1 puff into the lungs every 4 (four) hours as needed for wheezing or shortness of breath. Myles Gip, DO Taking Active Self  apixaban (ELIQUIS) 5 MG TABS tablet [id number]  TAKE 1 TABLET BY MOUTH TWICE A DAY End, Christopher, MD  Active   Ascorbic Acid (VITAMIN C PO) [id number] No Take 100 mg by mouth daily. [provider] Taking Active Self  aspirin EC 81 MG tablet [id number] No Take 81 mg by mouth daily. [provider] Taking Active Self  Calcium-Magnesium 100-50 MG TABS [id number] No Take 1 tablet by mouth daily. [provider] Taking Active Self  Cholecalciferol (VITAMIN D-1000 MAX ST) 25 MCG (1000 UT) tablet [id number] No Take 1,000 Units by mouth daily. [provider] Taking Active Self  dapagliflozin propanediol (FARXIGA) 5 MG TABS tablet [id number]  Take 1 tablet (5 mg total) by mouth daily. Wynetta Emery, Megan P, DO  Active   diclofenac Sodium (VOLTAREN) 1 % GEL [id number] No SMARTSIG:4 Gram(s) Topical Twice Daily [provider] Taking Active Self  ENTRESTO 49-51 MG [id number] No Take 1 tablet by mouth 2 (two) times daily. [provider] Taking Active Self           Med Note De [place]   Fri Dec 26, 2018  2:29 PM)    ezetimibe (ZETIA) 10 MG tablet [id number] No TAKE 1 TABLET(10 MG) BY MOUTH DAILY Johnson, Megan P, DO Taking Active   furosemide (LASIX) 40 MG tablet [id number] No Take 1 tablet (40 mg total) by mouth daily. Shawna Clamp, MD Taking Active   hydroxychloroquine (PLAQUENIL) 200 MG tablet [id number] No Take 200 mg by mouth 2 (two) times daily. [provider] Taking Active Self  isosorbide mononitrate (IMDUR) 30 MG 24 hr tablet [id number] No Take 0.5 tablets (15 mg total) by mouth daily. End, Harrell Gave, MD Taking Active   metoprolol succinate (TOPROL-XL) 100 MG 24 hr tablet [id number] No Take 100 mg by mouth  daily. Take with or immediately following a meal. [provider] Taking Active   nitroGLYCERIN (NITROSTAT) 0.4 MG SL tablet [id number] No Place 1 tablet (0.4 mg total) under the tongue every 5 (five) minutes as needed for chest pain. Minna Merritts, MD Taking Active   rosuvastatin (CRESTOR) 20 MG tablet [id number] No Take 1 tablet (20 mg total) by mouth at bedtime. Park Liter P, DO Taking Active Self  spironolactone (ALDACTONE) 25 MG tablet [id number] No Take 1 tablet (25 mg total) by mouth daily. Valerie Roys, DO Taking Active Self  Turmeric (QC TUMERIC COMPLEX PO) [id number] No Take by mouth daily. Unsure of dose [provider] Taking Active Self  vitamin B-12 (CYANOCOBALAMIN) 1000 MCG tablet  [id number] No Take by mouth daily. [provider] Taking Active Self            Patient Active Problem List   Diagnosis Date Noted   NSTEMI (non-ST elevated myocardial infarction) ([place]) 01/10/2021   COVID-19 11/16/2020   Senile purpura ([place]) 10/11/2020   Rheumatoid arthritis ([place]) 07/09/2020   Interstitial lung disease ([place]) 07/09/2020   CAD (coronary artery disease) 11/05/2018   Hyperlipidemia associated with type 2 diabetes mellitus ([place]) 11/05/2018   Type 2 diabetes mellitus with cardiac complication ([place]) 10/62/6948   OSA (obstructive sleep apnea) 11/05/2018   HTN (hypertension) 11/05/2018   Aortic stenosis 11/05/2018   Presence of automatic (implantable) cardiac defibrillator 11/05/2018   Hx of melanoma of skin 11/05/2018   Angina pectoris ([place]) 11/05/2018   Pulmonary nodules 11/05/2018   Dilated cardiomyopathy ([place]) 11/05/2018   Adhesive capsulitis of both shoulders 03/16/2018   AVN (avascular necrosis of bone) ([place]) 03/16/2018   Chronic pain of both shoulders 03/16/2018   Kyphoscoliosis deformity of spine 03/16/2018   Bilateral cataracts 01/20/2018   Overweight 12/18/2017   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 11/12/2017    Hyperlipidemia 06/16/2017   DJD of right shoulder 04/24/2017   Right knee DJD 04/24/2017   S/P AVR (aortic valve replacement) 12/17/2016   VT (ventricular tachycardia) 07/27/2016   Ischemic cardiomyopathy 07/11/2016   Murmur 07/11/2016   H/O acute myocardial infarction 07/09/2016    Immunization History  Administered Date(s) Administered   Fluad Quad(high Dose 65+) 11/06/2018, 01/04/2021   Influenza, High Dose Seasonal PF 12/11/2016, 03/14/2018   PFIZER(Purple Top)SARS-COV-2 Vaccination 05/04/2019, 05/29/2019   Pneumococcal Conjugate-13 12/14/2013   Pneumococcal Polysaccharide-23 12/01/2014   Td 10/11/2020    Conditions to be addressed/monitored: T2DM, CHF, CAD s/p MI; OSA, HLD   Care Plan : ccm pharmacy care plan  Updates made by Madelin Rear, [facility] since 03/29/2021 12:00 AM     Problem: T2DM, CHF, CAD s/p MI; OSA, HLD   Priority: High  Onset Date: 03/29/2021     Long-Range Goal: disease management   Start Date: 03/29/2021  This Visit's Progress: On track  Priority: High  Note:   Current Barriers:  Unable to independently afford treatment regimen  Pharmacist Clinical Goal(s):  Patient will verbalize ability to afford treatment regimen through collaboration with PharmD and provider.   Interventions: 1:1 collaboration with Valerie Roys, DO regarding development and update of comprehensive plan of care as evidenced by provider attestation and co-signature Inter-disciplinary care team collaboration (see longitudinal plan of care) Comprehensive medication review performed; medication list updated in electronic medical record  Hypertension (BP goal <130/80) -Controlled -Current treatment: Imdur - Appropriate, Effective, Safe, Accessible Metoprolol succinate  - Appropriate, Effective, Safe, Accessible Spironolactone  - Appropriate, Effective, Safe, Accessible Furosemide  - Appropriate, Effective, Safe, Accessible Entresto - Appropriate, Effective, Safe,  Accessible -Denies hypotensive/hypertensive symptoms -Educated on BP goals and benefits of medications for prevention of heart attack, stroke and kidney damage; Exercise goal of 150 minutes per week; Importance of home blood pressure monitoring; -Counseled to monitor BP at home 1-2x/wk, document, and provide log at future appointments -Counseled on diet and exercise extensively Recommended to continue current medication  Hyperlipidemia: (LDL goal < 70) -Controlled -Current treatment: Crestor 20 mg once daily  - Appropriate, Effective, Safe, Accessible Zetia 10 mg once daily  - Appropriate, Effective, Safe, Accessible -Side effect review - tolerating well, no problems -Educated on Cholesterol goals;  Benefits of statin for ASCVD risk reduction; -Recommended to continue current  medication  Diabetes (A1c goal <7%) -Controlled -Current medications: Farxiga 5 mg - not taking yet.  -Medications previously tried: m -Current home glucose readings fasting glucose: 100-120s post prandial glucose: n/a -Denies hypoglycemic/hyperglycemic symptoms -Reviewed patient assistance - should qualify for farxiga pap - dapagliflozin propanediol (FARXIGA) 5 MG TABS tablet - HC to send patient an application - prefers to pick up in office - send via fax and request for office to place in patient folder for pick up  -Recommended to continue current medication  Patient Goals/Self-Care Activities Patient will:  - take medications as prescribed as evidenced by patient report and record review  Medication Assistance: Application for farxiga  medication assistance program. in process.  Anticipated assistance start date 04/2021.  See plan of care for additional detail.     Patient's preferred pharmacy is:  [id number] [place] [id number]: [phone]: [id number]: [phone]: [phone]  Pt endorses 100% compliance  Follow Up:  Patient agrees to Care Plan and Follow-up. Plan: [place] to [facility] for pt pick up. 1 month HC call to check on completion and obtain bG readings. Pharmacist 6 month telephone   Future Appointments  Date Time Provider [place] 04/03/2021  9:45 AM [facility]  04/13/2021  8:20 AM JPark LiterP, DO [facility]  04/14/2021  7:25 AM [facility] DEVICE REMOTES [facility]  04/19/2021  8:00 AM JPark LiterP, DO [facility]  04/26/2021 10:30 AM DRise Mu PA-C [facility]  06/27/2021  9:15 AM PFelipa Furnace DPM [facility]  07/14/2021  7:25 AM [facility] DEVICE REMOTES [facility]  09/18/2021  3:00 PM CFP CCM PHARMACY [facility]  10/13/2021  7:25 AM [facility] DEVICE REMOTES [facility]  01/12/2022  7:25 AM [facility] DEVICE REMOTES [facility]   JMadelin Rear PharmD, BCGP Clinical Pharmacist  [facility]  ([phone]

## 2021-03-28 ENCOUNTER — Other Ambulatory Visit: Payer: Self-pay

## 2021-03-28 ENCOUNTER — Ambulatory Visit (INDEPENDENT_AMBULATORY_CARE_PROVIDER_SITE_OTHER): Payer: Medicare Other | Admitting: Podiatry

## 2021-03-28 ENCOUNTER — Encounter: Payer: Self-pay | Admitting: Podiatry

## 2021-03-28 DIAGNOSIS — M79675 Pain in left toe(s): Secondary | ICD-10-CM

## 2021-03-28 DIAGNOSIS — M79674 Pain in right toe(s): Secondary | ICD-10-CM

## 2021-03-28 DIAGNOSIS — B351 Tinea unguium: Secondary | ICD-10-CM | POA: Diagnosis not present

## 2021-03-28 DIAGNOSIS — E1159 Type 2 diabetes mellitus with other circulatory complications: Secondary | ICD-10-CM

## 2021-03-28 NOTE — Progress Notes (Signed)
°  Subjective:  Patient ID: Samuel Morris, male    DOB: [birth year],  MRN: [id number]  Chief Complaint  Patient presents with   Nail Problem    Nail trim    76 y.o. male returns for the above complaint.  Patient presents with thickened elongated dystrophic toenails x10.  Painful to palpation.  Patient would like to have them debrided down.  He is a diabetic with last A1c of 6.0.  He is not able to do it himself.  Objective:  There were no vitals filed for this visit. Podiatric Exam: Vascular: dorsalis pedis and posterior tibial pulses are palpable bilateral. Capillary return is immediate. Temperature gradient is WNL. Skin turgor WNL  Sensorium: Decreased Semmes Weinstein monofilament test.  Decreased tactile sensation bilaterally. Nail Exam: Pt has thick disfigured discolored nails with subungual debris noted bilateral entire nail hallux through fifth toenails.  Pain on palpation to the nails. Ulcer Exam: There is no evidence of ulcer or pre-ulcerative changes or infection. Orthopedic Exam: Muscle tone and strength are WNL. No limitations in general ROM. No crepitus or effusions noted. HAV  B/L.  Hammer toes 2-5  B/L. Skin: No Porokeratosis. No infection or ulcers    Assessment & Plan:   1. Pain due to onychomycosis of toenails of both feet   2. Type 2 diabetes mellitus with cardiac complication [facility])       Patient was evaluated and treated and all questions answered.  Onychomycosis with pain  -Nails palliatively debrided as below. -Educated on self-care  Procedure: Nail Debridement Rationale: pain  Type of Debridement: manual, sharp debridement. Instrumentation: Nail nipper, rotary burr. Number of Nails: 10  Procedures and Treatment: Consent by patient was obtained for treatment procedures. The patient understood the discussion of treatment and procedures well. All questions were answered thoroughly reviewed. Debridement of mycotic and hypertrophic toenails, 1 through 5  bilateral and clearing of subungual debris. No ulceration, no infection noted.  Return Visit-Office Procedure: Patient instructed to return to the office for a follow up visit 3 months for continued evaluation and treatment.  Boneta Lucks, DPM    No follow-ups on file.

## 2021-03-29 NOTE — Patient Instructions (Addendum)
Samuel Morris,  Thank you for talking with me today. I have included our care plan/goals in the following pages.   Please review and call me at [phone] with any questions.  Thanks! Ellin Mayhew, PharmD Clinical Pharmacist  [phone]  Care Plan : ccm pharmacy care plan  Updates made by Madelin Rear, [facility] since 03/29/2021 12:00 AM     Problem: T2DM, CHF, CAD s/p MI; OSA, HLD   Priority: High  Onset Date: 03/29/2021     Long-Range Goal: disease management   Start Date: 03/29/2021  This Visit's Progress: On track  Priority: High  Note:   Current Barriers:  Unable to independently afford treatment regimen  Pharmacist Clinical Goal(s):  Patient will verbalize ability to afford treatment regimen through collaboration with PharmD and provider.   Interventions: 1:1 collaboration with Valerie Roys, DO regarding development and update of comprehensive plan of care as evidenced by provider attestation and co-signature Inter-disciplinary care team collaboration (see longitudinal plan of care) Comprehensive medication review performed; medication list updated in electronic medical record  Hypertension (BP goal <130/80) -Controlled -Current treatment: Imdur - Appropriate, Effective, Safe, Accessible Metoprolol succinate  - Appropriate, Effective, Safe, Accessible Spironolactone  - Appropriate, Effective, Safe, Accessible Furosemide  - Appropriate, Effective, Safe, Accessible Entresto - Appropriate, Effective, Safe, Accessible -Denies hypotensive/hypertensive symptoms -Educated on BP goals and benefits of medications for prevention of heart attack, stroke and kidney damage; Exercise goal of 150 minutes per week; Importance of home blood pressure monitoring; -Counseled to monitor BP at home 1-2x/wk, document, and provide log at future appointments -Counseled on diet and exercise extensively Recommended to continue current medication  Hyperlipidemia: (LDL goal <  70) -Controlled -Current treatment: Crestor 20 mg once daily  - Appropriate, Effective, Safe, Accessible Zetia 10 mg once daily  - Appropriate, Effective, Safe, Accessible -Side effect review - tolerating well, no problems -Educated on Cholesterol goals;  Benefits of statin for ASCVD risk reduction; -Recommended to continue current medication  Diabetes (A1c goal <7%) -Controlled -Current medications: Farxiga 5 mg - not taking yet.  -Medications previously tried: m -Current home glucose readings fasting glucose: 100-120s post prandial glucose: n/a -Denies hypoglycemic/hyperglycemic symptoms -Reviewed patient assistance - should qualify for farxiga pap - dapagliflozin propanediol (FARXIGA) 5 MG TABS tablet - HC to send patient an application - prefers to pick up in office - send via fax and request for office to place in patient folder for pick up  -Recommended to continue current medication  Patient Goals/Self-Care Activities Patient will:  - take medications as prescribed as evidenced by patient report and record review  Medication Assistance: Application for farxiga  medication assistance program. in process.  Anticipated assistance start date 04/2021.  See plan of care for additional detail.    The patient verbalized understanding of instructions provided today and agreed to receive a MyChart copy of patient instruction and/or educational materials. Telephone follow up appointment with pharmacy team member scheduled for: See next appointment with "Care Management Staff" under "What's Next" below.

## 2021-04-03 ENCOUNTER — Ambulatory Visit (INDEPENDENT_AMBULATORY_CARE_PROVIDER_SITE_OTHER): Payer: Medicare Other | Admitting: *Deleted

## 2021-04-03 DIAGNOSIS — Z0000 Encounter for general adult medical examination without abnormal findings: Secondary | ICD-10-CM | POA: Diagnosis not present

## 2021-04-03 NOTE — Patient Instructions (Signed)
[name] , Thank you for taking time to come for your Medicare Wellness Visit. I appreciate your ongoing commitment to your health goals. Please review the following plan we discussed and let me know if I can assist you in the future.   Screening recommendations/referrals: Colonoscopy: no longer required Recommended yearly ophthalmology/optometry visit for glaucoma screening and checkup Recommended yearly dental visit for hygiene and checkup  Vaccinations: Influenza vaccine: up to date Pneumococcal vaccine: up to date Tdap vaccine: up to date Shingles vaccine: Education provided    Advanced directives: copy requested  Conditions/risks identified:     Preventive Care 77 Years and Older, Male Preventive care refers to lifestyle choices and visits with your health care provider that can promote health and wellness. What does preventive care include? A yearly physical exam. This is also called an annual well check. Dental exams once or twice a year. Routine eye exams. Ask your health care provider how often you should have your eyes checked. Personal lifestyle choices, including: Daily care of your teeth and gums. Regular physical activity. Eating a healthy diet. Avoiding tobacco and drug use. Limiting alcohol use. Practicing safe sex. Taking low doses of aspirin every day. Taking vitamin and mineral supplements as recommended by your health care provider. What happens during an annual well check? The services and screenings done by your health care provider during your annual well check will depend on your age, overall health, lifestyle risk factors, and family history of disease. Counseling  Your health care provider may ask you questions about your: Alcohol use. Tobacco use. Drug use. Emotional well-being. Home and relationship well-being. Sexual activity. Eating habits. History of falls. Memory and ability to understand (cognition). Work and work Statistician. Screening   You may have the following tests or measurements: Height, weight, and BMI. Blood pressure. Lipid and cholesterol levels. These may be checked every 5 years, or more frequently if you are over 77 years old. Skin check. Lung cancer screening. You may have this screening every year starting at age 77 if you have a 30-pack-year history of smoking and currently smoke or have quit within the past 15 years. Fecal occult blood test (FOBT) of the stool. You may have this test every year starting at age 77. Flexible sigmoidoscopy or colonoscopy. You may have a sigmoidoscopy every 5 years or a colonoscopy every 10 years starting at age 77. Prostate cancer screening. Recommendations will vary depending on your family history and other risks. Hepatitis C blood test. Hepatitis B blood test. Sexually transmitted disease (STD) testing. Diabetes screening. This is done by checking your blood sugar (glucose) after you have not eaten for a while (fasting). You may have this done every 1-3 years. Abdominal aortic aneurysm (AAA) screening. You may need this if you are a current or former smoker. Osteoporosis. You may be screened starting at age 77 if you are at high risk. Talk with your health care provider about your test results, treatment options, and if necessary, the need for more tests. Vaccines  Your health care provider may recommend certain vaccines, such as: Influenza vaccine. This is recommended every year. Tetanus, diphtheria, and acellular pertussis (Tdap, Td) vaccine. You may need a Td booster every 10 years. Zoster vaccine. You may need this after age 18. Pneumococcal 13-valent conjugate (PCV13) vaccine. One dose is recommended after age 77. Pneumococcal polysaccharide (PPSV23) vaccine. One dose is recommended after age 77. Talk to your health care provider about which screenings and vaccines you need and how often  you need them. This information is not intended to replace advice given to you by  your health care provider. Make sure you discuss any questions you have with your health care provider. Document Released: 03/25/2015 Document Revised: 11/16/2015 Document Reviewed: 12/28/2014 Elsevier Interactive Patient Education  2017 [place] Prevention in the Home Falls can cause injuries. They can happen to people of all ages. There are many things you can do to make your home safe and to help prevent falls. What can I do on the outside of my home? Regularly fix the edges of walkways and driveways and fix any cracks. Remove anything that might make you trip as you walk through a door, such as a raised step or threshold. Trim any bushes or trees on the path to your home. Use bright outdoor lighting. Clear any walking paths of anything that might make someone trip, such as rocks or tools. Regularly check to see if handrails are loose or broken. Make sure that both sides of any steps have handrails. Any raised decks and porches should have guardrails on the edges. Have any leaves, snow, or ice cleared regularly. Use sand or salt on walking paths during winter. Clean up any spills in your garage right away. This includes oil or grease spills. What can I do in the bathroom? Use night lights. Install grab bars by the toilet and in the tub and shower. Do not use towel bars as grab bars. Use non-skid mats or decals in the tub or shower. If you need to sit down in the shower, use a plastic, non-slip stool. Keep the floor dry. Clean up any water that spills on the floor as soon as it happens. Remove soap buildup in the tub or shower regularly. Attach bath mats securely with double-sided non-slip rug tape. Do not have throw rugs and other things on the floor that can make you trip. What can I do in the bedroom? Use night lights. Make sure that you have a light by your bed that is easy to reach. Do not use any sheets or blankets that are too big for your bed. They should not hang  down onto the floor. Have a firm chair that has side arms. You can use this for support while you get dressed. Do not have throw rugs and other things on the floor that can make you trip. What can I do in the kitchen? Clean up any spills right away. Avoid walking on wet floors. Keep items that you use a lot in easy-to-reach places. If you need to reach something above you, use a strong step stool that has a grab bar. Keep electrical cords out of the way. Do not use floor polish or wax that makes floors slippery. If you must use wax, use non-skid floor wax. Do not have throw rugs and other things on the floor that can make you trip. What can I do with my stairs? Do not leave any items on the stairs. Make sure that there are handrails on both sides of the stairs and use them. Fix handrails that are broken or loose. Make sure that handrails are as long as the stairways. Check any carpeting to make sure that it is firmly attached to the stairs. Fix any carpet that is loose or worn. Avoid having throw rugs at the top or bottom of the stairs. If you do have throw rugs, attach them to the floor with carpet tape. Make sure that you have a light  switch at the top of the stairs and the bottom of the stairs. If you do not have them, ask someone to add them for you. What else can I do to help prevent falls? Wear shoes that: Do not have high heels. Have rubber bottoms. Are comfortable and fit you well. Are closed at the toe. Do not wear sandals. If you use a stepladder: Make sure that it is fully opened. Do not climb a closed stepladder. Make sure that both sides of the stepladder are locked into place. Ask someone to hold it for you, if possible. Clearly mark and make sure that you can see: Any grab bars or handrails. First and last steps. Where the edge of each step is. Use tools that help you move around (mobility aids) if they are needed. These  include: Canes. Walkers. Scooters. Crutches. Turn on the lights when you go into a dark area. Replace any light bulbs as soon as they burn out. Set up your furniture so you have a clear path. Avoid moving your furniture around. If any of your floors are uneven, fix them. If there are any pets around you, be aware of where they are. Review your medicines with your doctor. Some medicines can make you feel dizzy. This can increase your chance of falling. Ask your doctor what other things that you can do to help prevent falls. This information is not intended to replace advice given to you by your health care provider. Make sure you discuss any questions you have with your health care provider. Document Released: 12/23/2008 Document Revised: 08/04/2015 Document Reviewed: 04/02/2014 Elsevier Interactive Patient Education  2017 [employer].

## 2021-04-03 NOTE — Progress Notes (Signed)
Subjective:   Samuel Morris is a 76 y.o. male who presents for Medicare Annual/Subsequent preventive examination.  I connected with  [name] on 04/03/21 by a telephone enabled telemedicine application and verified that I am speaking with the correct person using two identifiers.   I discussed the limitations of evaluation and management by telemedicine. The patient expressed understanding and agreed to proceed.  Patient location: home  Provider location: Tele-Health Visit  not in office  Cardiac Risk Factors include: advanced age (>57men, >25 women);diabetes mellitus;male gender;hypertension     Objective:    Today's Vitals   There is no height or weight on file to calculate BMI.  Advanced Directives 04/03/2021 01/10/2021 04/01/2020 04/01/2019  Does Patient Have a Medical Advance Directive? Yes No;Yes Yes Yes  Type of Paramedic of [place];Living will [place];Living will Living will;Healthcare Power of Attorney  Does patient want to make changes to medical advance directive? No - Patient declined - - -  Copy of [place] in Chart? No - copy requested - No - copy requested No - copy requested    Current Medications (verified) Outpatient Encounter Medications as of 04/03/2021  Medication Sig   albuterol (VENTOLIN HFA) 108 (90 Base) MCG/ACT inhaler Inhale 1 puff into the lungs every 4 (four) hours as needed for wheezing or shortness of breath.   apixaban (ELIQUIS) 5 MG TABS tablet TAKE 1 TABLET BY MOUTH TWICE A DAY   Ascorbic Acid (VITAMIN C PO) Take 100 mg by mouth daily.   aspirin EC 81 MG tablet Take 81 mg by mouth daily.   Calcium-Magnesium 100-50 MG TABS Take 1 tablet by mouth daily.   Cholecalciferol (VITAMIN D-1000 MAX ST) 25 MCG (1000 UT) tablet Take 1,000 Units by mouth daily.   dapagliflozin propanediol (FARXIGA) 5 MG TABS tablet Take 1 tablet (5 mg total) by mouth daily.    diclofenac Sodium (VOLTAREN) 1 % GEL SMARTSIG:4 Gram(s) Topical Twice Daily   ENTRESTO 49-51 MG Take 1 tablet by mouth 2 (two) times daily.   ezetimibe (ZETIA) 10 MG tablet TAKE 1 TABLET(10 MG) BY MOUTH DAILY   furosemide (LASIX) 40 MG tablet Take 1 tablet (40 mg total) by mouth daily.   hydroxychloroquine (PLAQUENIL) 200 MG tablet Take 200 mg by mouth 2 (two) times daily.   isosorbide mononitrate (IMDUR) 30 MG 24 hr tablet Take 0.5 tablets (15 mg total) by mouth daily.   metoprolol succinate (TOPROL-XL) 100 MG 24 hr tablet Take 100 mg by mouth daily. Take with or immediately following a meal.   nitroGLYCERIN (NITROSTAT) 0.4 MG SL tablet Place 1 tablet (0.4 mg total) under the tongue every 5 (five) minutes as needed for chest pain.   rosuvastatin (CRESTOR) 20 MG tablet Take 1 tablet (20 mg total) by mouth at bedtime.   spironolactone (ALDACTONE) 25 MG tablet Take 1 tablet (25 mg total) by mouth daily.   Turmeric (QC TUMERIC COMPLEX PO) Take by mouth daily. Unsure of dose   vitamin B-12 (CYANOCOBALAMIN) 1000 MCG tablet Take by mouth daily.   No facility-administered encounter medications on file as of 04/03/2021.    Allergies (verified) Patient has no known allergies.   History: Past Medical History:  Diagnosis Date   Arrhythmia    paroxysmal atrial fibrillation   Cancer (HCC)    Scalp   Cataract    CHF (congestive heart failure) (HCC)    Diabetes mellitus without complication ([place])  Heart attack ([place])    Heart disease    Hyperlipidemia    Hypertension    Sleep apnea    Stenosis of aorta    Ventricular tachycardia    Past Surgical History:  Procedure Laterality Date   [place]     LEFT HEART CATH AND CORS/GRAFTS ANGIOGRAPHY N/A 01/10/2021   Procedure: LEFT HEART CATH AND CORS/GRAFTS ANGIOGRAPHY;  Surgeon: Nelva Bush, MD;  Location: [facility];  Service: Cardiovascular;  Laterality: N/A;    Family History  Problem Relation Age of Onset   Heart disease Mother    Heart attack Father    Arthritis Sister    Hypertension Sister    Heart murmur Sister    Hypertension Brother    Prostate cancer Brother    Heart disease Paternal Grandmother    Social History   Socioeconomic History   Marital status: Married    Spouse name: Not on file   Number of children: Not on file   Years of education: Not on file   Highest education level: Not on file  Occupational History   Occupation: retired  Tobacco Use   Smoking status: Never   Smokeless tobacco: Never  Vaping Use   Vaping Use: Never used  Substance and Sexual Activity   Alcohol use: Yes    Comment: On occasion   Drug use: Never   Sexual activity: Yes    Birth control/protection: None  Other Topics Concern   Not on file  Social History Narrative   Not on file   Social Determinants of Health   Financial Resource Strain: Low Risk    Difficulty of Paying Living Expenses: Not hard at all  Food Insecurity: No Food Insecurity   Worried About Charity fundraiser in the Last Year: Never true   [place] in the Last Year: Never true  Transportation Needs: No Transportation Needs   Lack of Transportation (Medical): No   Lack of Transportation (Non-Medical): No  Physical Activity: Sufficiently Active   Days of Exercise per Week: 4 days   Minutes of Exercise per Session: 40 min  Stress: No Stress Concern Present   Feeling of Stress : Not at all  Social Connections: Moderately Integrated   Frequency of Communication with Friends and Family: More than three times a week   Frequency of Social Gatherings with Friends and Family: Twice a week   Attends Religious Services: More than 4 times per year   Active Member of [employer] or Organizations: No   Attends Music therapist: Never   Marital Status: Married    Tobacco Counseling Counseling given: Not Answered   Clinical Intake:  Pre-visit preparation  completed: Yes  Pain : No/denies pain     Nutritional Risks: None Diabetes: Yes CBG done?: No Did pt. bring in CBG monitor from home?: No  How often do you need to have someone help you when you read instructions, pamphlets, or other written materials from your doctor or pharmacy?: 1 - Never  Diabetic?  Yes  Nutrition Risk Assessment:  Has the patient had any N/V/D within the last 2 months?  No  Does the patient have any non-healing wounds?  No  Has the patient had any unintentional weight loss or weight gain?  No   Diabetes:  Is the patient diabetic?  Yes  If diabetic, was a CBG obtained today?  No  Did  the patient bring in their glucometer from home?  No  How often do you monitor your CBG's? 1 x a day.   Financial Strains and Diabetes Management:  Are you having any financial strains with the device, your supplies or your medication? No .  Does the patient want to be seen by Chronic Care Management for management of their diabetes?  No  Would the patient like to be referred to a Nutritionist or for Diabetic Management?  No   Diabetic Exams:  Diabetic Eye Exam: Completed . Overdue for diabetic eye exam. Pt has been advised about the importance in completing this exam. Diabetic Foot Exam: Completed . Pt has been advised about the importance in completing this exam.  Interpreter Needed?: No  Information entered by :: Leroy Kennedy LPN   Activities of Daily Living In your present state of health, do you have any difficulty performing the following activities: 04/03/2021 01/24/2021  Hearing? N Y  Vision? N N  Difficulty concentrating or making decisions? N N  Walking or climbing stairs? N N  Comment - right knee  Dressing or bathing? N N  Doing errands, shopping? N N  Preparing Food and eating ? N -  Using the Toilet? N -  In the past six months, have you accidently leaked urine? N -  Do you have problems with loss of bowel control? N -  Managing your Medications? N  -  Managing your Finances? N -  Housekeeping or managing your Housekeeping? N -  Some recent data might be hidden    Patient Care Team: Valerie Roys, DO as PCP - General (Family Medicine) Vickie Epley, MD as PCP - Electrophysiology (Cardiology) End, Harrell Gave, MD as PCP - Cardiology (Cardiology) Madelin Rear, [facility] as Pharmacist (Pharmacist)  Indicate any recent Medical Services you may have received from other than [facility] providers in the past year (date may be approximate).     Assessment:   This is a routine wellness examination for Accident.  Hearing/Vision screen Hearing Screening - Comments:: No trouble hearing Vision Screening - Comments:: Up to date Dr. Herbert Deaner  Dietary issues and exercise activities discussed: Current Exercise Habits: Home exercise routine, Type of exercise: walking, Time (Minutes): 40, Frequency (Times/Week): 5, Weekly Exercise (Minutes/Week): 200, Intensity: Mild, Exercise limited by: None identified   Goals Addressed             This Visit's Progress    Patient Stated       Stay healthy       Depression Screen PHQ 2/9 Scores 04/03/2021 01/24/2021 08/12/2020 04/01/2020 04/01/2019 11/05/2018  PHQ - 2 Score 0 0 0 0 0 0    Fall Risk Fall Risk  04/03/2021 01/24/2021 08/12/2020 04/01/2020 04/01/2019  Falls in the past year? 0 0 0 0 0  Number falls in past yr: 0 0 0 - 0  Injury with Fall? 0 0 0 - 0  Risk for fall due to : - - No Fall Risks Medication side effect -  Follow up - Falls evaluation completed;Falls prevention discussed Falls evaluation completed Falls evaluation completed;Education provided;Falls prevention discussed -    FALL RISK PREVENTION PERTAINING TO THE HOME:  Any stairs in or around the home? No  If so, are there any without handrails? No  Home free of loose throw rugs in walkways, pet beds, electrical cords, etc? Yes  Adequate lighting in your home to reduce risk of falls? Yes   ASSISTIVE DEVICES UTILIZED TO PREVENT  FALLS:  Life alert? No  Use of a cane, walker or w/c? No  Grab bars in the bathroom? Yes  Shower chair or bench in shower? Yes  Elevated toilet seat or a handicapped toilet? Yes   TIMED UP AND GO:  Was the test performed? No .    Cognitive Function:  Normal cognitive status assessed by direct observation by this Nurse Health Advisor. No abnormalities found.       6CIT Screen 04/01/2020  What Year? 0 points  What month? 0 points  What time? 0 points  Count back from 20 0 points  Months in reverse 0 points  Repeat phrase 2 points  Total Score 2    Immunizations Immunization History  Administered Date(s) Administered   Fluad Quad(high Dose 65+) 11/06/2018, 01/04/2021   Influenza, High Dose Seasonal PF 12/11/2016, 03/14/2018   PFIZER(Purple Top)SARS-COV-2 Vaccination 05/04/2019, 05/29/2019   Pneumococcal Conjugate-13 12/14/2013   Pneumococcal Polysaccharide-23 12/01/2014   Td 10/11/2020    TDAP status: Up to date  Flu Vaccine status: Up to date  Pneumococcal vaccine status: Up to date  Covid-19 vaccine status: Information provided on how to obtain vaccines.   Qualifies for Shingles Vaccine? Yes   Zostavax completed Yes   Shingrix Completed?: No.    Education has been provided regarding the importance of this vaccine. Patient has been advised to call insurance company to determine out of pocket expense if they have not yet received this vaccine. Advised may also receive vaccine at local pharmacy or Health Dept. Verbalized acceptance and understanding.  Screening Tests Health Maintenance  Topic Date Due   Zoster Vaccines- Shingrix (1 of 2) Never done   COVID-19 Vaccine (3 - Pfizer risk series) 06/26/2019   OPHTHALMOLOGY EXAM  05/19/2021   HEMOGLOBIN A1C  07/12/2021   FOOT EXAM  10/11/2021   TETANUS/TDAP  10/12/2030   Pneumonia Vaccine 54+ Years old  Completed   INFLUENZA VACCINE  Completed   Hepatitis C Screening  Completed   HPV VACCINES  Aged Out    Health  Maintenance  Health Maintenance Due  Topic Date Due   Zoster Vaccines- Shingrix (1 of 2) Never done   COVID-19 Vaccine (3 - Pfizer risk series) 06/26/2019    Colorectal cancer screening: No longer required.   Lung Cancer Screening: (Low Dose CT Chest recommended if Age 76-80 years, 30 pack-year currently smoking OR have quit w/in 15years.) does not qualify.   Lung Cancer Screening Referral:   Additional Screening:  Hepatitis C Screening: does not qualify; Completed 2022  Vision Screening: Recommended annual ophthalmology exams for early detection of glaucoma and other disorders of the eye. Is the patient up to date with their annual eye exam?  Yes  Who is the provider or what is the name of the office in which the patient attends annual eye exams? Dr. Herbert Deaner If pt is not established with a provider, would they like to be referred to a provider to establish care? No .   Dental Screening: Recommended annual dental exams for proper oral hygiene  Community Resource Referral / Chronic Care Management: CRR required this visit?  No   CCM required this visit?  No      Plan:     I have personally reviewed and noted the following in the patients chart:   Medical and social history Use of alcohol, tobacco or illicit drugs  Current medications and supplements including opioid prescriptions. Patient is not currently taking opioid prescriptions. Functional ability and status Nutritional status Physical  activity Advanced directives List of other physicians Hospitalizations, surgeries, and ER visits in previous 12 months Vitals Screenings to include cognitive, depression, and falls Referrals and appointments  In addition, I have reviewed and discussed with patient certain preventive protocols, quality metrics, and best practice recommendations. A written personalized care plan for preventive services as well as general preventive health recommendations were provided to patient.      Leroy Kennedy, LPN   03/22/5518   Nurse Notes:

## 2021-04-07 ENCOUNTER — Telehealth: Payer: Self-pay

## 2021-04-07 NOTE — Progress Notes (Signed)
° ° °  Chronic Care Management Pharmacy Assistant   Name: Samuel Morris  MRN: [id number] DOB: Feb 20, 1946   Reason for Encounter: Patient Assistance Application on Farxiga 5 mg  Medications: Outpatient Encounter Medications as of 04/07/2021  Medication Sig   albuterol (VENTOLIN HFA) 108 (90 Base) MCG/ACT inhaler Inhale 1 puff into the lungs every 4 (four) hours as needed for wheezing or shortness of breath.   apixaban (ELIQUIS) 5 MG TABS tablet TAKE 1 TABLET BY MOUTH TWICE A DAY   Ascorbic Acid (VITAMIN C PO) Take 100 mg by mouth daily.   aspirin EC 81 MG tablet Take 81 mg by mouth daily.   Calcium-Magnesium 100-50 MG TABS Take 1 tablet by mouth daily.   Cholecalciferol (VITAMIN D-1000 MAX ST) 25 MCG (1000 UT) tablet Take 1,000 Units by mouth daily.   dapagliflozin propanediol (FARXIGA) 5 MG TABS tablet Take 1 tablet (5 mg total) by mouth daily.   diclofenac Sodium (VOLTAREN) 1 % GEL SMARTSIG:4 Gram(s) Topical Twice Daily   ENTRESTO 49-51 MG Take 1 tablet by mouth 2 (two) times daily.   ezetimibe (ZETIA) 10 MG tablet TAKE 1 TABLET(10 MG) BY MOUTH DAILY   furosemide (LASIX) 40 MG tablet Take 1 tablet (40 mg total) by mouth daily.   hydroxychloroquine (PLAQUENIL) 200 MG tablet Take 200 mg by mouth 2 (two) times daily.   isosorbide mononitrate (IMDUR) 30 MG 24 hr tablet Take 0.5 tablets (15 mg total) by mouth daily.   metoprolol succinate (TOPROL-XL) 100 MG 24 hr tablet Take 100 mg by mouth daily. Take with or immediately following a meal.   nitroGLYCERIN (NITROSTAT) 0.4 MG SL tablet Place 1 tablet (0.4 mg total) under the tongue every 5 (five) minutes as needed for chest pain.   rosuvastatin (CRESTOR) 20 MG tablet Take 1 tablet (20 mg total) by mouth at bedtime.   spironolactone (ALDACTONE) 25 MG tablet Take 1 tablet (25 mg total) by mouth daily.   Turmeric (QC TUMERIC COMPLEX PO) Take by mouth daily. Unsure of dose   vitamin B-12 (CYANOCOBALAMIN) 1000 MCG tablet Take by mouth daily.   No  facility-administered encounter medications on file as of 04/07/2021.   I have successfully faxed patient assistance application and attachments to front desk at [facility] practice.The patient can pick up and complete the form. I have contacted the patient that the forms will be at the front desk for him to complete and will need to fill out the income information section, sign and date as well, once completed he will need to give back the form so Dr. Wynetta Emery can sign and fax to [place]. Patient understood the directions and I also mentioned he can call me for any assistance if needed.   Corrie Mckusick, [place]

## 2021-04-08 ENCOUNTER — Other Ambulatory Visit: Payer: Self-pay | Admitting: Family Medicine

## 2021-04-08 NOTE — Telephone Encounter (Signed)
Requested Prescriptions  Pending Prescriptions Disp Refills   rosuvastatin (CRESTOR) 20 MG tablet [Pharmacy Med Name: ROSUVASTATIN CALCIUM 20 MG TAB] 90 tablet 1    Sig: TAKE 1 TABLET BY MOUTH AT BEDTIME     Cardiovascular:  Antilipid - Statins Passed - 04/08/2021  1:23 PM      Passed - Total Cholesterol in normal range and within 360 days    Cholesterol, Total  Date Value Ref Range Status  10/11/2020 130 100 - 199 mg/dL Final         Passed - LDL in normal range and within 360 days    LDL Chol Calc (NIH)  Date Value Ref Range Status  10/11/2020 58 0 - 99 mg/dL Final         Passed - HDL in normal range and within 360 days    HDL  Date Value Ref Range Status  10/11/2020 54 >39 mg/dL Final         Passed - Triglycerides in normal range and within 360 days    Triglycerides  Date Value Ref Range Status  10/11/2020 98 0 - 149 mg/dL Final         Passed - Patient is not pregnant      Passed - Valid encounter within last 12 months    Recent Outpatient Visits          2 months ago NSTEMI (non-ST elevated myocardial infarction) ([place])   [place], Megan P, DO   4 months ago COVID-9   [place], Santiago Glad, NP   5 months ago Primary hypertension   [place], Megan P, DO   7 months ago Interstitial lung disease ([place])   [place], Megan P, DO   9 months ago [place], Scheryl Darter, NP      Future Appointments            In 5 days Valerie Roys, DO [employer], [place]   In 1 week Wynetta Emery, Barb Merino, DO [employer], [place]   In 2 weeks Dunn, Ryan M, PA-C [place], [facility]

## 2021-04-11 DIAGNOSIS — E1159 Type 2 diabetes mellitus with other circulatory complications: Secondary | ICD-10-CM | POA: Diagnosis not present

## 2021-04-11 DIAGNOSIS — I10 Essential (primary) hypertension: Secondary | ICD-10-CM

## 2021-04-13 ENCOUNTER — Other Ambulatory Visit: Payer: Self-pay

## 2021-04-13 ENCOUNTER — Ambulatory Visit (INDEPENDENT_AMBULATORY_CARE_PROVIDER_SITE_OTHER): Payer: Medicare Other | Admitting: Family Medicine

## 2021-04-13 ENCOUNTER — Encounter: Payer: Self-pay | Admitting: Family Medicine

## 2021-04-13 ENCOUNTER — Telehealth: Payer: Self-pay | Admitting: Family Medicine

## 2021-04-13 VITALS — BP 112/72 | HR 72 | Temp 98.0°F | Ht 64.25 in | Wt 184.0 lb

## 2021-04-13 DIAGNOSIS — I209 Angina pectoris, unspecified: Secondary | ICD-10-CM

## 2021-04-13 DIAGNOSIS — I10 Essential (primary) hypertension: Secondary | ICD-10-CM | POA: Diagnosis not present

## 2021-04-13 DIAGNOSIS — E1169 Type 2 diabetes mellitus with other specified complication: Secondary | ICD-10-CM | POA: Diagnosis not present

## 2021-04-13 DIAGNOSIS — D692 Other nonthrombocytopenic purpura: Secondary | ICD-10-CM | POA: Diagnosis not present

## 2021-04-13 DIAGNOSIS — E785 Hyperlipidemia, unspecified: Secondary | ICD-10-CM

## 2021-04-13 DIAGNOSIS — I420 Dilated cardiomyopathy: Secondary | ICD-10-CM

## 2021-04-13 DIAGNOSIS — I350 Nonrheumatic aortic (valve) stenosis: Secondary | ICD-10-CM

## 2021-04-13 DIAGNOSIS — I255 Ischemic cardiomyopathy: Secondary | ICD-10-CM

## 2021-04-13 DIAGNOSIS — E1159 Type 2 diabetes mellitus with other circulatory complications: Secondary | ICD-10-CM | POA: Diagnosis not present

## 2021-04-13 DIAGNOSIS — J849 Interstitial pulmonary disease, unspecified: Secondary | ICD-10-CM

## 2021-04-13 DIAGNOSIS — M069 Rheumatoid arthritis, unspecified: Secondary | ICD-10-CM

## 2021-04-13 DIAGNOSIS — I25118 Atherosclerotic heart disease of native coronary artery with other forms of angina pectoris: Secondary | ICD-10-CM

## 2021-04-13 LAB — BAYER DCA HB A1C WAIVED: HB A1C (BAYER DCA - WAIVED): 6.4 % — ABNORMAL HIGH (ref 4.8–5.6)

## 2021-04-13 MED ORDER — ISOSORBIDE MONONITRATE ER 30 MG PO TB24: 15.0000 mg | ORAL_TABLET | Freq: Every day | ORAL | 0 refills | Status: DC

## 2021-04-13 MED ORDER — ALBUTEROL SULFATE HFA 108 (90 BASE) MCG/ACT IN AERS: 1.0000 | INHALATION_SPRAY | RESPIRATORY_TRACT | 3 refills | Status: DC | PRN

## 2021-04-13 MED ORDER — SPIRONOLACTONE 25 MG PO TABS: 25.0000 mg | ORAL_TABLET | Freq: Every day | ORAL | 1 refills | Status: DC

## 2021-04-13 MED ORDER — DAPAGLIFLOZIN PROPANEDIOL 5 MG PO TABS: 5.0000 mg | ORAL_TABLET | Freq: Every day | ORAL | 1 refills | Status: DC

## 2021-04-13 NOTE — Assessment & Plan Note (Signed)
Under good control on current regimen. Continue current regimen. Continue to monitor. Call with any concerns. Refills given. Labs drawn today.   

## 2021-04-13 NOTE — Assessment & Plan Note (Signed)
Stable. Continue to follow with cardiology. Call with any concerns.  

## 2021-04-13 NOTE — Assessment & Plan Note (Signed)
Doing well with a1c of 6.4. Continue current regimen. Continue to monitor. Call with any concerns.  

## 2021-04-13 NOTE — Assessment & Plan Note (Signed)
Stable. Continue to follow with pulmonology. Call with any concerns.  

## 2021-04-13 NOTE — Telephone Encounter (Signed)
Patient dropped off AZ&ME paperwork for provider to complete.  When completed, needs to be faxed to [phone].  Patient can contacted at [phone] with any questions/concerns.  Placed in provider's folder.

## 2021-04-13 NOTE — Progress Notes (Signed)
BP 112/72    Pulse 72    Temp 98 F (36.7 C)    Ht 5' 4.25" (1.632 m)    Wt 184 lb (83.5 kg)    SpO2 97%    BMI 31.34 kg/m    Subjective:    Patient ID: Samuel Morris, male    DOB: 06-Apr-1945, 76 y.o.   MRN: [id number]  HPI: Samuel Morris is a 76 y.o. male  Chief Complaint  Patient presents with   Diabetes   Coronary Artery Disease   Hyperlipidemia   HYPERTENSION / HYPERLIPIDEMIA Satisfied with current treatment? yes Duration of hypertension: chronic BP monitoring frequency: a few times a week BP medication side effects: no Past BP meds: imdur, lasix, spironalactone, metoprolol entresto Duration of hyperlipidemia: chronic Cholesterol medication side effects: no Cholesterol supplements: none Past cholesterol medications: crestor, zetia Medication compliance: excellent compliance Aspirin: no Recent stressors: no Recurrent headaches: no Visual changes: no Palpitations: no Dyspnea: no Chest pain: no Lower extremity edema: no Dizzy/lightheaded: no  DIABETES Hypoglycemic episodes:no Polydipsia/polyuria: no Visual disturbance: no Chest pain: no Paresthesias: no Glucose Monitoring: yes Taking Insulin?: no Blood Pressure Monitoring: not checking Retinal Examination: Up to Date Foot Exam: Up to Date Diabetic Education: Completed Pneumovax: Up to Date Influenza: Up to Date Aspirin: no  Relevant past medical, surgical, family and social history reviewed and updated as indicated. Interim medical history since our last visit reviewed. Allergies and medications reviewed and updated.  Review of Systems  Constitutional: Negative.   Respiratory: Negative.    Cardiovascular: Negative.   Gastrointestinal: Negative.   Musculoskeletal:  Positive for arthralgias. Negative for back pain, gait problem, joint swelling, myalgias, neck pain and neck stiffness.  Psychiatric/Behavioral: Negative.     Per HPI unless specifically indicated above     Objective:    BP 112/72     Pulse 72    Temp 98 F (36.7 C)    Ht 5' 4.25" (1.632 m)    Wt 184 lb (83.5 kg)    SpO2 97%    BMI 31.34 kg/m   Wt Readings from Last 3 Encounters:  04/13/21 184 lb (83.5 kg)  02/21/21 185 lb (83.9 kg)  02/01/21 192 lb 9.6 oz (87.4 kg)    Physical Exam Vitals and nursing note reviewed.  Constitutional:      General: He is not in acute distress.    Appearance: Normal appearance. He is not ill-appearing, toxic-appearing or diaphoretic.  HENT:     Head: Normocephalic and atraumatic.     Right Ear: External ear normal.     Left Ear: External ear normal.     Nose: Nose normal.     Mouth/Throat:     Mouth: Mucous membranes are moist.     Pharynx: Oropharynx is clear.  Eyes:     General: No scleral icterus.       Right eye: No discharge.        Left eye: No discharge.     Extraocular Movements: Extraocular movements intact.     Conjunctiva/sclera: Conjunctivae normal.     Pupils: Pupils are equal, round, and reactive to light.  Cardiovascular:     Rate and Rhythm: Normal rate and regular rhythm.     Pulses: Normal pulses.     Heart sounds: Murmur heard.    No friction rub. No gallop.  Pulmonary:     Effort: Pulmonary effort is normal. No respiratory distress.     Breath sounds: Normal breath sounds. No stridor. No wheezing,  rhonchi or rales.  Chest:     Chest wall: No tenderness.  Musculoskeletal:        General: Normal range of motion.     Cervical back: Normal range of motion and neck supple.  Skin:    General: Skin is warm and dry.     Capillary Refill: Capillary refill takes less than 2 seconds.     Coloration: Skin is not jaundiced or pale.     Findings: No bruising, erythema, lesion or rash.  Neurological:     General: No focal deficit present.     Mental Status: He is alert and oriented to person, place, and time. Mental status is at baseline.  Psychiatric:        Mood and Affect: Mood normal.        Behavior: Behavior normal.        Thought Content: Thought  content normal.        Judgment: Judgment normal.    Results for orders placed or performed in visit on 04/13/21  Bayer DCA Hb A1c Waived  Result Value Ref Range   HB A1C (BAYER DCA - WAIVED) 6.4 (H) 4.8 - 5.6 %      Assessment & Plan:   Problem List Items Addressed This Visit       Cardiovascular and Mediastinum   CAD (coronary artery disease)    Stable. Continue to follow with cardiology. Call with any concerns.       Relevant Medications   isosorbide mononitrate (IMDUR) 30 MG 24 hr tablet   spironolactone (ALDACTONE) 25 MG tablet   Type 2 diabetes mellitus with cardiac complication ([place]) - Primary    Doing well with a1c of 6.4. Continue current regimen. Continue to monitor. Call with any concerns.       Relevant Medications   isosorbide mononitrate (IMDUR) 30 MG 24 hr tablet   spironolactone (ALDACTONE) 25 MG tablet   dapagliflozin propanediol (FARXIGA) 5 MG TABS tablet   Other Relevant Orders   Comprehensive metabolic panel   CBC with Differential/Platelet   Bayer DCA Hb A1c Waived (Completed)   Lipid Panel w/o Chol/HDL Ratio   HTN (hypertension)    Under good control on current regimen. Continue current regimen. Continue to monitor. Call with any concerns. Refills given. Labs drawn today.       Relevant Medications   isosorbide mononitrate (IMDUR) 30 MG 24 hr tablet   spironolactone (ALDACTONE) 25 MG tablet   Aortic stenosis    Stable. Continue to follow with cardiology. Call with any concerns.       Relevant Medications   isosorbide mononitrate (IMDUR) 30 MG 24 hr tablet   spironolactone (ALDACTONE) 25 MG tablet   Angina pectoris (HCC)    Stable. Continue to follow with cardiology. Call with any concerns.       Relevant Medications   isosorbide mononitrate (IMDUR) 30 MG 24 hr tablet   spironolactone (ALDACTONE) 25 MG tablet   Dilated cardiomyopathy (HCC)    Stable. Continue to follow with cardiology. Call with any concerns.       Relevant  Medications   isosorbide mononitrate (IMDUR) 30 MG 24 hr tablet   spironolactone (ALDACTONE) 25 MG tablet   Ischemic cardiomyopathy    Stable. Continue to follow with cardiology. Call with any concerns.       Relevant Medications   isosorbide mononitrate (IMDUR) 30 MG 24 hr tablet   spironolactone (ALDACTONE) 25 MG tablet   Senile purpura ([place])    Reassured  patient. Continue to monitor.       Relevant Medications   isosorbide mononitrate (IMDUR) 30 MG 24 hr tablet   spironolactone (ALDACTONE) 25 MG tablet   Other Relevant Orders   Comprehensive metabolic panel   CBC with Differential/Platelet     Respiratory   Interstitial lung disease ([place])    Stable. Continue to follow with pulmonology. Call with any concerns.         Endocrine   Hyperlipidemia associated with type 2 diabetes mellitus ([place])    Under good control on current regimen. Continue current regimen. Continue to monitor. Call with any concerns. Refills given. Labs drawn today.       Relevant Medications   isosorbide mononitrate (IMDUR) 30 MG 24 hr tablet   spironolactone (ALDACTONE) 25 MG tablet   dapagliflozin propanediol (FARXIGA) 5 MG TABS tablet   Other Relevant Orders   Comprehensive metabolic panel   CBC with Differential/Platelet   Lipid Panel w/o Chol/HDL Ratio     Musculoskeletal and Integument   Rheumatoid arthritis (HCC)    Stable. Continue to follow with rheumatology. Call with any concerns.       Relevant Medications   methotrexate (RHEUMATREX) 2.5 MG tablet     Follow up plan: Return in about 6 months (around 10/11/2021) for OK to cancel the appt 04/19/21.

## 2021-04-13 NOTE — Assessment & Plan Note (Signed)
Stable. Continue to follow with rheumatology. Call with any concerns.  

## 2021-04-13 NOTE — Assessment & Plan Note (Signed)
Reassured patient. Continue to monitor.  

## 2021-04-14 ENCOUNTER — Ambulatory Visit (INDEPENDENT_AMBULATORY_CARE_PROVIDER_SITE_OTHER): Payer: Medicare Other

## 2021-04-14 ENCOUNTER — Telehealth: Payer: Self-pay

## 2021-04-14 DIAGNOSIS — I255 Ischemic cardiomyopathy: Secondary | ICD-10-CM | POA: Diagnosis not present

## 2021-04-14 LAB — COMPREHENSIVE METABOLIC PANEL
ALT: 27 IU/L (ref 0–44)
AST: 18 IU/L (ref 0–40)
Albumin/Globulin Ratio: 1.7 (ref 1.2–2.2)
Albumin: 4.5 g/dL (ref 3.7–4.7)
Alkaline Phosphatase: 103 IU/L (ref 44–121)
BUN/Creatinine Ratio: 20 (ref 10–24)
BUN: 18 mg/dL (ref 8–27)
Bilirubin Total: 0.5 mg/dL (ref 0.0–1.2)
CO2: 25 mmol/L (ref 20–29)
Calcium: 9.9 mg/dL (ref 8.6–10.2)
Chloride: 99 mmol/L (ref 96–106)
Creatinine, Ser: 0.9 mg/dL (ref 0.76–1.27)
Globulin, Total: 2.6 g/dL (ref 1.5–4.5)
Glucose: 148 mg/dL — ABNORMAL HIGH (ref 70–99)
Potassium: 4.8 mmol/L (ref 3.5–5.2)
Sodium: 137 mmol/L (ref 134–144)
Total Protein: 7.1 g/dL (ref 6.0–8.5)
eGFR: 89 mL/min/{1.73_m2} (ref >59)

## 2021-04-14 LAB — CBC WITH DIFFERENTIAL/PLATELET
Basophils Absolute: 0.1 10*3/uL (ref 0.0–0.2)
Basos: 1 %
EOS (ABSOLUTE): 0.3 10*3/uL (ref 0.0–0.4)
Eos: 3 %
Hematocrit: 45 % (ref 37.5–51.0)
Hemoglobin: 14.7 g/dL (ref 13.0–17.7)
Immature Grans (Abs): 0 10*3/uL (ref 0.0–0.1)
Immature Granulocytes: 0 %
Lymphocytes Absolute: 1.4 10*3/uL (ref 0.7–3.1)
Lymphs: 16 %
MCH: 28.6 pg (ref 26.6–33.0)
MCHC: 32.7 g/dL (ref 31.5–35.7)
MCV: 88 fL (ref 79–97)
Monocytes Absolute: 1 10*3/uL — ABNORMAL HIGH (ref 0.1–0.9)
Monocytes: 12 %
Neutrophils Absolute: 5.8 10*3/uL (ref 1.4–7.0)
Neutrophils: 68 %
Platelets: 283 10*3/uL (ref 150–450)
RBC: 5.14 x10E6/uL (ref 4.14–5.80)
RDW: 13.5 % (ref 11.6–15.4)
WBC: 8.6 10*3/uL (ref 3.4–10.8)

## 2021-04-14 LAB — LIPID PANEL W/O CHOL/HDL RATIO
Cholesterol, Total: 134 mg/dL (ref 100–199)
HDL: 43 mg/dL (ref >39)
LDL Chol Calc (NIH): 74 mg/dL (ref 0–99)
Triglycerides: 87 mg/dL (ref 0–149)
VLDL Cholesterol Cal: 17 mg/dL (ref 5–40)

## 2021-04-14 NOTE — Telephone Encounter (Signed)
Form has been signed and faxed. 

## 2021-04-14 NOTE — Telephone Encounter (Signed)
Patient called in stating he is having issues with his monitor. We have called tech support and tried troubleshooting, they will send him a new handheld and it should arrive 7-10 business days. Patient will send a transmission once that is received

## 2021-04-14 NOTE — Telephone Encounter (Signed)
This has already been signed

## 2021-04-17 LAB — CUP PACEART REMOTE DEVICE CHECK
Battery Remaining Longevity: 88 mo
Battery Voltage: 3 V
Brady Statistic AP VP Percent: 0.1 %
Brady Statistic AP VS Percent: 3.96 %
Brady Statistic AS VP Percent: 0.04 %
Brady Statistic AS VS Percent: 95.9 %
Brady Statistic RA Percent Paced: 4.01 %
Brady Statistic RV Percent Paced: 0.13 %
Date Time Interrogation Session: 20230206031603
HighPow Impedance: 77 Ohm
Implantable Lead Implant Date: 20121205
Implantable Lead Implant Date: 20121205
Implantable Lead Location: 753859
Implantable Lead Location: 753860
Implantable Lead Model: 5076
Implantable Pulse Generator Implant Date: 20190702
Lead Channel Impedance Value: 399 Ohm
Lead Channel Impedance Value: 437 Ohm
Lead Channel Impedance Value: 456 Ohm
Lead Channel Pacing Threshold Amplitude: 0.625 V
Lead Channel Pacing Threshold Amplitude: 0.625 V
Lead Channel Pacing Threshold Pulse Width: 0.4 ms
Lead Channel Pacing Threshold Pulse Width: 0.4 ms
Lead Channel Sensing Intrinsic Amplitude: 2.375 mV
Lead Channel Sensing Intrinsic Amplitude: 2.375 mV
Lead Channel Sensing Intrinsic Amplitude: 21.625 mV
Lead Channel Sensing Intrinsic Amplitude: 21.625 mV
Lead Channel Setting Pacing Amplitude: 1.5 V
Lead Channel Setting Pacing Amplitude: 2 V
Lead Channel Setting Pacing Pulse Width: 0.4 ms
Lead Channel Setting Sensing Sensitivity: 0.3 mV

## 2021-04-19 ENCOUNTER — Ambulatory Visit: Payer: Medicare Other | Admitting: Family Medicine

## 2021-04-19 NOTE — Progress Notes (Signed)
Remote ICD transmission.   

## 2021-04-26 ENCOUNTER — Ambulatory Visit: Payer: Medicare Other | Admitting: Physician Assistant

## 2021-04-27 ENCOUNTER — Encounter: Payer: Self-pay | Admitting: Family Medicine

## 2021-04-27 ENCOUNTER — Other Ambulatory Visit: Payer: Self-pay

## 2021-04-27 ENCOUNTER — Ambulatory Visit (INDEPENDENT_AMBULATORY_CARE_PROVIDER_SITE_OTHER): Payer: Medicare Other | Admitting: Family Medicine

## 2021-04-27 VITALS — BP 104/67 | HR 89 | Temp 98.0°F | Wt 185.8 lb

## 2021-04-27 DIAGNOSIS — R053 Chronic cough: Secondary | ICD-10-CM

## 2021-04-27 DIAGNOSIS — I255 Ischemic cardiomyopathy: Secondary | ICD-10-CM

## 2021-04-27 MED ORDER — BENZONATATE 200 MG PO CAPS: 200.0000 mg | ORAL_CAPSULE | Freq: Two times a day (BID) | ORAL | 0 refills | Status: DC | PRN

## 2021-04-27 NOTE — Progress Notes (Signed)
BP 104/67    Pulse 89    Temp 98 F (36.7 C)    Wt 185 lb 12.8 oz (84.3 kg)    SpO2 95%    BMI 31.64 kg/m    Subjective:    Patient ID: Samuel Morris, male    DOB: 07-28-1945, 76 y.o.   MRN: [id number]  HPI: Samuel Morris is a 76 y.o. male  Chief Complaint  Patient presents with   Cough    Patient states he has been coughing and wheezing for a few weeks, has been getting worse    COUGH Duration:  chronic- worse in the past couple of weeks Circumstances of initial development of cough: unknown Cough severity: moderate Cough description: non-productive and wet Aggravating factors:  nothing Alleviating factors: nothing Status:  fluctuating Treatments attempted: albuterol Wheezing: yes Shortness of breath: yes Chest pain: no Chest tightness:no Nasal congestion: no Runny nose: no Postnasal drip: no Frequent throat clearing or swallowing: yes Hemoptysis: no Fevers: no Night sweats: no Weight loss: no Heartburn: no Recent foreign travel: no Tuberculosis contacts: no  Relevant past medical, surgical, family and social history reviewed and updated as indicated. Interim medical history since our last visit reviewed. Allergies and medications reviewed and updated.  Review of Systems  Constitutional: Negative.   HENT: Negative.    Respiratory:  Positive for cough. Negative for apnea, choking, chest tightness, shortness of breath, wheezing and stridor.   Cardiovascular: Negative.   Gastrointestinal: Negative.   Neurological: Negative.   Psychiatric/Behavioral: Negative.     Per HPI unless specifically indicated above     Objective:    BP 104/67    Pulse 89    Temp 98 F (36.7 C)    Wt 185 lb 12.8 oz (84.3 kg)    SpO2 95%    BMI 31.64 kg/m   Wt Readings from Last 3 Encounters:  04/27/21 185 lb 12.8 oz (84.3 kg)  04/13/21 184 lb (83.5 kg)  02/21/21 185 lb (83.9 kg)    Physical Exam Vitals and nursing note reviewed.  Constitutional:      General: He is not in acute  distress.    Appearance: Normal appearance. He is not ill-appearing, toxic-appearing or diaphoretic.  HENT:     Head: Normocephalic and atraumatic.     Right Ear: External ear normal.     Left Ear: External ear normal.     Nose: Nose normal.     Mouth/Throat:     Mouth: Mucous membranes are moist.     Pharynx: Oropharynx is clear.  Eyes:     General: No scleral icterus.       Right eye: No discharge.        Left eye: No discharge.     Extraocular Movements: Extraocular movements intact.     Conjunctiva/sclera: Conjunctivae normal.     Pupils: Pupils are equal, round, and reactive to light.  Cardiovascular:     Rate and Rhythm: Normal rate and regular rhythm.     Pulses: Normal pulses.     Heart sounds: Normal heart sounds. No murmur heard.   No friction rub. No gallop.  Pulmonary:     Effort: Pulmonary effort is normal. No respiratory distress.     Breath sounds: No stridor. Rales (bilateral bases) present. No wheezing or rhonchi.  Chest:     Chest wall: No tenderness.  Musculoskeletal:        General: Normal range of motion.     Cervical back: Normal range of  motion and neck supple.  Skin:    General: Skin is warm and dry.     Capillary Refill: Capillary refill takes less than 2 seconds.     Coloration: Skin is not jaundiced or pale.     Findings: No bruising, erythema, lesion or rash.  Neurological:     General: No focal deficit present.     Mental Status: He is alert and oriented to person, place, and time. Mental status is at baseline.  Psychiatric:        Mood and Affect: Mood normal.        Behavior: Behavior normal.        Thought Content: Thought content normal.        Judgment: Judgment normal.    Results for orders placed or performed in visit on 04/14/21  CUP [place] CHECK  Result Value Ref Range   Date Time Interrogation Session [id number]    Pulse Generator Manufacturer MERM    Pulse Gen Model [id number] Evera MRI XT DR    Pulse Gen  Serial Number [id number]    Clinic Name [facility] Pulse Generator Type Implantable Cardiac Defibulator    Implantable Pulse Generator Implant Date [id number]    Implantable Lead Manufacturer MERM    Implantable Lead Model 5076 CapSureFix Novus MRI SureScan    Implantable Lead Serial Number [id number]    Implantable Lead Implant Date [id number]    Implantable Lead Location Detail 1 UNKNOWN    Implantable Lead Location [id number]    Implantable Lead Manufacturer [facility]    Implantable Lead Model [id number] Sprint Quattro Secure S MRI SureScan    Implantable Lead Serial Number [id number]    Implantable Lead Implant Date [id number]    Implantable Lead Location Detail 1 UNKNOWN    Implantable Lead Location [id number]    Lead Channel Setting Sensing Sensitivity 0.3 mV   Lead Channel Setting Pacing Amplitude 1.5 V   Lead Channel Setting Pacing Pulse Width 0.4 ms   Lead Channel Setting Pacing Amplitude 2 V   Lead Channel Impedance Value 456 ohm   Lead Channel Sensing Intrinsic Amplitude 2.375 mV   Lead Channel Sensing Intrinsic Amplitude 2.375 mV   Lead Channel Pacing Threshold Amplitude 0.625 V   Lead Channel Pacing Threshold Pulse Width 0.4 ms   Lead Channel Impedance Value 437 ohm   Lead Channel Impedance Value 399 ohm   Lead Channel Sensing Intrinsic Amplitude 21.625 mV   Lead Channel Sensing Intrinsic Amplitude 21.625 mV   Lead Channel Pacing Threshold Amplitude 0.625 V   Lead Channel Pacing Threshold Pulse Width 0.4 ms   HighPow Impedance 77 ohm   Battery Status OK    Battery Remaining Longevity 88 mo   Battery Voltage 3.00 V   Brady Statistic RA Percent Paced 4.01 %   Brady Statistic RV Percent Paced 0.13 %   Brady Statistic AP VP Percent 0.1 %   Brady Statistic AS VP Percent 0.04 %   Brady Statistic AP VS Percent 3.96 %   Brady Statistic AS VS Percent 95.9 %      Assessment & Plan:   Problem List Items Addressed This Visit   None Visit Diagnoses     Chronic cough    -   Primary   Rales on exam- will check CXR and treat for either bronchitis or CHF. Await results. Tessalon sent through.   Relevant Orders   DG Chest 2 View  Follow up plan: Return if symptoms worsen or fail to improve.

## 2021-04-28 ENCOUNTER — Ambulatory Visit
Admission: RE | Admit: 2021-04-28 | Discharge: 2021-04-28 | Disposition: A | Discharge status: Home / Self Care | Payer: Medicare Other | Attending: Family Medicine | Admitting: Family Medicine

## 2021-04-28 ENCOUNTER — Ambulatory Visit
Admission: RE | Admit: 2021-04-28 | Discharge: 2021-04-28 | Disposition: A | Discharge status: Home / Self Care | Payer: Medicare Other | Source: Ambulatory Visit | Attending: Family Medicine | Admitting: Family Medicine

## 2021-04-28 DIAGNOSIS — R053 Chronic cough: Secondary | ICD-10-CM | POA: Diagnosis not present

## 2021-05-07 ENCOUNTER — Other Ambulatory Visit: Payer: Self-pay | Admitting: Internal Medicine

## 2021-05-08 ENCOUNTER — Telehealth: Payer: Self-pay

## 2021-05-08 NOTE — Progress Notes (Signed)
I have called patient and patients wife. I have mentioned about patients Farixga medication being approved. I have given them the phone number to locate tracking for the medication if it has not been received with in 1-2 business days. I have also given them my phone number information in case they have trouble with anything. Patient has also been updated on Clinical pharmacist panel.  Corrie Mckusick, [place]

## 2021-05-08 NOTE — Telephone Encounter (Signed)
Please contact pt for future appointment. Pt due for 2 month f/u.   

## 2021-05-08 NOTE — Telephone Encounter (Signed)
LVM to schedule

## 2021-05-08 NOTE — Telephone Encounter (Signed)
°  Chronic Care Management  Outreach Note   Name: Rilan Eiland MRN: [id number] DOB: Aug 29, 1945  Received letter from [place]&ME stating farxiga 5 mg is approved. Letter dated 2/25 - states shipment should arrive in 1-2 business days. Please inform patient and provided number for tracking if patient wants additional information. Phone number is [phone].

## 2021-05-17 ENCOUNTER — Ambulatory Visit: Payer: Medicare Other | Admitting: Medical

## 2021-06-15 NOTE — Telephone Encounter (Signed)
Patient will call back

## 2021-06-27 ENCOUNTER — Ambulatory Visit (INDEPENDENT_AMBULATORY_CARE_PROVIDER_SITE_OTHER): Payer: Medicare Other | Admitting: Podiatry

## 2021-06-27 ENCOUNTER — Encounter: Payer: Self-pay | Admitting: Podiatry

## 2021-06-27 ENCOUNTER — Ambulatory Visit (INDEPENDENT_AMBULATORY_CARE_PROVIDER_SITE_OTHER): Payer: Medicare Other | Admitting: Family Medicine

## 2021-06-27 ENCOUNTER — Encounter: Payer: Self-pay | Admitting: Family Medicine

## 2021-06-27 VITALS — BP 124/56 | HR 76 | Temp 98.5°F | Wt 188.0 lb

## 2021-06-27 DIAGNOSIS — J069 Acute upper respiratory infection, unspecified: Secondary | ICD-10-CM | POA: Diagnosis not present

## 2021-06-27 DIAGNOSIS — M79674 Pain in right toe(s): Secondary | ICD-10-CM | POA: Diagnosis not present

## 2021-06-27 DIAGNOSIS — E1159 Type 2 diabetes mellitus with other circulatory complications: Secondary | ICD-10-CM | POA: Diagnosis not present

## 2021-06-27 DIAGNOSIS — I255 Ischemic cardiomyopathy: Secondary | ICD-10-CM | POA: Diagnosis not present

## 2021-06-27 DIAGNOSIS — B351 Tinea unguium: Secondary | ICD-10-CM | POA: Diagnosis not present

## 2021-06-27 DIAGNOSIS — M79675 Pain in left toe(s): Secondary | ICD-10-CM | POA: Diagnosis not present

## 2021-06-27 MED ORDER — PREDNISONE 50 MG PO TABS
50.0000 mg | ORAL_TABLET | Freq: Every day | ORAL | 0 refills | Status: DC
Start: 2021-06-27 — End: 2021-07-25

## 2021-06-27 NOTE — Progress Notes (Signed)
?  Subjective:  ?Patient ID: Samuel Morris, male    DOB: 03-24-1945,  MRN: [id number] ? ?Chief Complaint  ?Patient presents with  ? Nail Problem  ?  Nail trim   ? ?76 y.o. male returns for the above complaint.  Patient presents with thickened elongated dystrophic toenails x10.  Painful to palpation.  Patient would like to have them debrided down.  He is a diabetic with last A1c of 6.0.  He is not able to do it himself. ? ?Objective:  ?There were no vitals filed for this visit. ?Podiatric Exam: ?Vascular: dorsalis pedis and posterior tibial pulses are palpable bilateral. Capillary return is immediate. Temperature gradient is WNL. Skin turgor WNL  ?Sensorium: Decreased Semmes Weinstein monofilament test.  Decreased tactile sensation bilaterally. ?Nail Exam: Pt has thick disfigured discolored nails with subungual debris noted bilateral entire nail hallux through fifth toenails.  Pain on palpation to the nails. ?Ulcer Exam: There is no evidence of ulcer or pre-ulcerative changes or infection. ?Orthopedic Exam: Muscle tone and strength are WNL. No limitations in general ROM. No crepitus or effusions noted. HAV  B/L.  Hammer toes 2-5  B/L. ?Skin: No Porokeratosis. No infection or ulcers ? ? ? ?Assessment & Plan:  ? ?No diagnosis found. ? ? ? ? ?Patient was evaluated and treated and all questions answered. ? ?Onychomycosis with pain  ?-Nails palliatively debrided as below. ?-Educated on self-care ? ?Procedure: Nail Debridement ?Rationale: pain  ?Type of Debridement: manual, sharp debridement. ?Instrumentation: Nail nipper, rotary burr. ?Number of Nails: 10 ? ?Procedures and Treatment: Consent by patient was obtained for treatment procedures. The patient understood the discussion of treatment and procedures well. All questions were answered thoroughly reviewed. Debridement of mycotic and hypertrophic toenails, 1 through 5 bilateral and clearing of subungual debris. No ulceration, no infection noted.  ?Return Visit-Office  Procedure: Patient instructed to return to the office for a follow up visit 3 months for continued evaluation and treatment. ? ?Boneta Lucks, DPM ?  ? ?No follow-ups on file. ? ? ?

## 2021-06-27 NOTE — Progress Notes (Signed)
? ?BP (!) 124/56   Pulse 76   Temp 98.5 ?F (36.9 ?C)   Wt 188 lb (85.3 kg)   SpO2 97%   BMI 32.02 kg/m?   ? ?Subjective:  ? ? Patient ID: Samuel Morris, male    DOB: 12/21/1945, 76 y.o.   MRN: [id number] ? ?HPI: ?Samuel Morris is a 76 y.o. male ? ?Chief Complaint  ?Patient presents with  ? Cough  ?  Patient states he has been coughing for a few weeks, is getting worse. Patient states he has a nasal drip.   ? ?UPPER RESPIRATORY TRACT INFECTION ?Duration: about a week ?Worst symptom: cough ?Fever: no ?Cough: yes ?Shortness of breath: yes ?Wheezing: yes ?Chest pain: no ?Chest tightness: no ?Chest congestion: no ?Nasal congestion: no ?Runny nose: no ?Post nasal drip: yes ?Sneezing: no ?Sore throat: no ?Swollen glands: no ?Sinus pressure: no ?Headache: no ?Face pain: no ?Toothache: no ?Ear pain: no  ?Ear pressure: no  ?Eyes red/itching:yes ?Eye drainage/crusting: no  ?Vomiting: no ?Rash: no ?Fatigue: yes ?Sick contacts: yes ?Strep contacts: no  ?Context: worse ?Recurrent sinusitis: no ?Relief with OTC cold/cough medications: no  ?Treatments attempted: none  ? ?Relevant past medical, surgical, family and social history reviewed and updated as indicated. Interim medical history since our last visit reviewed. ?Allergies and medications reviewed and updated. ? ?Review of Systems  ?Constitutional:  Positive for fatigue. Negative for activity change, appetite change, chills, diaphoresis, fever and unexpected weight change.  ?HENT:  Positive for congestion, postnasal drip, sinus pressure and sore throat. Negative for dental problem, drooling, ear discharge, ear pain, facial swelling, hearing loss, mouth sores, nosebleeds, rhinorrhea, sinus pain, sneezing, tinnitus and trouble swallowing.   ?Eyes: Negative.   ?Respiratory:  Positive for cough, shortness of breath and wheezing. Negative for apnea, choking, chest tightness and stridor.   ?Cardiovascular: Negative.   ?Gastrointestinal: Negative.   ?Psychiatric/Behavioral: Negative.     ? ?Per HPI unless specifically indicated above ? ?   ?Objective:  ?  ?BP (!) 124/56   Pulse 76   Temp 98.5 ?F (36.9 ?C)   Wt 188 lb (85.3 kg)   SpO2 97%   BMI 32.02 kg/m?   ?Wt Readings from Last 3 Encounters:  ?06/27/21 188 lb (85.3 kg)  ?04/27/21 185 lb 12.8 oz (84.3 kg)  ?04/13/21 184 lb (83.5 kg)  ?  ?Physical Exam ?Vitals and nursing note reviewed.  ?Constitutional:   ?   General: He is not in acute distress. ?   Appearance: Normal appearance. He is not ill-appearing, toxic-appearing or diaphoretic.  ?HENT:  ?   Head: Normocephalic and atraumatic.  ?   Right Ear: Tympanic membrane, ear canal and external ear normal.  ?   Left Ear: Tympanic membrane, ear canal and external ear normal.  ?   Nose: Rhinorrhea present. No congestion.  ?   Mouth/Throat:  ?   Mouth: Mucous membranes are moist.  ?   Pharynx: Oropharynx is clear. No oropharyngeal exudate or posterior oropharyngeal erythema.  ?Eyes:  ?   General: No scleral icterus.    ?   Right eye: No discharge.     ?   Left eye: No discharge.  ?   Extraocular Movements: Extraocular movements intact.  ?   Conjunctiva/sclera: Conjunctivae normal.  ?   Pupils: Pupils are equal, round, and reactive to light.  ?Cardiovascular:  ?   Rate and Rhythm: Normal rate and regular rhythm.  ?   Pulses: Normal pulses.  ?   Heart  sounds: Normal heart sounds. No murmur heard. ?  No friction rub. No gallop.  ?Pulmonary:  ?   Effort: Pulmonary effort is normal. No respiratory distress.  ?   Breath sounds: Normal breath sounds. No stridor. No wheezing, rhonchi or rales.  ?Chest:  ?   Chest wall: No tenderness.  ?Musculoskeletal:     ?   General: Normal range of motion.  ?   Cervical back: Normal range of motion and neck supple.  ?Skin: ?   General: Skin is warm and dry.  ?   Capillary Refill: Capillary refill takes less than 2 seconds.  ?   Coloration: Skin is not jaundiced or pale.  ?   Findings: No bruising, erythema, lesion or rash.  ?Neurological:  ?   General: No focal  deficit present.  ?   Mental Status: He is alert and oriented to person, place, and time. Mental status is at baseline.  ?Psychiatric:     ?   Mood and Affect: Mood normal.     ?   Behavior: Behavior normal.     ?   Thought Content: Thought content normal.     ?   Judgment: Judgment normal.  ? ? ?Results for orders placed or performed in visit on 04/14/21  ?CUP PACEART REMOTE DEVICE CHECK  ?Result Value Ref Range  ? Date Time Interrogation Session [id number]   ? Pulse Insurance claims handler MERM   ? Pulse Gen Model [id number] Evera MRI XT DR   ? Pulse Gen Serial Number [id number]   ? Clinic Name [facility]   ? Implantable Pulse Generator Type Implantable Cardiac Defibulator   ? Implantable Pulse Generator Implant Date [id number]   ? Implantable Lead Manufacturer MERM   ? Implantable Lead Model 5076 CapSureFix Novus MRI SureScan   ? Implantable Lead Serial Number [id number]   ? Implantable Lead Implant Date [id number]   ? Implantable Lead Location Detail 1 UNKNOWN   ? Implantable Lead Location [id number]   ? Implantable Lead Manufacturer MERM   ? Implantable Lead Model [id number] Sprint Quattro Secure S MRI SureScan   ? Implantable Lead Serial Number [id number]   ? Implantable Lead Implant Date [id number]   ? Implantable Lead Location Detail 1 UNKNOWN   ? Implantable Lead Location [id number]   ? Lead Channel Setting Sensing Sensitivity 0.3 mV  ? Lead Channel Setting Pacing Amplitude 1.5 V  ? Lead Channel Setting Pacing Pulse Width 0.4 ms  ? Lead Channel Setting Pacing Amplitude 2 V  ? Lead Channel Impedance Value 456 ohm  ? Lead Channel Sensing Intrinsic Amplitude 2.375 mV  ? Lead Channel Sensing Intrinsic Amplitude 2.375 mV  ? Lead Channel Pacing Threshold Amplitude 0.625 V  ? Lead Channel Pacing Threshold Pulse Width 0.4 ms  ? Lead Channel Impedance Value 437 ohm  ? Lead Channel Impedance Value 399 ohm  ? Lead Channel Sensing Intrinsic Amplitude 21.625 mV  ? Lead Channel Sensing Intrinsic Amplitude 21.625 mV  ? Lead Channel  Pacing Threshold Amplitude 0.625 V  ? Lead Channel Pacing Threshold Pulse Width 0.4 ms  ? HighPow Impedance 77 ohm  ? Battery Status OK   ? Battery Remaining Longevity 88 mo  ? Battery Voltage 3.00 V  ? Loletha Grayer Statistic RA Percent Paced 4.01 %  ? Brady Statistic RV Percent Paced 0.13 %  ? Brady Statistic AP VP Percent 0.1 %  ? Brady Statistic AS VP Percent 0.04 %  ? Brady Statistic AP VS Percent 3.96 %  ?  Brady Statistic AS VS Percent 95.9 %  ? ?   ?Assessment & Plan:  ? ?Problem List Items Addressed This Visit   ?None ?Visit Diagnoses   ? ? Upper respiratory tract infection, unspecified type    -  Primary  ? Will treat with with burst of prednisone. Call if not getting better or getting worse. Call with any concerns.   ? ?  ?  ? ?Follow up plan: ?Return if symptoms worsen or fail to improve. ? ? ? ? ? ?

## 2021-06-30 ENCOUNTER — Ambulatory Visit (INDEPENDENT_AMBULATORY_CARE_PROVIDER_SITE_OTHER): Payer: Medicare Other | Admitting: Family Medicine

## 2021-06-30 ENCOUNTER — Encounter: Payer: Self-pay | Admitting: Family Medicine

## 2021-06-30 ENCOUNTER — Telehealth: Payer: Self-pay | Admitting: Family Medicine

## 2021-06-30 VITALS — BP 103/66 | HR 63 | Temp 98.3°F | Wt 188.0 lb

## 2021-06-30 DIAGNOSIS — I255 Ischemic cardiomyopathy: Secondary | ICD-10-CM

## 2021-06-30 DIAGNOSIS — J0100 Acute maxillary sinusitis, unspecified: Secondary | ICD-10-CM | POA: Diagnosis not present

## 2021-06-30 MED ORDER — AMOXICILLIN-POT CLAVULANATE 875-125 MG PO TABS: 1.0000 | ORAL_TABLET | Freq: Two times a day (BID) | ORAL | 0 refills | Status: DC

## 2021-06-30 NOTE — Telephone Encounter (Signed)
Copied from [place] [id number]. Topic: General - Other ?>> Jun 30, 2021  9:50 AM Yvette Rack wrote: ?Reason for CRM: Pt stated he is not getting any better with the medication he was prescribed. Pt scheduled appt for today but asked if an antibiotic could be called in to his pharmacy. Pt stated if Rx is sent in he will cancel appt for today. ?

## 2021-06-30 NOTE — Telephone Encounter (Signed)
Seen today. 

## 2021-06-30 NOTE — Progress Notes (Signed)
? ?BP 103/66   Pulse 63   Temp 98.3 ?F (36.8 ?C)   Wt 188 lb (85.3 kg)   SpO2 96%   BMI 32.02 kg/m?   ? ?Subjective:  ? ? Patient ID: Samuel Morris, male    DOB: 12/06/1945, 76 y.o.   MRN: [id number] ? ?HPI: ?Samuel Morris is a 76 y.o. male ? ?Chief Complaint  ?Patient presents with  ? URI  ?  Patient states he is not feeling better, he is now coughing up mucus   ? ?UPPER RESPIRATORY TRACT INFECTION ?Duration: 10 days ?Worst symptom: congestion ?Fever: no ?Cough: yes ?Shortness of breath: no ?Wheezing: no ?Chest pain: no ?Chest tightness: no ?Chest congestion: yes ?Nasal congestion: yes ?Runny nose: yes ?Post nasal drip: yes ?Sneezing: no ?Sore throat: no ?Swollen glands: no ?Sinus pressure: yes ?Headache: no ?Face pain: yes ?Toothache: no ?Ear pain: no  ?Ear pressure: no  ?Eyes red/itching:no ?Eye drainage/crusting: no  ?Vomiting: no ?Rash: no ?Fatigue: yes ?Sick contacts: yes ?Strep contacts: yes  ?Context: worse ?Recurrent sinusitis: no ?Relief with OTC cold/cough medications: no  ?Treatments attempted:  prednisone   ? ? ?Relevant past medical, surgical, family and social history reviewed and updated as indicated. Interim medical history since our last visit reviewed. ?Allergies and medications reviewed and updated. ? ?Review of Systems  ?Constitutional:  Positive for fatigue. Negative for activity change, appetite change, chills, diaphoresis, fever and unexpected weight change.  ?HENT:  Positive for congestion, postnasal drip, rhinorrhea, sinus pressure and sinus pain. Negative for dental problem, drooling, ear discharge, ear pain, facial swelling, hearing loss, mouth sores, nosebleeds, sneezing, sore throat, tinnitus, trouble swallowing and voice change.   ?Eyes: Negative.   ?Respiratory:  Positive for cough and chest tightness. Negative for apnea, choking, shortness of breath, wheezing and stridor.   ?Cardiovascular: Negative.   ?Musculoskeletal: Negative.   ?Psychiatric/Behavioral: Negative.    ? ?Per HPI  unless specifically indicated above ? ?   ?Objective:  ?  ?BP 103/66   Pulse 63   Temp 98.3 ?F (36.8 ?C)   Wt 188 lb (85.3 kg)   SpO2 96%   BMI 32.02 kg/m?   ?Wt Readings from Last 3 Encounters:  ?06/30/21 188 lb (85.3 kg)  ?06/27/21 188 lb (85.3 kg)  ?04/27/21 185 lb 12.8 oz (84.3 kg)  ?  ?Physical Exam ?Vitals and nursing note reviewed.  ?Constitutional:   ?   General: He is not in acute distress. ?   Appearance: Normal appearance. He is obese. He is not ill-appearing, toxic-appearing or diaphoretic.  ?HENT:  ?   Head: Normocephalic and atraumatic.  ?   Right Ear: Tympanic membrane, ear canal and external ear normal. There is no impacted cerumen.  ?   Left Ear: Tympanic membrane, ear canal and external ear normal. There is no impacted cerumen.  ?   Nose: Congestion and rhinorrhea present.  ?   Mouth/Throat:  ?   Mouth: Mucous membranes are moist.  ?   Pharynx: Oropharynx is clear. No oropharyngeal exudate or posterior oropharyngeal erythema.  ?Eyes:  ?   General: No scleral icterus.    ?   Right eye: No discharge.     ?   Left eye: No discharge.  ?   Extraocular Movements: Extraocular movements intact.  ?   Conjunctiva/sclera: Conjunctivae normal.  ?   Pupils: Pupils are equal, round, and reactive to light.  ?Cardiovascular:  ?   Rate and Rhythm: Normal rate and regular rhythm.  ?  Pulses: Normal pulses.  ?   Heart sounds: Normal heart sounds. No murmur heard. ?  No friction rub. No gallop.  ?Pulmonary:  ?   Effort: Pulmonary effort is normal. No respiratory distress.  ?   Breath sounds: Normal breath sounds. No stridor. No wheezing, rhonchi or rales.  ?Chest:  ?   Chest wall: No tenderness.  ?Musculoskeletal:     ?   General: Normal range of motion.  ?   Cervical back: Normal range of motion and neck supple.  ?Skin: ?   General: Skin is warm and dry.  ?   Capillary Refill: Capillary refill takes less than 2 seconds.  ?   Coloration: Skin is not jaundiced or pale.  ?   Findings: No bruising, erythema, lesion  or rash.  ?Neurological:  ?   General: No focal deficit present.  ?   Mental Status: He is alert and oriented to person, place, and time. Mental status is at baseline.  ?Psychiatric:     ?   Mood and Affect: Mood normal.     ?   Behavior: Behavior normal.     ?   Thought Content: Thought content normal.     ?   Judgment: Judgment normal.  ? ? ?Results for orders placed or performed in visit on 04/14/21  ?CUP PACEART REMOTE DEVICE CHECK  ?Result Value Ref Range  ? Date Time Interrogation Session [phone]   ? Pulse Insurance claims handler MERM   ? Pulse Gen Model [id number] Evera MRI XT DR   ? Pulse Gen Serial Number [id number]   ? Clinic Name [facility]   ? Implantable Pulse Generator Type Implantable Cardiac Defibulator   ? Implantable Pulse Generator Implant Date [id number]   ? Implantable Lead Manufacturer MERM   ? Implantable Lead Model 5076 CapSureFix Novus MRI SureScan   ? Implantable Lead Serial Number [id number]   ? Implantable Lead Implant Date [id number]   ? Implantable Lead Location Detail 1 UNKNOWN   ? Implantable Lead Location [id number]   ? Implantable Lead Manufacturer MERM   ? Implantable Lead Model [id number] Sprint Quattro Secure S MRI SureScan   ? Implantable Lead Serial Number [id number]   ? Implantable Lead Implant Date [id number]   ? Implantable Lead Location Detail 1 UNKNOWN   ? Implantable Lead Location [phone]   ? Lead Channel Setting Sensing Sensitivity 0.3 mV  ? Lead Channel Setting Pacing Amplitude 1.5 V  ? Lead Channel Setting Pacing Pulse Width 0.4 ms  ? Lead Channel Setting Pacing Amplitude 2 V  ? Lead Channel Impedance Value 456 ohm  ? Lead Channel Sensing Intrinsic Amplitude 2.375 mV  ? Lead Channel Sensing Intrinsic Amplitude 2.375 mV  ? Lead Channel Pacing Threshold Amplitude 0.625 V  ? Lead Channel Pacing Threshold Pulse Width 0.4 ms  ? Lead Channel Impedance Value 437 ohm  ? Lead Channel Impedance Value 399 ohm  ? Lead Channel Sensing Intrinsic Amplitude 21.625 mV  ? Lead Channel Sensing  Intrinsic Amplitude 21.625 mV  ? Lead Channel Pacing Threshold Amplitude 0.625 V  ? Lead Channel Pacing Threshold Pulse Width 0.4 ms  ? HighPow Impedance 77 ohm  ? Battery Status OK   ? Battery Remaining Longevity 88 mo  ? Battery Voltage 3.00 V  ? Loletha Grayer Statistic RA Percent Paced 4.01 %  ? Brady Statistic RV Percent Paced 0.13 %  ? Brady Statistic AP VP Percent 0.1 %  ? Brady Statistic AS VP Percent 0.04 %  ? Loletha Grayer  Statistic AP VS Percent 3.96 %  ? Brady Statistic AS VS Percent 95.9 %  ? ?   ?Assessment & Plan:  ? ?Problem List Items Addressed This Visit   ?None ?Visit Diagnoses   ? ? Acute non-recurrent maxillary sinusitis    -  Primary  ? Finish prednisone. If not better in 2 days, will start augmentin. Rx sent to his pharmacy. Call with any concerns.   ? Relevant Medications  ? amoxicillin-clavulanate (AUGMENTIN) 875-125 MG tablet  ? ?  ?  ? ?Follow up plan: ?Return if symptoms worsen or fail to improve. ? ? ? ? ? ?

## 2021-07-05 NOTE — Telephone Encounter (Signed)
Attempted to schedule.  LMOV to call office.  ° °

## 2021-07-10 ENCOUNTER — Telehealth: Payer: Self-pay

## 2021-07-10 NOTE — Chronic Care Management (AMB) (Signed)
? ? ?Chronic Care Management ?Pharmacy Assistant  ? ?Name: Samuel Morris  MRN: [id number] DOB: 07-11-1945 ? ? ?Reason for Encounter: Assessment call General  ? ?Recent office visits:  ?06/30/21-[name], MD (PCP) Seen for Upper respiratory tract infection. Start on amoxicillin-clavulanate (AUGMENTIN) 875-125 MG tablet. Return if symptoms worsen or fail to improve. ?06/27/21-[name], MD (PCP) Seen for a cough. Start on Prednisone 50 mg daily. Return if symptoms worsen or fail to improve. ?04/27/21-[name], MD (PCP) Seen for  cough. Chest xray ordered. Start on benzonatate (TESSALON) 200 MG capsule twice daily. ?04/13/21-[name], MD (PCP) General follow up visit. Labs ordered. Follow up in 6 months. ? ?Recent consult visits:  ?06/27/21-[name], MD (Podiatry) Seen for nail trim.  ?05/24/21-Noriega, Bary Richard, PA-C (Rheumatology) Follow up visit for RA and ILD. Follow up in 3 months. ?05/23/21-Hanson, Kevin Fenton, MD (Orthopedic) Seen for an evaluation of the right knee.  ?04/25/21-Ngongang, Kandyce Rud, MD (Cardiology) Follow up visit. EKG ordered. ?04/24/21-Figler, Hulda Marin, MD (Urology) Seen for urinary incontinence and urgency.  ?03/28/21-[name], MD (Podiatry) Seen for a nail trim.  ? ?Hospital visits:  ?None in previous 6 months ? ?Medications: ?Outpatient Encounter Medications as of 07/10/2021  ?Medication Sig  ? albuterol (VENTOLIN HFA) 108 (90 Base) MCG/ACT inhaler Inhale 1 puff into the lungs every 4 (four) hours as needed for wheezing or shortness of breath.  ? amoxicillin-clavulanate (AUGMENTIN) 875-125 MG tablet Take 1 tablet by mouth 2 (two) times daily.  ? apixaban (ELIQUIS) 5 MG TABS tablet TAKE 1 TABLET BY MOUTH TWICE A DAY  ? Ascorbic Acid (VITAMIN C PO) Take 100 mg by mouth daily.  ? aspirin EC 81 MG tablet Take 81 mg by mouth daily.  ? benzonatate (TESSALON) 200 MG capsule Take 1 capsule (200 mg total) by mouth 2 (two) times daily as  needed for cough. (Patient not taking: Reported on 06/27/2021)  ? Calcium-Magnesium 100-50 MG TABS Take 1 tablet by mouth daily.  ? Cholecalciferol (VITAMIN D-1000 MAX ST) 25 MCG (1000 UT) tablet Take 1,000 Units by mouth daily.  ? dapagliflozin propanediol (FARXIGA) 5 MG TABS tablet Take 1 tablet (5 mg total) by mouth daily.  ? diclofenac Sodium (VOLTAREN) 1 % GEL   ? ENTRESTO 49-51 MG Take 1 tablet by mouth 2 (two) times daily.  ? ezetimibe (ZETIA) 10 MG tablet TAKE 1 TABLET(10 MG) BY MOUTH DAILY  ? folic acid (FOLVITE) 1 MG tablet 1 tablet  ? furosemide (LASIX) 40 MG tablet Take 1 tablet (40 mg total) by mouth daily.  ? gabapentin (NEURONTIN) 100 MG capsule Take 100 mg by mouth 3 (three) times daily. (Patient not taking: Reported on 04/13/2021)  ? hydroxychloroquine (PLAQUENIL) 200 MG tablet Take 200 mg by mouth 2 (two) times daily.  ? isosorbide mononitrate (IMDUR) 30 MG 24 hr tablet Take 0.5 tablets (15 mg total) by mouth daily.  ? methotrexate (RHEUMATREX) 2.5 MG tablet Take 15 mg by mouth once a week.  ? metoprolol succinate (TOPROL-XL) 100 MG 24 hr tablet Take 100 mg by mouth daily. Take with or immediately following a meal.  ? nitroGLYCERIN (NITROSTAT) 0.4 MG SL tablet Place 1 tablet (0.4 mg total) under the tongue every 5 (five) minutes as needed for chest pain.  ? predniSONE (DELTASONE) 50 MG tablet Take 1 tablet (50 mg total) by mouth daily with breakfast.  ? rosuvastatin (CRESTOR) 20 MG tablet TAKE 1 TABLET BY MOUTH AT BEDTIME  ? spironolactone (ALDACTONE)  25 MG tablet Take 1 tablet (25 mg total) by mouth daily.  ? Turmeric (QC TUMERIC COMPLEX PO) Take by mouth daily. Unsure of dose  ? vitamin B-12 (CYANOCOBALAMIN) 1000 MCG tablet Take by mouth daily.  ? ?No facility-administered encounter medications on file as of 07/10/2021.  ? ?Contacted Lannie Cea for [employer] Review Call ? ? ?Chart Review: ? ?Have there been any documented new, changed, or discontinued medications since last visit? Yes  ?Themporay  medications like Tesslaon pearls, Prednisone ? ?Has there been any documented recent hospitalizations or ED visits since last visit with Clinical Pharmacist? No ? ? ? ?Adherence Review: ? ?Does the Clinical Pharmacist Assistant have access to adherence rates? Yes ? ?Adherence rates for STAR metric medications (List medication(s)/day supply/ last 2 fill dates). Listed at the bottom of the note. ? ?Does the patient have >5 day gap between last estimated fill dates for any of the above medications or other medication gaps? No ?Reason for medication gaps.No ? ? ?Disease State Questions: ? ?Able to connect with Patient? Yes ? ?Did patient have any problems with their health recently? No ?Note problems and Concerns:N/a ? ? ?Have you had any admissions or emergency room visits or worsening of your condition(s) since last visit? No ?Details of ED visit, hospital visit and/or worsening condition(s):N/a ? ? ?Have you had any visits with new specialists or providers since your last visit? No ? ? ?Have you had any new health care problem(s) since your last visit? No ?New problem(s) reported:N/a ? ?Have you run out of any of your medications since you last spoke with clinical pharmacist? No ?What caused you to run out of your medications?N/a ? ?Are there any medications you are not taking as prescribed? No ?What kept you from taking your medications as prescribed?N/a ? ?Are you having any issues or side effects with your medications? No ?Note of issues or side effects:N/a ? ?Do you have any other health concerns or questions you want to discuss with your Clinical Pharmacist before your next visit? No ?Note additional concerns and questions from Patient. ? ?Are there any health concerns that you feel we can do a better job addressing? No ?Note Patient's response. ? ?Are you having any problems with any of the following since the last visit: (select all that apply) ? None ? Details:N/a ? ?50. Any falls since last visit?  No ? Details:N/a ? ?13. Any increased or uncontrolled pain since last visit? No ? Details: ? ?14. Next visit Type: telephone ?      Visit with:Jacob Potts, CPP ?       Date:09/18/21 ?       Time:3:00pm ? ?15. Additional Details? No  ? ?I have called patients [employer] for his Eliquis medication, patient was not sure if he needed to renew his application or need a refill prescription sent in. I have call [employer] and they stated the patient does need to re-apply. The company will mail over application directly to the patient. I have called and explained to the patient that he should be receiving the application by [facility] through the mail. Patients wife request the form to be faxed at [facility] family practice. I called back again [facility] and they are able to fax it and should be receiving the application with in 10 mins. I called the patient again and mentioned to him that the application should be at the office in 10 mins and should be able to pick it up. ? ? ?  Care Gaps: ?Zoster Vaccines:Never done ?OPHTHALMOLOGY EXAM:Last ordered: Oct 11, 2020 ? ?Star Rating Drugs: ?Farxiga 5 mg Last filled:03/29/21 30 DS ?Rosuvastatin 20 mg Last filled:06/20/21 90 DS ?  ?Corrie Mckusick, RMA ?Health Concierge ? ?

## 2021-07-14 ENCOUNTER — Ambulatory Visit (INDEPENDENT_AMBULATORY_CARE_PROVIDER_SITE_OTHER): Payer: Medicare Other

## 2021-07-14 DIAGNOSIS — I255 Ischemic cardiomyopathy: Secondary | ICD-10-CM | POA: Diagnosis not present

## 2021-07-14 LAB — CUP PACEART REMOTE DEVICE CHECK
Battery Remaining Longevity: 84 mo
Battery Voltage: 2.99 V
Brady Statistic AP VP Percent: 0.15 %
Brady Statistic AP VS Percent: 5.65 %
Brady Statistic AS VP Percent: 0.04 %
Brady Statistic AS VS Percent: 94.16 %
Brady Statistic RA Percent Paced: 5.68 %
Brady Statistic RV Percent Paced: 0.18 %
Date Time Interrogation Session: 20230505043725
HighPow Impedance: 69 Ohm
Implantable Lead Implant Date: 20121205
Implantable Lead Implant Date: 20121205
Implantable Lead Location: 753859
Implantable Lead Location: 753860
Implantable Lead Model: 5076
Implantable Pulse Generator Implant Date: 20190702
Lead Channel Impedance Value: 380 Ohm
Lead Channel Impedance Value: 437 Ohm
Lead Channel Impedance Value: 456 Ohm
Lead Channel Pacing Threshold Amplitude: 0.75 V
Lead Channel Pacing Threshold Amplitude: 0.75 V
Lead Channel Pacing Threshold Pulse Width: 0.4 ms
Lead Channel Pacing Threshold Pulse Width: 0.4 ms
Lead Channel Sensing Intrinsic Amplitude: 19.875 mV
Lead Channel Sensing Intrinsic Amplitude: 19.875 mV
Lead Channel Sensing Intrinsic Amplitude: 2 mV
Lead Channel Sensing Intrinsic Amplitude: 2 mV
Lead Channel Setting Pacing Amplitude: 1.5 V
Lead Channel Setting Pacing Amplitude: 2 V
Lead Channel Setting Pacing Pulse Width: 0.4 ms
Lead Channel Setting Sensing Sensitivity: 0.3 mV

## 2021-07-18 ENCOUNTER — Ambulatory Visit: Payer: Self-pay

## 2021-07-18 ENCOUNTER — Telehealth (INDEPENDENT_AMBULATORY_CARE_PROVIDER_SITE_OTHER): Payer: Medicare Other | Admitting: Internal Medicine

## 2021-07-18 DIAGNOSIS — J069 Acute upper respiratory infection, unspecified: Secondary | ICD-10-CM | POA: Insufficient documentation

## 2021-07-18 MED ORDER — AZITHROMYCIN 250 MG PO TABS: ORAL_TABLET | ORAL | 0 refills | Status: AC

## 2021-07-18 MED ORDER — BENZONATATE 200 MG PO CAPS: 200.0000 mg | ORAL_CAPSULE | Freq: Two times a day (BID) | ORAL | 0 refills | Status: DC | PRN

## 2021-07-18 MED ORDER — FEXOFENADINE HCL 180 MG PO TABS: 180.0000 mg | ORAL_TABLET | Freq: Every day | ORAL | 1 refills | Status: DC

## 2021-07-18 NOTE — Progress Notes (Signed)
? ?There were no vitals taken for this visit.  ? ?Subjective:  ? ? Patient ID: Samuel Morris, male    DOB: [birth year], 76 y.o.   MRN: [id number] ? ?No chief complaint on file. ? ? ?HPI: ?Samuel Morris is a 76 y.o. male ? ? ?This visit was completed via telephone due to the restrictions of the COVID-19 pandemic. All issues as above were discussed and addressed but no physical exam was performed. If it was felt that the patient should be evaluated in the office, they were directed there. The patient verbally consented to this visit. Patient was unable to complete an audio/visual visit due to Technical difficulties. Due to the catastrophic nature of the COVID-19 pandemic, this visit was done through audio contact only. ?Location of the patient: home ?Location of the provider: work ?Those involved with this call:  ?Provider: Charlynne Cousins, MD ?CMA: Frazier Butt, CMA ?Front Desk/Registration: [employer]  ?Time spent on call: 10 minutes on the phone discussing health concerns. 10 minutes total spent in review of patient's record and preparation of their chart. ? ? ? ?URI  ?This is a new (dark yellow - green mucus in the mornings , during the day less productive per pt is constant cough) problem. There has been no fever. Associated symptoms include congestion, coughing and headaches. Pertinent negatives include no abdominal pain, chest pain, joint pain, joint swelling, nausea, neck pain, plugged ear sensation, rash, rhinorrhea, sinus pain, sneezing, sore throat, swollen glands, vomiting or wheezing.  ? ?No chief complaint on file. ? ? ?Relevant past medical, surgical, family and social history reviewed and updated as indicated. Interim medical history since our last visit reviewed. ?Allergies and medications reviewed and updated. ? ?Review of Systems  ?HENT:  Positive for congestion. Negative for rhinorrhea, sinus pain, sneezing and sore throat.   ?Respiratory:  Positive for cough. Negative for wheezing.   ?Cardiovascular:   Negative for chest pain.  ?Gastrointestinal:  Negative for abdominal pain, nausea and vomiting.  ?Musculoskeletal:  Negative for joint pain and neck pain.  ?Skin:  Negative for rash.  ?Neurological:  Positive for headaches.  ? ?Per HPI unless specifically indicated above ? ?   ?Objective:  ?  ?There were no vitals taken for this visit.  ?Wt Readings from Last 3 Encounters:  ?06/30/21 188 lb (85.3 kg)  ?06/27/21 188 lb (85.3 kg)  ?04/27/21 185 lb 12.8 oz (84.3 kg)  ?  ?Physical Exam ?   ? ? ?Current Outpatient Medications:  ?  azithromycin (ZITHROMAX) 250 MG tablet, Take 2 tablets on day 1, then 1 tablet daily on days 2 through 5, Disp: 6 tablet, Rfl: 0 ?  fexofenadine (ALLEGRA ALLERGY) 180 MG tablet, Take 1 tablet (180 mg total) by mouth daily., Disp: 10 tablet, Rfl: 1 ?  albuterol (VENTOLIN HFA) 108 (90 Base) MCG/ACT inhaler, Inhale 1 puff into the lungs every 4 (four) hours as needed for wheezing or shortness of breath., Disp: 18 g, Rfl: 3 ?  amoxicillin-clavulanate (AUGMENTIN) 875-125 MG tablet, Take 1 tablet by mouth 2 (two) times daily., Disp: 20 tablet, Rfl: 0 ?  apixaban (ELIQUIS) 5 MG TABS tablet, TAKE 1 TABLET BY MOUTH TWICE A DAY, Disp: 60 tablet, Rfl: 5 ?  Ascorbic Acid (VITAMIN C PO), Take 100 mg by mouth daily., Disp: , Rfl:  ?  aspirin EC 81 MG tablet, Take 81 mg by mouth daily., Disp: , Rfl:  ?  benzonatate (TESSALON) 200 MG capsule, Take 1 capsule (200 mg total) by mouth  2 (two) times daily as needed for cough., Disp: 20 capsule, Rfl: 0 ?  Calcium-Magnesium 100-50 MG TABS, Take 1 tablet by mouth daily., Disp: , Rfl:  ?  Cholecalciferol (VITAMIN D-1000 MAX ST) 25 MCG (1000 UT) tablet, Take 1,000 Units by mouth daily., Disp: , Rfl:  ?  dapagliflozin propanediol (FARXIGA) 5 MG TABS tablet, Take 1 tablet (5 mg total) by mouth daily., Disp: 90 tablet, Rfl: 1 ?  diclofenac Sodium (VOLTAREN) 1 % GEL, , Disp: , Rfl:  ?  ENTRESTO 49-51 MG, Take 1 tablet by mouth 2 (two) times daily., Disp: , Rfl:  ?   ezetimibe (ZETIA) 10 MG tablet, TAKE 1 TABLET(10 MG) BY MOUTH DAILY, Disp: 90 tablet, Rfl: 1 ?  folic acid (FOLVITE) 1 MG tablet, 1 tablet, Disp: , Rfl:  ?  furosemide (LASIX) 40 MG tablet, Take 1 tablet (40 mg total) by mouth daily., Disp: 30 tablet, Rfl: 1 ?  gabapentin (NEURONTIN) 100 MG capsule, Take 100 mg by mouth 3 (three) times daily. (Patient not taking: Reported on 04/13/2021), Disp: , Rfl:  ?  hydroxychloroquine (PLAQUENIL) 200 MG tablet, Take 200 mg by mouth 2 (two) times daily., Disp: , Rfl:  ?  isosorbide mononitrate (IMDUR) 30 MG 24 hr tablet, Take 0.5 tablets (15 mg total) by mouth daily., Disp: 45 tablet, Rfl: 0 ?  methotrexate (RHEUMATREX) 2.5 MG tablet, Take 15 mg by mouth once a week., Disp: , Rfl:  ?  metoprolol succinate (TOPROL-XL) 100 MG 24 hr tablet, Take 100 mg by mouth daily. Take with or immediately following a meal., Disp: , Rfl:  ?  nitroGLYCERIN (NITROSTAT) 0.4 MG SL tablet, Place 1 tablet (0.4 mg total) under the tongue every 5 (five) minutes as needed for chest pain., Disp: 25 tablet, Rfl: 3 ?  predniSONE (DELTASONE) 50 MG tablet, Take 1 tablet (50 mg total) by mouth daily with breakfast., Disp: 5 tablet, Rfl: 0 ?  rosuvastatin (CRESTOR) 20 MG tablet, TAKE 1 TABLET BY MOUTH AT BEDTIME, Disp: 90 tablet, Rfl: 1 ?  spironolactone (ALDACTONE) 25 MG tablet, Take 1 tablet (25 mg total) by mouth daily., Disp: 90 tablet, Rfl: 1 ?  Turmeric (QC TUMERIC COMPLEX PO), Take by mouth daily. Unsure of dose, Disp: , Rfl:  ?  vitamin B-12 (CYANOCOBALAMIN) 1000 MCG tablet, Take by mouth daily., Disp: , Rfl:   ? ? ?Assessment & Plan:  ?Acute URI:  ?COVID test home test is -ve ?pt advised to take Tylenol q 4- 6 hourly as needed. pt to take allegra q pm as needed and to call office if symptoms worsened pt verbalised understanding of such. ? ?  ? ?Problem List Items Addressed This Visit   ? ?  ? Respiratory  ? Upper respiratory tract infection - Primary  ? Relevant Medications  ? azithromycin (ZITHROMAX)  250 MG tablet  ?  ? ?No orders of the defined types were placed in this encounter. ?  ? ?Meds ordered this encounter  ?Medications  ? azithromycin (ZITHROMAX) 250 MG tablet  ?  Sig: Take 2 tablets on day 1, then 1 tablet daily on days 2 through 5  ?  Dispense:  6 tablet  ?  Refill:  0  ? benzonatate (TESSALON) 200 MG capsule  ?  Sig: Take 1 capsule (200 mg total) by mouth 2 (two) times daily as needed for cough.  ?  Dispense:  20 capsule  ?  Refill:  0  ? fexofenadine (ALLEGRA ALLERGY) 180 MG tablet  ?  Sig: Take 1 tablet (180 mg total) by mouth daily.  ?  Dispense:  10 tablet  ?  Refill:  1  ?  ? ?Follow up plan: ?No follow-ups on file. ? ? ?

## 2021-07-18 NOTE — Telephone Encounter (Signed)
The patient has experienced sinus discomfort for roughly a week  ? ?The patient's wife shares that the patient's congestion is green and yellow in color  ? ?Please contact further when possible ? ? ? ?Chief Complaint: Productive cough with green mucus ?Symptoms: Runny nose ?Frequency: 3 weeks ago ?Pertinent Negatives: Patient denies fever ?Disposition: '[]'$ ED /'[]'$ Urgent Care (no appt availability in office) / '[x]'$ Appointment(In office/virtual)/ '[]'$  [place] [facility]/ '[]'$ Home Care/ '[]'$ Refused Recommended Disposition /'[]'$ [place] [facility]/ '[]'$  Follow-up with PCP ?Additional Notes:   ?Reason for Disposition ? [1] Continuous (nonstop) coughing interferes with work or school AND [2] no improvement using cough treatment per Care Advice ? ?Answer Assessment - Initial Assessment Questions ?1. ONSET: "When did the cough begin?"  ?    3 weeks ago ?2. SEVERITY: "How bad is the cough today?"  ?    Moderate ?3. SPUTUM: "Describe the color of your sputum" (none, dry cough; clear, white, yellow, green) ?    Yellow-green ?4. HEMOPTYSIS: "Are you coughing up any blood?" If so ask: "How much?" (flecks, streaks, tablespoons, etc.) ?    nO ?5. DIFFICULTY BREATHING: "Are you having difficulty breathing?" If Yes, ask: "How bad is it?" (e.g., mild, moderate, severe)  ?  - MILD: No SOB at rest, mild SOB with walking, speaks normally in sentences, can lie down, no retractions, pulse < 100.  ?  - MODERATE: SOB at rest, SOB with minimal exertion and prefers to sit, cannot lie down flat, speaks in phrases, mild retractions, audible wheezing, pulse 100-120.  ?  - SEVERE: Very SOB at rest, speaks in single words, struggling to breathe, sitting hunched forward, retractions, pulse > 120  ?    Mild ?6. FEVER: "Do you have a fever?" If Yes, ask: "What is your temperature, how was it measured, and when did it start?" ?    No ?7. CARDIAC HISTORY: "Do you have any history of heart disease?" (e.g., heart attack, congestive heart failure)  ?     Yes ?8. LUNG HISTORY: "Do you have any history of lung disease?"  (e.g., pulmonary embolus, asthma, emphysema) ?    Yes ?9. PE RISK FACTORS: "Do you have a history of blood clots?" (or: recent major surgery, recent prolonged travel, bedridden) ?    No ?10. OTHER SYMPTOMS: "Do you have any other symptoms?" (e.g., runny nose, wheezing, chest pain) ?      Runny nose ?11. PREGNANCY: "Is there any chance you are pregnant?" "When was your last menstrual period?" ?      N/a ?12. TRAVEL: "Have you traveled out of the country in the last month?" (e.g., travel history, exposures) ?      No ? ?Protocols used: Cough - Acute Productive-A-AH ? ?

## 2021-07-21 ENCOUNTER — Encounter: Payer: Self-pay | Admitting: Internal Medicine

## 2021-07-25 ENCOUNTER — Ambulatory Visit (INDEPENDENT_AMBULATORY_CARE_PROVIDER_SITE_OTHER): Payer: Medicare Other | Admitting: Family Medicine

## 2021-07-25 ENCOUNTER — Encounter: Payer: Self-pay | Admitting: Family Medicine

## 2021-07-25 ENCOUNTER — Other Ambulatory Visit: Payer: Self-pay | Admitting: Family Medicine

## 2021-07-25 VITALS — BP 124/72 | HR 71 | Temp 98.0°F | Wt 184.0 lb

## 2021-07-25 DIAGNOSIS — R052 Subacute cough: Secondary | ICD-10-CM

## 2021-07-25 DIAGNOSIS — I255 Ischemic cardiomyopathy: Secondary | ICD-10-CM | POA: Diagnosis not present

## 2021-07-25 MED ORDER — ALBUTEROL SULFATE HFA 108 (90 BASE) MCG/ACT IN AERS: 1.0000 | INHALATION_SPRAY | RESPIRATORY_TRACT | 3 refills | Status: DC | PRN

## 2021-07-25 MED ORDER — BUDESONIDE-FORMOTEROL FUMARATE 160-4.5 MCG/ACT IN AERO: 2.0000 | INHALATION_SPRAY | Freq: Two times a day (BID) | RESPIRATORY_TRACT | 3 refills | Status: DC

## 2021-07-25 NOTE — Progress Notes (Signed)
? ?BP 124/72   Pulse 71   Temp 98 ?F (36.7 ?C)   Wt 184 lb (83.5 kg)   SpO2 96%   BMI 31.34 kg/m?   ? ?Subjective:  ? ? Patient ID: Samuel Morris, male    DOB: 03/15/1945, 76 y.o.   MRN: [id number] ? ?HPI: ?Samuel Morris is a 76 y.o. male ? ?Chief Complaint  ?Patient presents with  ? Cough  ?  Patient states he is still coughing and has chest congestion. Patient states he is wheezing.   ? ?UPPER RESPIRATORY TRACT INFECTION ?Duration: 4 weeks ?Worst symptom: ?Fever: no ?Cough: yes ?Shortness of breath: yes ?Wheezing: yes ?Chest pain: no ?Chest tightness: yes ?Chest congestion: yes ?Nasal congestion: no ?Runny nose: yes ?Post nasal drip: no ?Sneezing: no ?Sore throat: no ?Swollen glands: no ?Sinus pressure: no ?Headache: no ?Face pain: no ?Toothache: no ?Ear pain: no  ?Ear pressure: no  ?Eyes red/itching:no ?Eye drainage/crusting: no  ?Vomiting: no ?Rash: no ?Fatigue: yes ?Sick contacts: no ?Strep contacts: no  ?Context: stable ?Recurrent sinusitis: no ?Relief with OTC cold/cough medications: no  ?Treatments attempted: cold/sinus, mucinex, anti-histamine, pseudoephedrine, and antibiotics  ? ?Relevant past medical, surgical, family and social history reviewed and updated as indicated. Interim medical history since our last visit reviewed. ?Allergies and medications reviewed and updated. ? ?Review of Systems  ?Constitutional: Negative.   ?HENT: Negative.    ?Respiratory:  Positive for cough, chest tightness, shortness of breath and wheezing. Negative for apnea, choking and stridor.   ?Cardiovascular: Negative.   ?Gastrointestinal: Negative.   ?Neurological: Negative.   ?Psychiatric/Behavioral: Negative.    ? ?Per HPI unless specifically indicated above ? ?   ?Objective:  ?  ?BP 124/72   Pulse 71   Temp 98 ?F (36.7 ?C)   Wt 184 lb (83.5 kg)   SpO2 96%   BMI 31.34 kg/m?   ?Wt Readings from Last 3 Encounters:  ?07/25/21 184 lb (83.5 kg)  ?06/30/21 188 lb (85.3 kg)  ?06/27/21 188 lb (85.3 kg)  ?  ?Physical Exam ?Vitals  and nursing note reviewed.  ?Constitutional:   ?   General: He is not in acute distress. ?   Appearance: Normal appearance. He is not ill-appearing, toxic-appearing or diaphoretic.  ?HENT:  ?   Head: Normocephalic and atraumatic.  ?   Right Ear: External ear normal.  ?   Left Ear: External ear normal.  ?   Nose: Nose normal.  ?   Mouth/Throat:  ?   Mouth: Mucous membranes are moist.  ?   Pharynx: Oropharynx is clear.  ?Eyes:  ?   General: No scleral icterus.    ?   Right eye: No discharge.     ?   Left eye: No discharge.  ?   Extraocular Movements: Extraocular movements intact.  ?   Conjunctiva/sclera: Conjunctivae normal.  ?   Pupils: Pupils are equal, round, and reactive to light.  ?Cardiovascular:  ?   Rate and Rhythm: Normal rate and regular rhythm.  ?   Pulses: Normal pulses.  ?   Heart sounds: Normal heart sounds. No murmur heard. ?  No friction rub. No gallop.  ?Pulmonary:  ?   Effort: Pulmonary effort is normal. No respiratory distress.  ?   Breath sounds: No stridor. Wheezing present. No rhonchi or rales.  ?Chest:  ?   Chest wall: No tenderness.  ?Musculoskeletal:     ?   General: Normal range of motion.  ?   Cervical back: Normal  range of motion and neck supple.  ?Skin: ?   General: Skin is warm and dry.  ?   Capillary Refill: Capillary refill takes less than 2 seconds.  ?   Coloration: Skin is not jaundiced or pale.  ?   Findings: No bruising, erythema, lesion or rash.  ?Neurological:  ?   General: No focal deficit present.  ?   Mental Status: He is alert and oriented to person, place, and time. Mental status is at baseline.  ?Psychiatric:     ?   Mood and Affect: Mood normal.     ?   Behavior: Behavior normal.     ?   Thought Content: Thought content normal.     ?   Judgment: Judgment normal.  ? ? ?Results for orders placed or performed in visit on 07/14/21  ?CUP PACEART REMOTE DEVICE CHECK  ?Result Value Ref Range  ? Date Time Interrogation Session [id number]   ? Pulse Insurance claims handler MERM    ? Pulse Gen Model [id number] Evera MRI XT DR   ? Pulse Gen Serial Number [id number]   ? Clinic Name [facility]   ? Implantable Pulse Generator Type Implantable Cardiac Defibulator   ? Implantable Pulse Generator Implant Date [id number]   ? Implantable Lead Manufacturer MERM   ? Implantable Lead Model 5076 CapSureFix Novus MRI SureScan   ? Implantable Lead Serial Number [id number]   ? Implantable Lead Implant Date [id number]   ? Implantable Lead Location Detail 1 UNKNOWN   ? Implantable Lead Location [id number]   ? Implantable Lead Manufacturer MERM   ? Implantable Lead Model [id number] Sprint Quattro Secure S MRI SureScan   ? Implantable Lead Serial Number [id number]   ? Implantable Lead Implant Date [id number]   ? Implantable Lead Location Detail 1 UNKNOWN   ? Implantable Lead Location [phone]   ? Lead Channel Setting Sensing Sensitivity 0.3 mV  ? Lead Channel Setting Pacing Amplitude 1.5 V  ? Lead Channel Setting Pacing Pulse Width 0.4 ms  ? Lead Channel Setting Pacing Amplitude 2 V  ? Lead Channel Impedance Value 456 ohm  ? Lead Channel Sensing Intrinsic Amplitude 2 mV  ? Lead Channel Sensing Intrinsic Amplitude 2 mV  ? Lead Channel Pacing Threshold Amplitude 0.75 V  ? Lead Channel Pacing Threshold Pulse Width 0.4 ms  ? Lead Channel Impedance Value 437 ohm  ? Lead Channel Impedance Value 380 ohm  ? Lead Channel Sensing Intrinsic Amplitude 19.875 mV  ? Lead Channel Sensing Intrinsic Amplitude 19.875 mV  ? Lead Channel Pacing Threshold Amplitude 0.75 V  ? Lead Channel Pacing Threshold Pulse Width 0.4 ms  ? HighPow Impedance 69 ohm  ? Battery Status OK   ? Battery Remaining Longevity 84 mo  ? Battery Voltage 2.99 V  ? Loletha Grayer Statistic RA Percent Paced 5.68 %  ? Brady Statistic RV Percent Paced 0.18 %  ? Brady Statistic AP VP Percent 0.15 %  ? Brady Statistic AS VP Percent 0.04 %  ? Brady Statistic AP VS Percent 5.65 %  ? Brady Statistic AS VS Percent 94.16 %  ? ?   ?Assessment & Plan:  ? ?Problem List Items Addressed This Visit    ?None ?Visit Diagnoses   ? ? Subacute cough    -  Primary  ? Spiro normal. Will get CXR and start symbicort. Follow up with pulmonology. Call with any concerns or if not getting better.   ? Relevant Orders  ? Spirometry with graph (Completed)  ?  DG Chest 2 View  ? ?  ?  ? ?Follow up plan: ?Return 2-3 weeeks. ? ? ? ? ? ?

## 2021-07-26 ENCOUNTER — Ambulatory Visit
Admission: RE | Admit: 2021-07-26 | Discharge: 2021-07-26 | Disposition: A | Discharge status: Home / Self Care | Payer: Medicare Other | Source: Ambulatory Visit | Attending: Family Medicine | Admitting: Family Medicine

## 2021-07-26 ENCOUNTER — Ambulatory Visit
Admission: RE | Admit: 2021-07-26 | Discharge: 2021-07-26 | Disposition: A | Discharge status: Home / Self Care | Payer: Medicare Other | Attending: Family Medicine | Admitting: Family Medicine

## 2021-07-26 ENCOUNTER — Ambulatory Visit: Payer: Self-pay | Admitting: *Deleted

## 2021-07-26 ENCOUNTER — Other Ambulatory Visit: Payer: Self-pay | Admitting: Family Medicine

## 2021-07-26 DIAGNOSIS — R052 Subacute cough: Secondary | ICD-10-CM

## 2021-07-26 NOTE — Telephone Encounter (Signed)
Patient checking on the status of message below °

## 2021-07-26 NOTE — Telephone Encounter (Signed)
Requested medication (s) are due for refill today: Yes ? ?Requested medication (s) are on the active medication list: No ? ?Last refill:  07/25/21 ? ?Future visit scheduled: Yes ? ?Notes to clinic:  Unable to refill per protocol, med originally ordered is not available per pharmacy, Pharmacy comment: Alternative Requested. ? ? ? ? ?Requested Prescriptions  ?Pending Prescriptions Disp Refills  ? [name] ELLIPTA 200-25 MCG/ACT AEPB [Pharmacy Med Name: [name] ELLIPTA 200-25 MCG INH]  0  ?  ? There is no refill protocol information for this order  ?  ? ? ? ? ?

## 2021-07-26 NOTE — Telephone Encounter (Signed)
Requested Prescriptions  ?Pending Prescriptions Disp Refills  ?? isosorbide mononitrate (IMDUR) 30 MG 24 hr tablet [Pharmacy Med Name: ISOSORBIDE MONONIT ER 30 MG TB] 45 tablet 0  ?  Sig: TAKE 1/2 OF A TABLET (15 MG TOTAL) BY MOUTH DAILY  ?  ? Cardiovascular:  Nitrates Passed - 07/26/2021 10:37 AM  ?  ?  Passed - Last BP in normal range  ?  BP Readings from Last 1 Encounters:  ?07/25/21 124/72  ?   ?  ?  Passed - Last Heart Rate in normal range  ?  Pulse Readings from Last 1 Encounters:  ?07/25/21 71  ?   ?  ?  Passed - Valid encounter within last 12 months  ?  Recent Outpatient Visits   ?      ? Yesterday Subacute cough  ? [place], [place] P, DO  ? 1 week ago Upper respiratory tract infection, unspecified type  ? [facility], [place], MD  ? 3 weeks ago Acute non-recurrent maxillary sinusitis  ? [place], [place] P, DO  ? 4 weeks ago Upper respiratory tract infection, unspecified type  ? [place], Megan P, DO  ? 3 months ago Chronic cough  ? [place], [place] P, DO  ?  ?  ?Future Appointments   ?        ? In 2 weeks Wynetta Emery, Barb Merino, DO [place]  ? In 2 months Wynetta Emery, Barb Merino, DO [facility]  ?  ? ?  ?  ?  ? ? ?

## 2021-07-26 NOTE — Telephone Encounter (Signed)
?  Chief Complaint: requesting PCP to contact pharmacy , symbicort not covered by insurance  ?Symptoms: difficulty breathing, shortness of breath with coughing spells. ?Frequency: today after OV  ?Pertinent Negatives: Patient denies chest pain  ?Disposition: '[]'$ ED /'[]'$ Urgent Care (no appt availability in office) / '[]'$ Appointment(In office/virtual)/ '[]'$  [place] [facility]/ '[]'$ Home Care/ '[]'$ Refused Recommended Disposition /'[]'$ [place] [facility]/ '[x]'$  Follow-up with PCP ?Additional Notes:  ? ?Please advise. Patient reports medication inhalers not covered and requesting alternative inhaler to symbicort. Please contact patient pharmacy . Requesting a call back. Patient aware to go to ED if difficulty breathing worsens  ? ? Reason for Disposition ? [1] MODERATE longstanding difficulty breathing (e.g., speaks in phrases, SOB even at rest, pulse 100-120) AND [2] SAME as normal ? ?Answer Assessment - Initial Assessment Questions ?1. RESPIRATORY STATUS: "Describe your breathing?" (e.g., wheezing, shortness of breath, unable to speak, severe coughing)  ?    Shortness of breath with coughing spells  ?2. ONSET: "When did this breathing problem begin?"  ?    na ?3. PATTERN "Does the difficult breathing come and go, or has it been constant since it started?"  ?    With coughing spells  ?4. SEVERITY: "How bad is your breathing?" (e.g., mild, moderate, severe)  ?  - MILD: No SOB at rest, mild SOB with walking, speaks normally in sentences, can lie down, no retractions, pulse < 100.  ?  - MODERATE: SOB at rest, SOB with minimal exertion and prefers to sit, cannot lie down flat, speaks in phrases, mild retractions, audible wheezing, pulse 100-120.  ?  - SEVERE: Very SOB at rest, speaks in single words, struggling to breathe, sitting hunched forward, retractions, pulse > 120  ?    na ?5. RECURRENT SYMPTOM: "Have you had difficulty breathing before?" If Yes, ask: "When was the last time?" and "What happened that time?"  ?    Yes seen  PCP  ?6. CARDIAC HISTORY: "Do you have any history of heart disease?" (e.g., heart attack, angina, bypass surgery, angioplasty)  ?    See hx  ?7. LUNG HISTORY: "Do you have any history of lung disease?"  (e.g., pulmonary embolus, asthma, emphysema) ?    See hx  ?8. CAUSE: "What do you think is causing the breathing problem?"  ?    na ?9. OTHER SYMPTOMS: "Do you have any other symptoms? (e.g., dizziness, runny nose, cough, chest pain, fever) ?    Coughing spells  ?10. O2 SATURATION MONITOR:  "Do you use an oxygen saturation monitor (pulse oximeter) at home?" If Yes, "What is your reading (oxygen level) today?" "What is your usual oxygen saturation reading?" (e.g., 95%) ?      na ?11. PREGNANCY: "Is there any chance you are pregnant?" "When was your last menstrual period?" ?      na ?12. TRAVEL: "Have you traveled out of the country in the last month?" (e.g., travel history, exposures) ?      na ? ?Protocols used: Breathing Difficulty-A-AH ? ?

## 2021-07-27 NOTE — Telephone Encounter (Signed)
Pharmacy requesting to change to [facility] due to symbicort not being covered.

## 2021-07-27 NOTE — Telephone Encounter (Signed)
New rx has been sent in.

## 2021-07-27 NOTE — Progress Notes (Signed)
Remote ICD transmission.   

## 2021-07-27 NOTE — Telephone Encounter (Signed)
Prescription change request has been sent to the provider in a refill encounter. Awaiting her response.

## 2021-08-03 ENCOUNTER — Telehealth: Payer: Self-pay | Admitting: Family Medicine

## 2021-08-03 NOTE — Telephone Encounter (Signed)
Copied from [place] [id number]. Topic: General - Other >> Aug 03, 2021 11:33 AM Yvette Rack wrote: Reason for CRM: Pt spouse reports that pt had appts with [facility] Med specialist this week. She stated pt saw the Pulmonologist on Tuesday and the Cardiologist yesterday and there was a change because Dr. Annice Needy advised that there is some rattling in the lower left lung. Pt declined to schedule an appt at this time but pt spouse asked that Dr.Johnson review the report and contact pt to advise. Cb# [phone]

## 2021-08-10 ENCOUNTER — Ambulatory Visit (INDEPENDENT_AMBULATORY_CARE_PROVIDER_SITE_OTHER): Payer: Medicare Other | Admitting: Family Medicine

## 2021-08-10 ENCOUNTER — Encounter: Payer: Self-pay | Admitting: Family Medicine

## 2021-08-10 DIAGNOSIS — J849 Interstitial pulmonary disease, unspecified: Secondary | ICD-10-CM

## 2021-08-10 DIAGNOSIS — I255 Ischemic cardiomyopathy: Secondary | ICD-10-CM

## 2021-08-10 NOTE — Progress Notes (Signed)
BP 114/73   Pulse 67   Temp 97.8 F (36.6 C)   Wt 183 lb 6.4 oz (83.2 kg)   SpO2 98%   BMI 31.24 kg/m    Subjective:    Patient ID: Samuel Morris, male    DOB: Jul 03, 1945, 76 y.o.   MRN: [id number]  HPI: Samuel Morris is a 76 y.o. male  Chief Complaint  Patient presents with   Cough    Patient states he is feeling a little better, still has a slight cough and coughing up mucus    COUGH Duration: months Circumstances of initial development of cough: URI Cough severity: mild Cough description: non-productive Aggravating factors:  nothing Alleviating factors: prednisone, breo Status:  better Treatments attempted: cold/sinus, mucinex, cough syrup, antibiotics, albuterol, and nasal CCS, prednisone, breo Wheezing: yes Shortness of breath: yes Chest pain: no Chest tightness:no Nasal congestion: no Runny nose: no Postnasal drip: no Frequent throat clearing or swallowing: no Hemoptysis: no Fevers: no Night sweats: no Weight loss: no Heartburn: no Recent foreign travel: no Tuberculosis contacts: no  Relevant past medical, surgical, family and social history reviewed and updated as indicated. Interim medical history since our last visit reviewed. Allergies and medications reviewed and updated.  Review of Systems  Constitutional: Negative.   HENT: Negative.    Respiratory:  Positive for cough and shortness of breath. Negative for apnea, choking, chest tightness, wheezing and stridor.   Cardiovascular: Negative.   Gastrointestinal: Negative.   Musculoskeletal: Negative.   Psychiatric/Behavioral: Negative.     Per HPI unless specifically indicated above     Objective:    BP 114/73   Pulse 67   Temp 97.8 F (36.6 C)   Wt 183 lb 6.4 oz (83.2 kg)   SpO2 98%   BMI 31.24 kg/m   Wt Readings from Last 3 Encounters:  08/10/21 183 lb 6.4 oz (83.2 kg)  07/25/21 184 lb (83.5 kg)  06/30/21 188 lb (85.3 kg)    Physical Exam Vitals and nursing note reviewed.   Constitutional:      General: He is not in acute distress.    Appearance: Normal appearance. He is obese. He is not ill-appearing, toxic-appearing or diaphoretic.  HENT:     Head: Normocephalic and atraumatic.     Right Ear: External ear normal.     Left Ear: External ear normal.     Nose: Nose normal.     Mouth/Throat:     Mouth: Mucous membranes are moist.     Pharynx: Oropharynx is clear.  Eyes:     General: No scleral icterus.       Right eye: No discharge.        Left eye: No discharge.     Extraocular Movements: Extraocular movements intact.     Conjunctiva/sclera: Conjunctivae normal.     Pupils: Pupils are equal, round, and reactive to light.  Cardiovascular:     Rate and Rhythm: Normal rate and regular rhythm.     Pulses: Normal pulses.     Heart sounds: Normal heart sounds. No murmur heard.   No friction rub. No gallop.  Pulmonary:     Effort: Pulmonary effort is normal. No respiratory distress.     Breath sounds: Normal breath sounds. No stridor. No wheezing, rhonchi or rales.  Chest:     Chest wall: No tenderness.  Musculoskeletal:        General: Normal range of motion.     Cervical back: Normal range of motion and neck supple.  Skin:    General: Skin is warm and dry.     Capillary Refill: Capillary refill takes less than 2 seconds.     Coloration: Skin is not jaundiced or pale.     Findings: No bruising, erythema, lesion or rash.  Neurological:     General: No focal deficit present.     Mental Status: He is alert and oriented to person, place, and time. Mental status is at baseline.  Psychiatric:        Mood and Affect: Mood normal.        Behavior: Behavior normal.        Thought Content: Thought content normal.        Judgment: Judgment normal.    Results for orders placed or performed in visit on 07/14/21  [facility] [place]  Result Value Ref Range   Date Time Interrogation Session [id number]    Pulse Generator Manufacturer MERM     Pulse Gen Model DDMB1D4 Evera MRI XT DR    Pulse Gen Serial Number [id number]    Clinic Name [facility] Pulse Generator Type Implantable Cardiac Defibulator    Implantable Pulse Generator Implant Date [id number]    Implantable Lead Manufacturer MERM    Implantable Lead Model 5076 CapSureFix Novus MRI SureScan    Implantable Lead Serial Number [id number]    Implantable Lead Implant Date 78938101    Implantable Lead Location Detail 1 UNKNOWN    Implantable Lead Location [id number]    Implantable Lead Manufacturer [facility]    Implantable Lead Model [id number] Sprint Quattro Secure S MRI SureScan    Implantable Lead Serial Number [id number]    Implantable Lead Implant Date 58527782    Implantable Lead Location Detail 1 UNKNOWN    Implantable Lead Location [id number]    Lead Channel Setting Sensing Sensitivity 0.3 mV   Lead Channel Setting Pacing Amplitude 1.5 V   Lead Channel Setting Pacing Pulse Width 0.4 ms   Lead Channel Setting Pacing Amplitude 2 V   Lead Channel Impedance Value 456 ohm   Lead Channel Sensing Intrinsic Amplitude 2 mV   Lead Channel Sensing Intrinsic Amplitude 2 mV   Lead Channel Pacing Threshold Amplitude 0.75 V   Lead Channel Pacing Threshold Pulse Width 0.4 ms   Lead Channel Impedance Value 437 ohm   Lead Channel Impedance Value 380 ohm   Lead Channel Sensing Intrinsic Amplitude 19.875 mV   Lead Channel Sensing Intrinsic Amplitude 19.875 mV   Lead Channel Pacing Threshold Amplitude 0.75 V   Lead Channel Pacing Threshold Pulse Width 0.4 ms   HighPow Impedance 69 ohm   Battery Status OK    Battery Remaining Longevity 84 mo   Battery Voltage 2.99 V   Brady Statistic RA Percent Paced 5.68 %   Brady Statistic RV Percent Paced 0.18 %   Brady Statistic AP VP Percent 0.15 %   Brady Statistic AS VP Percent 0.04 %   Brady Statistic AP VS Percent 5.65 %   Brady Statistic AS VS Percent 94.16 %      Assessment & Plan:   Problem List Items Addressed This Visit        Respiratory   Interstitial lung disease ([place])    Continue to follow with pulmonology. Has sample of breo. We do not have any samples today- he will contact pulmonology to see if they have another sample. Doing well on his medicine. Continue to monitor.  Follow up plan: Return if symptoms worsen or fail to improve.

## 2021-08-10 NOTE — Assessment & Plan Note (Signed)
Continue to follow with pulmonology. Has sample of breo. We do not have any samples today- he will contact pulmonology to see if they have another sample. Doing well on his medicine. Continue to monitor.

## 2021-08-15 ENCOUNTER — Ambulatory Visit (INDEPENDENT_AMBULATORY_CARE_PROVIDER_SITE_OTHER): Payer: Medicare Other | Admitting: Nurse Practitioner

## 2021-08-15 ENCOUNTER — Encounter: Payer: Self-pay | Admitting: Nurse Practitioner

## 2021-08-15 VITALS — BP 120/65 | HR 81 | Temp 98.4°F | Wt 181.4 lb

## 2021-08-15 DIAGNOSIS — S51011A Laceration without foreign body of right elbow, initial encounter: Secondary | ICD-10-CM | POA: Diagnosis not present

## 2021-08-15 DIAGNOSIS — I255 Ischemic cardiomyopathy: Secondary | ICD-10-CM

## 2021-08-15 NOTE — Progress Notes (Signed)
BP 120/65   Pulse 81   Temp 98.4 F (36.9 C) (Oral)   Wt 181 lb 6.4 oz (82.3 kg)   SpO2 95%   BMI 30.90 kg/m    Subjective:    Patient ID: Samuel Morris, male    DOB: 1945/11/03, 76 y.o.   MRN: [id number]  HPI: Samuel Morris is a 76 y.o. male  Chief Complaint  Patient presents with   Arm Problem    R arm wound x 1 week. Pt reports redness.    Patient states he was seen by Cardiology today who thought he needed the wounds on his arm looked at.  Patient is on Xerelto and ASA.  His skin tears easily.  Denies fevers.     Relevant past medical, surgical, family and social history reviewed and updated as indicated. Interim medical history since our last visit reviewed. Allergies and medications reviewed and updated.  Review of Systems  Skin:        Skin tear on right arm   Per HPI unless specifically indicated above     Objective:    BP 120/65   Pulse 81   Temp 98.4 F (36.9 C) (Oral)   Wt 181 lb 6.4 oz (82.3 kg)   SpO2 95%   BMI 30.90 kg/m   Wt Readings from Last 3 Encounters:  08/15/21 181 lb 6.4 oz (82.3 kg)  08/10/21 183 lb 6.4 oz (83.2 kg)  07/25/21 184 lb (83.5 kg)    Physical Exam Vitals and nursing note reviewed.  Constitutional:      General: He is not in acute distress.    Appearance: Normal appearance. He is not ill-appearing, toxic-appearing or diaphoretic.  HENT:     Head: Normocephalic.     Right Ear: External ear normal.     Left Ear: External ear normal.     Nose: Nose normal. No congestion or rhinorrhea.     Mouth/Throat:     Mouth: Mucous membranes are moist.  Eyes:     General:        Right eye: No discharge.        Left eye: No discharge.     Extraocular Movements: Extraocular movements intact.     Conjunctiva/sclera: Conjunctivae normal.     Pupils: Pupils are equal, round, and reactive to light.  Cardiovascular:     Rate and Rhythm: Normal rate and regular rhythm.     Heart sounds: No murmur heard. Pulmonary:     Effort: Pulmonary  effort is normal. No respiratory distress.     Breath sounds: Normal breath sounds. No wheezing, rhonchi or rales.  Abdominal:     General: Abdomen is flat. Bowel sounds are normal.  Musculoskeletal:     Cervical back: Normal range of motion and neck supple.  Skin:    General: Skin is warm and dry.     Capillary Refill: Capillary refill takes less than 2 seconds.       Neurological:     General: No focal deficit present.     Mental Status: He is alert and oriented to person, place, and time.  Psychiatric:        Mood and Affect: Mood normal.        Behavior: Behavior normal.        Thought Content: Thought content normal.        Judgment: Judgment normal.    Results for orders placed or performed in visit on 07/14/21  [place]  Result Value Ref Range   Date Time Interrogation Session [id number]    Pulse Generator Manufacturer MERM    Pulse Gen Model DDMB1D4 Evera MRI XT DR    Pulse Gen Serial Number [id number]    Clinic Name [place] Pulse Generator Type Implantable Cardiac Defibulator    Implantable Pulse Generator Implant Date [id number]    Implantable Lead Manufacturer MERM    Implantable Lead Model 5076 CapSureFix Novus MRI SureScan    Implantable Lead Serial Number [id number]    Implantable Lead Implant Date 57903833    Implantable Lead Location Detail 1 UNKNOWN    Implantable Lead Location [id number]    Implantable Lead Manufacturer [facility]    Implantable Lead Model [id number] Sprint Quattro Secure S MRI SureScan    Implantable Lead Serial Number [id number]    Implantable Lead Implant Date 19166060    Implantable Lead Location Detail 1 UNKNOWN    Implantable Lead Location [id number]    Lead Channel Setting Sensing Sensitivity 0.3 mV   Lead Channel Setting Pacing Amplitude 1.5 V   Lead Channel Setting Pacing Pulse Width 0.4 ms   Lead Channel Setting Pacing Amplitude 2 V   Lead Channel Impedance Value 456 ohm   Lead Channel Sensing  Intrinsic Amplitude 2 mV   Lead Channel Sensing Intrinsic Amplitude 2 mV   Lead Channel Pacing Threshold Amplitude 0.75 V   Lead Channel Pacing Threshold Pulse Width 0.4 ms   Lead Channel Impedance Value 437 ohm   Lead Channel Impedance Value 380 ohm   Lead Channel Sensing Intrinsic Amplitude 19.875 mV   Lead Channel Sensing Intrinsic Amplitude 19.875 mV   Lead Channel Pacing Threshold Amplitude 0.75 V   Lead Channel Pacing Threshold Pulse Width 0.4 ms   HighPow Impedance 69 ohm   Battery Status OK    Battery Remaining Longevity 84 mo   Battery Voltage 2.99 V   Brady Statistic RA Percent Paced 5.68 %   Brady Statistic RV Percent Paced 0.18 %   Brady Statistic AP VP Percent 0.15 %   Brady Statistic AS VP Percent 0.04 %   Brady Statistic AP VS Percent 5.65 %   Brady Statistic AS VS Percent 94.16 %      Assessment & Plan:   Problem List Items Addressed This Visit   None Visit Diagnoses     Skin tear of right elbow without complication, initial encounter    -  Primary   No signs of infection. Recommend leaving it open to air to help healing process.  Discussed s/s of infection.  Follow up if symptoms present.        Follow up plan: Return if symptoms worsen or fail to improve.

## 2021-08-16 ENCOUNTER — Encounter: Payer: Self-pay | Admitting: Nurse Practitioner

## 2021-09-06 ENCOUNTER — Other Ambulatory Visit: Payer: Self-pay | Admitting: Family Medicine

## 2021-09-06 NOTE — Telephone Encounter (Signed)
Requested Prescriptions  Pending Prescriptions Disp Refills  . ezetimibe (ZETIA) 10 MG tablet [Pharmacy Med Name: EZETIMIBE 10 MG TABLET] 90 tablet 1    Sig: TAKE 1 TABLET BY MOUTH EVERY DAY     Cardiovascular:  Antilipid - Sterol Transport Inhibitors Failed - 09/06/2021  1:49 AM      Failed - Lipid Panel in normal range within the last 12 months    Cholesterol, Total  Date Value Ref Range Status  04/13/2021 134 100 - 199 mg/dL Final   LDL Chol Calc (NIH)  Date Value Ref Range Status  04/13/2021 74 0 - 99 mg/dL Final   HDL  Date Value Ref Range Status  04/13/2021 43 >39 mg/dL Final   Triglycerides  Date Value Ref Range Status  04/13/2021 87 0 - 149 mg/dL Final         Passed - AST in normal range and within 360 days    AST  Date Value Ref Range Status  04/13/2021 18 0 - 40 IU/L Final         Passed - ALT in normal range and within 360 days    ALT  Date Value Ref Range Status  04/13/2021 27 0 - 44 IU/L Final         Passed - Patient is not pregnant      Passed - Valid encounter within last 12 months    Recent Outpatient Visits          3 weeks ago Skin tear of right elbow without complication, initial encounter   [facility] Jon Billings, NP   3 weeks ago Interstitial lung disease ([place])   [employer], Megan P, DO   1 month ago Subacute cough   [place], Megan P, DO   1 month ago Upper respiratory tract infection, unspecified type   [facility] Vigg, Avanti, MD   2 months ago Acute non-recurrent maxillary sinusitis   [facility] [place], [place], DO      Future Appointments            In 1 month Johnson, Barb Merino, DO [employer]

## 2021-09-18 ENCOUNTER — Ambulatory Visit: Payer: Medicare Other

## 2021-09-18 NOTE — Patient Instructions (Signed)
Visit Information   Goals Addressed   None    Patient Care Plan: ccm pharmacy care plan     Problem Identified: T2DM, CHF, CAD s/p MI; OSA, HLD   Priority: High  Onset Date: 03/29/2021     Long-Range Goal: disease management   Start Date: 03/29/2021  Recent Progress: On track  Priority: High  Note:   Current Barriers:  Unable to independently afford treatment regimen  Pharmacist Clinical Goal(s):  Patient will verbalize ability to afford treatment regimen through collaboration with PharmD and provider.   Interventions: 1:1 collaboration with Valerie Roys, DO regarding development and update of comprehensive plan of care as evidenced by provider attestation and co-signature Inter-disciplinary care team collaboration (see longitudinal plan of care) Comprehensive medication review performed; medication list updated in electronic medical record  Hypertension (BP goal <130/80) BP Readings from Last 3 Encounters:  08/15/21 120/65  08/10/21 114/73  07/25/21 124/72  -Controlled -Current treatment: Imdur - Appropriate, Effective, Safe, Accessible Metoprolol succinate  - Appropriate, Effective, Safe, Accessible Spironolactone  - Appropriate, Effective, Safe, Accessible Furosemide  - Appropriate, Effective, Safe, Accessible Entresto - Appropriate, Effective, Safe, query-Accessible -Denies hypotensive/hypertensive symptoms -Educated on BP goals and benefits of medications for prevention of heart attack, stroke and kidney damage; Exercise goal of 150 minutes per week; Importance of home blood pressure monitoring; -Counseled to monitor BP at home 1-2x/wk, document, and provide log at future appointments -Counseled on diet and exercise extensively July 2023: Entresto PAP  Hyperlipidemia: (LDL goal < 70) The ASCVD Risk score (Arnett DK, et al., 2019) failed to calculate for the following reasons:   The patient has a prior MI or stroke diagnosis Lab Results  Component Value  Date   CHOL 134 04/13/2021   CHOL 130 10/11/2020   CHOL 131 04/11/2020   Lab Results  Component Value Date   HDL 43 04/13/2021   HDL 54 10/11/2020   HDL 37 (L) 04/11/2020   Lab Results  Component Value Date   LDLCALC 74 04/13/2021   LDLCALC 58 10/11/2020   LDLCALC 76 04/11/2020   Lab Results  Component Value Date   TRIG 87 04/13/2021   TRIG 98 10/11/2020   TRIG 92 04/11/2020  No results found for: "CHOLHDL" No results found for: "LDLDIRECT" -Controlled -Current treatment: Crestor 20 mg once daily  - Appropriate, Effective, Safe, Accessible Zetia 10 mg once daily  - Appropriate, Effective, Safe, Accessible -Side effect review - tolerating well, no problems -Educated on Cholesterol goals;  Benefits of statin for ASCVD risk reduction; -Recommended to continue current medication  Diabetes (A1c goal <7%) Lab Results  Component Value Date   HGBA1C 6.4 (H) 04/13/2021   HGBA1C 6.8 (H) 01/12/2021   HGBA1C 6.9 10/11/2020   Lab Results  Component Value Date   MICROALBUR 10 09/10/2019   LDLCALC 74 04/13/2021   CREATININE 0.90 04/13/2021   Lab Results  Component Value Date   NA 137 04/13/2021   K 4.8 04/13/2021   CREATININE 0.90 04/13/2021   EGFR 89 04/13/2021   GFRNONAA >60 01/12/2021   GLUCOSE 148 (H) 04/13/2021   Lab Results  Component Value Date   WBC 8.6 04/13/2021   HGB 14.7 04/13/2021   HCT 45.0 04/13/2021   MCV 88 04/13/2021   PLT 283 04/13/2021  -Controlled -Current medications: Farxiga 5 mg (PAP Approved) Appropriate, Effective, Safe, Accessible -Medications previously tried: N/A -Current home glucose readings fasting glucose: 100-120s post prandial glucose: n/a -Denies hypoglycemic/hyperglycemic symptoms -Recommended to continue current medication  COPD (Goal: control symptoms and prevent exacerbations) -Uncontrolled -Current treatment  [name] (Not taking, can't afford) Query Appropriate, Query effective, Query Safe, Query  accessible -Medications previously tried: N/A  -MMRC/CAT score:      No data to display         -Pulmonary function testing:  PF Readings from Last 3 Encounters:  No data found for PF   -Exacerbations requiring treatment in last 6 months: N/A -Patient denies consistent use of maintenance inhaler -Frequency of rescue inhaler use: doesn't use -Counseled on Proper inhaler technique; July 2023: Can't afford [name], start PAP   Patient Goals/Self-Care Activities Patient will:  - take medications as prescribed as evidenced by patient report and record review  CPP F/U Sept 2023  [place] Constable, Pharm.D. - [phone]      Samuel Morris was given information about Chronic Care Management services today including:  CCM service includes personalized support from designated clinical staff supervised by his physician, including individualized plan of care and coordination with other care providers 24/7 contact phone numbers for assistance for urgent and routine care needs. Standard insurance, coinsurance, copays and deductibles apply for chronic care management only during months in which we provide at least 20 minutes of these services. Most insurances cover these services at 100%, however patients may be responsible for any copay, coinsurance and/or deductible if applicable. This service may help you avoid the need for more expensive face-to-face services. Only one practitioner may furnish and bill the service in a calendar month. The patient may stop CCM services at any time (effective at the end of the month) by phone call to the office staff.  Patient agreed to services and verbal consent obtained.   The patient verbalized understanding of instructions, educational materials, and care plan provided today and DECLINED offer to receive copy of patient instructions, educational materials, and care plan.  The pharmacy team will reach out to the patient again over the next 60 days.   Samuel Morris, [place]

## 2021-09-18 NOTE — Progress Notes (Signed)
.ccm Chronic Care Management Pharmacy Note  09/18/2021 Name:  Samuel Morris MRN:  [id number] DOB:  11/20/1945  Summary: Pleasant 76 year old male presents for f/u CCM visit. 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 His wife is from [place]. They also lived in [place] for 35 years.  Recommendations to Provider: Patient asked me to ask PCP if she could send in an emergency "Prednisone Pack" (Similar to what his daughter has for asthma) so if he has a flareup he can take. Told patient I would ask PCP Entresto and [name] PAP, will mail out ASAP   Subjective: Samuel Morris is an 76 y.o. year old male who is a primary patient of Valerie Roys, DO.  The CCM team was consulted for assistance with disease management and care coordination needs.    Engaged with patient by telephone for follow up visit in response to provider referral for pharmacy case management and/or care coordination services.   Consent to Services:  The patient was given information about Chronic Care Management services, agreed to services, and gave verbal consent prior to initiation of services.  Please see initial visit note for detailed documentation.   Patient Care Team: Valerie Roys, DO as PCP - General (Family Medicine) Vickie Epley, MD as PCP - Electrophysiology (Cardiology) End, Harrell Gave, MD as PCP - Cardiology (Cardiology) Lane Hacker, [facility] (Pharmacist)  Objective:  Lab Results  Component Value Date   CREATININE 0.90 04/13/2021   CREATININE 1.13 01/17/2021   CREATININE 0.92 01/12/2021    Lab Results  Component Value Date   HGBA1C 6.4 (H) 04/13/2021   HGBA1C 6.8 (H) 01/12/2021   HGBA1C 6.9 10/11/2020   Last diabetic Eye exam:  Lab Results  Component Value Date/Time   HMDIABEYEEXA No Retinopathy 05/19/2020 12:00 AM    Last diabetic Foot exam: No results found for: "HMDIABFOOTEX"      Component Value Date/Time   CHOL 134 04/13/2021  0829   CHOL 130 10/11/2020 0833   CHOL 131 04/11/2020 1006   TRIG 87 04/13/2021 0829   TRIG 98 10/11/2020 0833   TRIG 92 04/11/2020 1006   HDL 43 04/13/2021 0829   HDL 54 10/11/2020 0833   HDL 37 (L) 04/11/2020 1006   LDLCALC 74 04/13/2021 0829   LDLCALC 58 10/11/2020 0833   LDLCALC 76 04/11/2020 1006       Latest Ref Rng & Units 04/13/2021    8:29 AM 01/17/2021   12:17 PM 10/11/2020    8:33 AM  Hepatic Function  Total Protein 6.0 - 8.5 g/dL 7.1  7.0  5.8   Albumin 3.7 - 4.7 g/dL 4.5  4.4  4.2   AST 0 - 40 IU/L _0 ALT 0 - 44 IU/L _1 Alk Phosphatase 44 - 121 IU/L 103  101  75   Total Bilirubin 0.0 - 1.2 mg/dL 0.5  0.5  0.4     Lab Results  Component Value Date/Time   TSH 3.240 11/06/2018 09:19 AM       Latest Ref Rng & Units 04/13/2021    8:29 AM 01/17/2021   12:17 PM 01/10/2021    8:41 AM  CBC  WBC 3.4 - 10.8 x10E3/uL 8.6  13.6  10.2   Hemoglobin 13.0 - 17.7 g/dL 14.7  14.6  14.5   Hematocrit 37.5 - 51.0 % 45.0  44.6  44.5   Platelets 150 - 450  x10E3/uL 283  284  249     No results found for: "VD25OH"  Clinical ASCVD:  The ASCVD Risk score (Arnett DK, et al., 2019) failed to calculate for the following reasons:   The patient has a prior MI or stroke diagnosis   Social History   Tobacco Use  Smoking Status Never  Smokeless Tobacco Never   BP Readings from Last 3 Encounters:  08/15/21 120/65  08/10/21 114/73  07/25/21 124/72   Pulse Readings from Last 3 Encounters:  08/15/21 81  08/10/21 67  07/25/21 71   Wt Readings from Last 3 Encounters:  08/15/21 181 lb 6.4 oz (82.3 kg)  08/10/21 183 lb 6.4 oz (83.2 kg)  07/25/21 184 lb (83.5 kg)   BMI Readings from Last 3 Encounters:  08/15/21 30.90 kg/m  08/10/21 31.24 kg/m  07/25/21 31.34 kg/m    Assessment: Review of patient past medical history, allergies, medications, health status, including review of consultants reports, laboratory and other test data, was performed as part of  comprehensive evaluation and provision of chronic care management services.   SDOH:  (Social Determinants of Health) assessments and interventions performed:  SDOH Interventions    Flowsheet Row Most Recent Value  SDOH Interventions   Financial Strain Interventions Other (Comment)  [PAP]       CCM Care Plan  No Known Allergies  Medications Reviewed Today     Reviewed by Lane Hacker, [facility] (Pharmacist) on 09/18/21 at [place] List Status: <None>   Medication Order Taking? Sig Documenting Provider Last Dose Status Informant  albuterol (VENTOLIN HFA) 108 (90 Base) MCG/ACT inhaler [id number] No Inhale 1 puff into the lungs every 4 (four) hours as needed for wheezing or shortness of breath. Park Liter P, DO Taking Active   Ascorbic Acid (VITAMIN C PO) [id number] No Take 100 mg by mouth daily. [provider] Taking Active Self  aspirin EC 81 MG tablet [id number] No Take 81 mg by mouth daily. [provider] Taking Active Self  benzonatate (TESSALON) 200 MG capsule [id number] No Take 1 capsule (200 mg total) by mouth 2 (two) times daily as needed for cough. Charlynne Cousins, MD Taking Active   Calcium-Magnesium 100-50 MG TABS [id number] No Take 1 tablet by mouth daily. [provider] Taking Active Self  Cholecalciferol (VITAMIN D-1000 MAX ST) 25 MCG (1000 UT) tablet [id number] No Take 1,000 Units by mouth daily. [provider] Taking Active Self  dapagliflozin propanediol (FARXIGA) 5 MG TABS tablet [id number] No Take 1 tablet (5 mg total) by mouth daily. Park Liter P, DO Taking Active   diclofenac Sodium (VOLTAREN) 1 % GEL [id number] No  [provider] Taking Active Self  ENTRESTO 49-51 MG [id number] No Take 1 tablet by mouth 2 (two) times daily. [provider] Taking Active Self           Med Note De Hollings[place]   Fri Dec 26, 2018  2:29 PM)    ezetimibe (ZETIA) 10 MG tablet [id number]  TAKE 1 TABLET BY MOUTH EVERY DAY Johnson,  Megan P, DO  Active   fluticasone (FLONASE) 50 MCG/ACT nasal spray [id number] No Place 1 spray into both nostrils daily. [provider] Taking Active   fluticasone furoate-vilanterol ([name] ELLIPTA) 100-25 MCG/ACT AEPB [id number] No Inhale 1 puff into the lungs daily. Park Liter P, DO Taking Active   folic acid (FOLVITE) 1 MG tablet [id number] No 1 tablet [provider] Taking Active   furosemide (LASIX) 40 MG tablet  [id number] No Take 1 tablet (40 mg total) by mouth daily. Shawna Clamp, MD Taking Active   gabapentin (NEURONTIN) 100 MG capsule [id number] No Take 100 mg by mouth 3 (three) times daily. [provider] Taking Active   hydroxychloroquine (PLAQUENIL) 200 MG tablet [id number] No Take by mouth. [provider] Taking Active   isosorbide mononitrate (IMDUR) 30 MG 24 hr tablet [id number] No TAKE 1/2 OF A TABLET (15 MG TOTAL) BY MOUTH DAILY Johnson, Megan P, DO Taking Active   methotrexate (RHEUMATREX) 2.5 MG tablet [id number] No Take 15 mg by mouth once a week. [provider] Taking Active   metoprolol succinate (TOPROL-XL) 100 MG 24 hr tablet [id number] No Take 100 mg by mouth daily. Take with or immediately following a meal. [provider] Taking Active   mexiletine (MEXITIL) 150 MG capsule [id number] No Take 150 mg by mouth 2 (two) times daily. [provider] Taking Active   nitroGLYCERIN (NITROSTAT) 0.4 MG SL tablet [id number] No Place 1 tablet (0.4 mg total) under the tongue every 5 (five) minutes as needed for chest pain. Minna Merritts, MD Taking Active   rivaroxaban (XARELTO) 20 MG TABS tablet [id number] No Take by mouth. [provider] Taking Active   rosuvastatin (CRESTOR) 20 MG tablet [id number] No TAKE 1 TABLET BY MOUTH AT BEDTIME Johnson, Megan P, DO Taking Active   spironolactone (ALDACTONE) 25 MG tablet [id number] No Take 1 tablet (25 mg total) by mouth daily. Park Liter P, DO Taking Active   Turmeric  (QC TUMERIC COMPLEX PO) [id number] No Take by mouth daily. Unsure of dose [provider] Taking Active Self  vitamin B-12 (CYANOCOBALAMIN) 1000 MCG tablet [id number] No Take by mouth daily. [provider] Taking Active Self            Patient Active Problem List   Diagnosis Date Noted   NSTEMI (non-ST elevated myocardial infarction) ([place]) 01/10/2021   Senile purpura ([place]) 10/11/2020   Rheumatoid arthritis ([place]) 07/09/2020   Interstitial lung disease ([place]) 07/09/2020   CAD (coronary artery disease) 11/05/2018   Hyperlipidemia associated with type 2 diabetes mellitus ([place]) 11/05/2018   Type 2 diabetes mellitus with cardiac complication ([place]) 15/72/6203   OSA (obstructive sleep apnea) 11/05/2018   HTN (hypertension) 11/05/2018   Aortic stenosis 11/05/2018   Presence of automatic (implantable) cardiac defibrillator 11/05/2018   Hx of melanoma of skin 11/05/2018   Angina pectoris ([place]) 11/05/2018   Pulmonary nodules 11/05/2018   Dilated cardiomyopathy ([place]) 11/05/2018   Adhesive capsulitis of both shoulders 03/16/2018   AVN (avascular necrosis of bone) ([place]) 03/16/2018   Chronic pain of both shoulders 03/16/2018   Kyphoscoliosis deformity of spine 03/16/2018   Bilateral cataracts 01/20/2018   Overweight 12/18/2017   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 11/12/2017   DJD of right shoulder 04/24/2017   Right knee DJD 04/24/2017   S/P AVR (aortic valve replacement) 12/17/2016   VT (ventricular tachycardia) ([place]) 07/27/2016   Ischemic cardiomyopathy 07/11/2016   Murmur 07/11/2016   H/O acute myocardial infarction 07/09/2016    Immunization History  Administered Date(s) Administered   Fluad Quad(high Dose 65+) 11/06/2018, 01/04/2021   Influenza, High Dose Seasonal PF 12/11/2016, 03/14/2018   PFIZER(Purple Top)SARS-COV-2 Vaccination 05/04/2019, 05/29/2019   Pneumococcal Conjugate-13 12/14/2013   Pneumococcal Polysaccharide-23 12/01/2014    Td 10/11/2020    Conditions to be addressed/monitored: T2DM, CHF, CAD s/p MI; OSA, HLD   Care Plan : ccm pharmacy care plan  Updates made by [place] Constable  K, RPH since 09/18/2021 12:00 AM     Problem: T2DM, CHF, CAD s/p MI; OSA, HLD   Priority: High  Onset Date: 03/29/2021     Long-Range Goal: disease management   Start Date: 03/29/2021  Recent Progress: On track  Priority: High  Note:   Current Barriers:  Unable to independently afford treatment regimen  Pharmacist Clinical Goal(s):  Patient will verbalize ability to afford treatment regimen through collaboration with PharmD and provider.   Interventions: 1:1 collaboration with Valerie Roys, DO regarding development and update of comprehensive plan of care as evidenced by provider attestation and co-signature Inter-disciplinary care team collaboration (see longitudinal plan of care) Comprehensive medication review performed; medication list updated in electronic medical record  Hypertension (BP goal <130/80) BP Readings from Last 3 Encounters:  08/15/21 120/65  08/10/21 114/73  07/25/21 124/72  -Controlled -Current treatment: Imdur - Appropriate, Effective, Safe, Accessible Metoprolol succinate  - Appropriate, Effective, Safe, Accessible Spironolactone  - Appropriate, Effective, Safe, Accessible Furosemide  - Appropriate, Effective, Safe, Accessible Entresto - Appropriate, Effective, Safe, query-Accessible -Denies hypotensive/hypertensive symptoms -Educated on BP goals and benefits of medications for prevention of heart attack, stroke and kidney damage; Exercise goal of 150 minutes per week; Importance of home blood pressure monitoring; -Counseled to monitor BP at home 1-2x/wk, document, and provide log at future appointments -Counseled on diet and exercise extensively July 2023: Entresto PAP  Hyperlipidemia: (LDL goal < 70) The ASCVD Risk score (Arnett DK, et al., 2019) failed to calculate for the following  reasons:   The patient has a prior MI or stroke diagnosis Lab Results  Component Value Date   CHOL 134 04/13/2021   CHOL 130 10/11/2020   CHOL 131 04/11/2020   Lab Results  Component Value Date   HDL 43 04/13/2021   HDL 54 10/11/2020   HDL 37 (L) 04/11/2020   Lab Results  Component Value Date   LDLCALC 74 04/13/2021   LDLCALC 58 10/11/2020   LDLCALC 76 04/11/2020   Lab Results  Component Value Date   TRIG 87 04/13/2021   TRIG 98 10/11/2020   TRIG 92 04/11/2020  No results found for: "CHOLHDL" No results found for: "LDLDIRECT" -Controlled -Current treatment: Crestor 20 mg once daily  - Appropriate, Effective, Safe, Accessible Zetia 10 mg once daily  - Appropriate, Effective, Safe, Accessible -Side effect review - tolerating well, no problems -Educated on Cholesterol goals;  Benefits of statin for ASCVD risk reduction; -Recommended to continue current medication  Diabetes (A1c goal <7%) Lab Results  Component Value Date   HGBA1C 6.4 (H) 04/13/2021   HGBA1C 6.8 (H) 01/12/2021   HGBA1C 6.9 10/11/2020   Lab Results  Component Value Date   MICROALBUR 10 09/10/2019   LDLCALC 74 04/13/2021   CREATININE 0.90 04/13/2021   Lab Results  Component Value Date   NA 137 04/13/2021   K 4.8 04/13/2021   CREATININE 0.90 04/13/2021   EGFR 89 04/13/2021   GFRNONAA >60 01/12/2021   GLUCOSE 148 (H) 04/13/2021   Lab Results  Component Value Date   WBC 8.6 04/13/2021   HGB 14.7 04/13/2021   HCT 45.0 04/13/2021   MCV 88 04/13/2021   PLT 283 04/13/2021  -Controlled -Current medications: Farxiga 5 mg (PAP Approved) Appropriate, Effective, Safe, Accessible -Medications previously tried: N/A -Current home glucose readings fasting glucose: 100-120s post prandial glucose: n/a -Denies hypoglycemic/hyperglycemic symptoms -Recommended to continue current medication  COPD (Goal: control symptoms and prevent exacerbations) -Uncontrolled -Current treatment  [name] (Not taking,  can't afford) Query Appropriate, Query effective, Query Safe, Query accessible -Medications previously tried: N/A  -MMRC/CAT score:      No data to display         -Pulmonary function testing:  PF Readings from Last 3 Encounters:  No data found for PF   -Exacerbations requiring treatment in last 6 months: N/A -Patient denies consistent use of maintenance inhaler -Frequency of rescue inhaler use: doesn't use -Counseled on Proper inhaler technique; July 2023: Can't afford [name], start PAP   Patient Goals/Self-Care Activities Patient will:  - take medications as prescribed as evidenced by patient report and record review  CPP F/U Sept 2023  [place] Constable, Pharm.D. - [phone]      Patient's preferred pharmacy is:  [id number] [place] [address] [place]: [phone]: [address]Phone: [phone]: [phone]  Pt endorses 100% compliance  Follow Up:  Patient agrees to Care Plan and Follow-up. Plan: CPP F/U Sept 2023  NArizona Constable PFloridaD. -- [phone]

## 2021-09-26 ENCOUNTER — Ambulatory Visit: Payer: Medicare Other | Admitting: Podiatry

## 2021-09-29 ENCOUNTER — Other Ambulatory Visit: Payer: Self-pay | Admitting: Family Medicine

## 2021-09-29 NOTE — Telephone Encounter (Signed)
Requested medication (s) are due for refill today: yes  Requested medication (s) are on the active medication list: yes  Last refill:  01/19/21   Future visit scheduled: yes  Notes to clinic:  historical med, please advise     Requested Prescriptions  Pending Prescriptions Disp Refills   metoprolol succinate (TOPROL-XL) 100 MG 24 hr tablet [Pharmacy Med Name: METOPROLOL SUCC ER 100 MG TAB] 135 tablet 1    Sig: TAKE 1 AND 1/2 TABLETS BY MOUTH DAILY WITH OR IMMEDIATELY FOLLOWING A MEAL     Cardiovascular:  Beta Blockers Passed - 09/29/2021  2:04 AM      Passed - Last BP in normal range    BP Readings from Last 1 Encounters:  08/15/21 120/65         Passed - Last Heart Rate in normal range    Pulse Readings from Last 1 Encounters:  08/15/21 81         Passed - Valid encounter within last 6 months    Recent Outpatient Visits           1 month ago Skin tear of right elbow without complication, initial encounter   [facility] Jon Billings, NP   1 month ago Interstitial lung disease ([place])   [place], Megan P, DO   2 months ago Subacute cough   [facility] [place], Megan P, DO   2 months ago Upper respiratory tract infection, unspecified type   [facility] Vigg, Avanti, MD   3 months ago Acute non-recurrent maxillary sinusitis   [facility] [place], [place], DO       Future Appointments             In 1 week Wynetta Emery, Barb Merino, DO [employer], [facility]

## 2021-09-29 NOTE — Telephone Encounter (Signed)
We have not been writing that. I think his cardiologist was. Would he like [place] to take it over?

## 2021-10-04 ENCOUNTER — Telehealth: Payer: Self-pay

## 2021-10-04 NOTE — Chronic Care Management (AMB) (Signed)
    Chronic Care Management Pharmacy Assistant   Name: Samuel Morris  MRN: [id number] DOB: Apr 23, 1945   Reason for Encounter: Patient assistance application [name] and [name]  Medications: Outpatient Encounter Medications as of 10/04/2021  Medication Sig   albuterol (VENTOLIN HFA) 108 (90 Base) MCG/ACT inhaler Inhale 1 puff into the lungs every 4 (four) hours as needed for wheezing or shortness of breath.   Ascorbic Acid (VITAMIN C PO) Take 100 mg by mouth daily.   aspirin EC 81 MG tablet Take 81 mg by mouth daily.   benzonatate (TESSALON) 200 MG capsule Take 1 capsule (200 mg total) by mouth 2 (two) times daily as needed for cough.   Calcium-Magnesium 100-50 MG TABS Take 1 tablet by mouth daily.   Cholecalciferol (VITAMIN D-1000 MAX ST) 25 MCG (1000 UT) tablet Take 1,000 Units by mouth daily.   dapagliflozin propanediol (FARXIGA) 5 MG TABS tablet Take 1 tablet (5 mg total) by mouth daily.   diclofenac Sodium (VOLTAREN) 1 % GEL    ENTRESTO 49-51 MG Take 1 tablet by mouth 2 (two) times daily.   ezetimibe (ZETIA) 10 MG tablet TAKE 1 TABLET BY MOUTH EVERY DAY   fluticasone (FLONASE) 50 MCG/ACT nasal spray Place 1 spray into both nostrils daily.   fluticasone furoate-vilanterol ([name] ELLIPTA) 100-25 MCG/ACT AEPB Inhale 1 puff into the lungs daily.   folic acid (FOLVITE) 1 MG tablet 1 tablet   furosemide (LASIX) 40 MG tablet Take 1 tablet (40 mg total) by mouth daily.   gabapentin (NEURONTIN) 100 MG capsule Take 100 mg by mouth 3 (three) times daily.   hydroxychloroquine (PLAQUENIL) 200 MG tablet Take by mouth.   isosorbide mononitrate (IMDUR) 30 MG 24 hr tablet TAKE 1/2 OF A TABLET (15 MG TOTAL) BY MOUTH DAILY   methotrexate (RHEUMATREX) 2.5 MG tablet Take 15 mg by mouth once a week.   metoprolol succinate (TOPROL-XL) 100 MG 24 hr tablet Take 100 mg by mouth daily. Take with or immediately following a meal.   mexiletine (MEXITIL) 150 MG capsule Take 150 mg by mouth 2 (two) times daily.    nitroGLYCERIN (NITROSTAT) 0.4 MG SL tablet Place 1 tablet (0.4 mg total) under the tongue every 5 (five) minutes as needed for chest pain.   rivaroxaban (XARELTO) 20 MG TABS tablet Take by mouth.   rosuvastatin (CRESTOR) 20 MG tablet TAKE 1 TABLET BY MOUTH AT BEDTIME   spironolactone (ALDACTONE) 25 MG tablet Take 1 tablet (25 mg total) by mouth daily.   Turmeric (QC TUMERIC COMPLEX PO) Take by mouth daily. Unsure of dose   vitamin B-12 (CYANOCOBALAMIN) 1000 MCG tablet Take by mouth daily.   No facility-administered encounter medications on file as of 10/04/2021.    I have prefilled and mailed out the patient assistance applications to the patient for [name] and [name]. I have explained to the patient about where he needs to complete the form and to bring them back to office once he completes them. Patient understood and also explained that his insurance isn't covered with [facility] and scheduled an appointment with the clinical pharmacist in October due to the books being full.   Corrie Mckusick, [place]

## 2021-10-10 ENCOUNTER — Ambulatory Visit: Payer: Medicare Other | Admitting: Podiatry

## 2021-10-11 ENCOUNTER — Ambulatory Visit: Payer: Medicare Other | Admitting: Family Medicine

## 2021-10-13 ENCOUNTER — Ambulatory Visit (INDEPENDENT_AMBULATORY_CARE_PROVIDER_SITE_OTHER): Payer: Medicare Other

## 2021-10-13 DIAGNOSIS — I255 Ischemic cardiomyopathy: Secondary | ICD-10-CM

## 2021-10-13 LAB — CUP PACEART REMOTE DEVICE CHECK
Battery Remaining Longevity: 78 mo
Battery Voltage: 2.99 V
Brady Statistic AP VP Percent: 0.07 %
Brady Statistic AP VS Percent: 8.28 %
Brady Statistic AS VP Percent: 0.05 %
Brady Statistic AS VS Percent: 91.61 %
Brady Statistic RA Percent Paced: 8.05 %
Brady Statistic RV Percent Paced: 0.1 %
Date Time Interrogation Session: 20230804031804
HighPow Impedance: 65 Ohm
Implantable Lead Implant Date: 20121205
Implantable Lead Implant Date: 20121205
Implantable Lead Location: 753859
Implantable Lead Location: 753860
Implantable Lead Model: 5076
Implantable Pulse Generator Implant Date: 20190702
Lead Channel Impedance Value: 380 Ohm
Lead Channel Impedance Value: 437 Ohm
Lead Channel Impedance Value: 456 Ohm
Lead Channel Pacing Threshold Amplitude: 0.875 V
Lead Channel Pacing Threshold Amplitude: 0.875 V
Lead Channel Pacing Threshold Pulse Width: 0.4 ms
Lead Channel Pacing Threshold Pulse Width: 0.4 ms
Lead Channel Sensing Intrinsic Amplitude: 1.75 mV
Lead Channel Sensing Intrinsic Amplitude: 1.75 mV
Lead Channel Sensing Intrinsic Amplitude: 18.375 mV
Lead Channel Sensing Intrinsic Amplitude: 18.375 mV
Lead Channel Setting Pacing Amplitude: 1.5 V
Lead Channel Setting Pacing Amplitude: 1.5 V
Lead Channel Setting Pacing Pulse Width: 0.4 ms
Lead Channel Setting Sensing Sensitivity: 0.3 mV

## 2021-10-20 NOTE — Progress Notes (Deleted)
Name: Samuel Morris  MRN/ DOB: [id number], Jul 01, 1945   Age/ Sex: 76 y.o., male    PCP: Valerie Roys, DO   Reason for Endocrinology Evaluation: Type 2 Diabetes Mellitus     Date of Initial Endocrinology Visit: 10/20/2021     PATIENT IDENTIFIER: [name] is a 76 y.o. male with a past medical history of T2DM, CAD. The patient presented for initial endocrinology clinic visit on 10/20/2021 for consultative assistance with his diabetes management.    HPI: Samuel Morris was    Diagnosed with DM in 2018 Prior Medications tried/Intolerance: *** Currently checking blood sugars *** x / day,  before breakfast and ***.  Hypoglycemia episodes : ***               Symptoms: ***                 Frequency: ***/  Hemoglobin A1c has ranged from *** in ***, peaking at *** in ***. Patient required assistance for hypoglycemia:  Patient has required hospitalization within the last 1 year from hyper or hypoglycemia:   In terms of diet, the patient ***   HOME DIABETES REGIMEN: Farxiga 5 mg daily    Statin: yes ACE-I/ARB: yes Prior Diabetic Education: {Yes/No:11203}   METER DOWNLOAD SUMMARY: Date range evaluated: *** Fingerstick Blood Glucose Tests = *** Average Number Tests/Day = *** Overall Mean FS Glucose = *** Standard Deviation = ***  BG Ranges: Low = *** High = ***   Hypoglycemic Events/30 Days: BG < 50 = *** Episodes of symptomatic severe hypoglycemia = ***   DIABETIC COMPLICATIONS: Microvascular complications:  *** Denies: *** Last eye exam: Completed   Macrovascular complications:  *** Denies: CAD, PVD, CVA   PAST HISTORY: Past Medical History:  Past Medical History:  Diagnosis Date   Arrhythmia    paroxysmal atrial fibrillation   Cancer ([place])    Scalp   Cataract    CHF (congestive heart failure) ([place])    COVID-19 11/16/2020   Diabetes mellitus without complication ([place])    Heart attack ([place])    Heart disease    Hyperlipidemia    Hypertension    Sleep  apnea    Stenosis of aorta    Ventricular tachycardia (HCC)    Past Surgical History:  Past Surgical History:  Procedure Laterality Date   [place]     LEFT HEART CATH AND CORS/GRAFTS ANGIOGRAPHY N/A 01/10/2021   Procedure: LEFT HEART CATH AND CORS/GRAFTS ANGIOGRAPHY;  Surgeon: Nelva Bush, MD;  Location: [facility];  Service: Cardiovascular;  Laterality: N/A;    Social History:  reports that he has never smoked. He has never used smokeless tobacco. He reports current alcohol use. He reports that he does not use drugs. Family History:  Family History  Problem Relation Age of Onset   Heart disease Mother    Heart attack Father    Arthritis Sister    Hypertension Sister    Heart murmur Sister    Hypertension Brother    Prostate cancer Brother    Heart disease Paternal Grandmother      HOME MEDICATIONS: Allergies as of 10/23/2021   No Known Allergies      Medication List        Accurate as of October 20, 2021  2:53 PM. If you have any questions, ask your nurse or doctor.  albuterol 108 (90 Base) MCG/ACT inhaler Commonly known as: Ventolin HFA Inhale 1 puff into the lungs every 4 (four) hours as needed for wheezing or shortness of breath.   aspirin EC 81 MG tablet Take 81 mg by mouth daily.   benzonatate 200 MG capsule Commonly known as: TESSALON Take 1 capsule (200 mg total) by mouth 2 (two) times daily as needed for cough.   Calcium-Magnesium 100-50 MG Tabs Take 1 tablet by mouth daily.   cyanocobalamin 1000 MCG tablet Commonly known as: VITAMIN B12 Take by mouth daily.   dapagliflozin propanediol 5 MG Tabs tablet Commonly known as: Farxiga Take 1 tablet (5 mg total) by mouth daily.   diclofenac Sodium 1 % Gel Commonly known as: VOLTAREN   Entresto 49-51 MG Generic drug: sacubitril-valsartan Take 1 tablet by mouth 2 (two) times daily.   ezetimibe 10 MG  tablet Commonly known as: ZETIA TAKE 1 TABLET BY MOUTH EVERY DAY   fluticasone 50 MCG/ACT nasal spray Commonly known as: FLONASE Place 1 spray into both nostrils daily.   fluticasone furoate-vilanterol 100-25 MCG/ACT Aepb Commonly known as: [name] Ellipta Inhale 1 puff into the lungs daily.   folic acid 1 MG tablet Commonly known as: FOLVITE 1 tablet   furosemide 40 MG tablet Commonly known as: LASIX Take 1 tablet (40 mg total) by mouth daily.   gabapentin 100 MG capsule Commonly known as: NEURONTIN Take 100 mg by mouth 3 (three) times daily.   hydroxychloroquine 200 MG tablet Commonly known as: PLAQUENIL Take by mouth.   isosorbide mononitrate 30 MG 24 hr tablet Commonly known as: IMDUR TAKE 1/2 OF A TABLET (15 MG TOTAL) BY MOUTH DAILY   methotrexate 2.5 MG tablet Commonly known as: RHEUMATREX Take 15 mg by mouth once a week.   metoprolol succinate 100 MG 24 hr tablet Commonly known as: TOPROL-XL Take 100 mg by mouth daily. Take with or immediately following a meal.   mexiletine 150 MG capsule Commonly known as: MEXITIL Take 150 mg by mouth 2 (two) times daily.   nitroGLYCERIN 0.4 MG SL tablet Commonly known as: NITROSTAT Place 1 tablet (0.4 mg total) under the tongue every 5 (five) minutes as needed for chest pain.   QC TUMERIC COMPLEX PO Take by mouth daily. Unsure of dose   rivaroxaban 20 MG Tabs tablet Commonly known as: XARELTO Take by mouth.   rosuvastatin 20 MG tablet Commonly known as: CRESTOR TAKE 1 TABLET BY MOUTH AT BEDTIME   spironolactone 25 MG tablet Commonly known as: ALDACTONE Take 1 tablet (25 mg total) by mouth daily.   VITAMIN C PO Take 100 mg by mouth daily.   Vitamin D-1000 Max St 25 MCG (1000 UT) tablet Generic drug: Cholecalciferol Take 1,000 Units by mouth daily.         ALLERGIES: No Known Allergies   REVIEW OF SYSTEMS: A comprehensive ROS was conducted with the patient and is negative except as per HPI and below:   ROS    OBJECTIVE:   VITAL SIGNS: There were no vitals taken for this visit.   PHYSICAL EXAM:  General: Pt appears well and is in NAD  Neck: General: Supple without adenopathy or carotid bruits. Thyroid: Thyroid size normal.  No goiter or nodules appreciated  Lungs: Clear with good BS bilat with no rales, rhonchi, or wheezes  Heart: RRR with normal S1 and S2 and no gallops; no murmurs; no rub  Abdomen: Normoactive bowel sounds, soft, nontender, without masses or organomegaly palpable  Extremities:  Lower extremities - No pretibial edema. No lesions.  Neuro: MS is good with appropriate affect, pt is alert and Ox3    DM foot exam:    DATA REVIEWED:  Lab Results  Component Value Date   HGBA1C 6.4 (H) 04/13/2021   HGBA1C 6.8 (H) 01/12/2021   HGBA1C 6.9 10/11/2020   Lab Results  Component Value Date   MICROALBUR 10 09/10/2019   LDLCALC 74 04/13/2021   CREATININE 0.90 04/13/2021   Lab Results  Component Value Date   MICRALBCREAT <30 09/10/2019    Lab Results  Component Value Date   CHOL 134 04/13/2021   HDL 43 04/13/2021   LDLCALC 74 04/13/2021   TRIG 87 04/13/2021        ASSESSMENT / PLAN / RECOMMENDATIONS:   1) Type *** Diabetes Mellitus, ***controlled, With Macrovascular complications - Most recent A1c of *** %. Goal A1c < 7.0 %.    Plan: GENERAL: ***  MEDICATIONS: ***  EDUCATION / INSTRUCTIONS: BG monitoring instructions: Patient is instructed to check his blood sugars *** times a day, ***. Call [facility] if: BG persistently < 70. I reviewed the Rule of 15 for the treatment of hypoglycemia in detail with the patient. Literature supplied.   2) Diabetic complications:  Eye: Does *** have known diabetic retinopathy.  Neuro/ Feet: Does *** have known diabetic peripheral neuropathy. Renal: Patient does *** have known baseline CKD. He is *** on an ACEI/ARB at present.Check urine albumin/creatinine ratio yearly starting at time of  diagnosis. If albuminuria is positive, treatment is geared toward better glucose, blood pressure control and use of ACE inhibitors or ARBs. Monitor electrolytes and creatinine once to twice yearly.   3) Lipids: Patient is *** on a statin.    4) Hypertension: ***  at goal of < 140/90 mmHg.       Signed electronically by: Mack Guise, MD  [facility] Endocrinology  [facility] [address] [place], [place] [address] Phone: [phone] FAX: [phone]   CC: [address] [address] Phone: [phone]  Fax: [phone]    Return to Endocrinology clinic as below: Future Appointments  Date Time Provider [place]  10/23/2021 11:30 AM Anyelo Mccue, Melanie Crazier, MD [facility] None  12/18/2021  9:00 AM [facility]  01/12/2022  7:25 AM [facility] DEVICE REMOTES [facility]

## 2021-10-23 ENCOUNTER — Ambulatory Visit: Payer: Medicare Other | Admitting: Internal Medicine

## 2021-10-25 LAB — HM DIABETES EYE EXAM

## 2021-10-27 ENCOUNTER — Other Ambulatory Visit: Payer: Self-pay | Admitting: Family Medicine

## 2021-10-27 NOTE — Telephone Encounter (Signed)
Requested Prescriptions  Pending Prescriptions Disp Refills  . isosorbide mononitrate (IMDUR) 30 MG 24 hr tablet [Pharmacy Med Name: ISOSORBIDE MONONIT ER 30 MG TB] 45 tablet 0    Sig: TAKE 1/2 OF A TABLET (15 MG TOTAL) BY MOUTH DAILY     Cardiovascular:  Nitrates Passed - 10/27/2021  2:09 AM      Passed - Last BP in normal range    BP Readings from Last 1 Encounters:  08/15/21 120/65         Passed - Last Heart Rate in normal range    Pulse Readings from Last 1 Encounters:  08/15/21 81         Passed - Valid encounter within last 12 months    Recent Outpatient Visits          2 months ago Skin tear of right elbow without complication, initial encounter   [facility] Jon Billings, NP   2 months ago Interstitial lung disease ([place])   [place], Megan P, DO   3 months ago Subacute cough   [place], Megan P, DO   3 months ago Upper respiratory tract infection, unspecified type   [facility] Vigg, Avanti, MD   3 months ago Acute non-recurrent maxillary sinusitis   [place], [place], DO

## 2021-10-29 ENCOUNTER — Other Ambulatory Visit: Payer: Self-pay | Admitting: Family Medicine

## 2021-10-31 NOTE — Telephone Encounter (Signed)
Requested medication (s) are due for refill today - unsure  Requested medication (s) are on the active medication list -yes  Future visit scheduled -no  Last refill: 01/19/21  Notes to clinic: listed as historical provider- sent for provider review   Requested Prescriptions  Pending Prescriptions Disp Refills   metoprolol succinate (TOPROL-XL) 100 MG 24 hr tablet [Pharmacy Med Name: METOPROLOL SUCC ER 100 MG TAB] 135 tablet 1    Sig: TAKE 1 AND 1/2 TABLETS BY MOUTH DAILY WITH OR IMMEDIATELY FOLLOWING A MEAL     Cardiovascular:  Beta Blockers Passed - 10/29/2021  2:37 PM      Passed - Last BP in normal range    BP Readings from Last 1 Encounters:  08/15/21 120/65         Passed - Last Heart Rate in normal range    Pulse Readings from Last 1 Encounters:  08/15/21 81         Passed - Valid encounter within last 6 months    Recent Outpatient Visits           2 months ago Skin tear of right elbow without complication, initial encounter   [facility] Jon Billings, NP   2 months ago Interstitial lung disease ([place])   [place], Megan P, DO   3 months ago Subacute cough   [facility] [place], Megan P, DO   3 months ago Upper respiratory tract infection, unspecified type   [facility] Vigg, Avanti, MD   4 months ago Acute non-recurrent maxillary sinusitis   [facility] [place], Megan P, DO                 Requested Prescriptions  Pending Prescriptions Disp Refills   metoprolol succinate (TOPROL-XL) 100 MG 24 hr tablet [Pharmacy Med Name: METOPROLOL SUCC ER 100 MG TAB] 135 tablet 1    Sig: TAKE 1 AND 1/2 TABLETS BY MOUTH DAILY WITH OR IMMEDIATELY FOLLOWING A MEAL     Cardiovascular:  Beta Blockers Passed - 10/29/2021  2:37 PM      Passed - Last BP in normal range    BP Readings from Last 1 Encounters:  08/15/21 120/65         Passed - Last Heart Rate in normal range    Pulse Readings  from Last 1 Encounters:  08/15/21 81         Passed - Valid encounter within last 6 months    Recent Outpatient Visits           2 months ago Skin tear of right elbow without complication, initial encounter   [facility] Jon Billings, NP   2 months ago Interstitial lung disease ([place])   [place], Megan P, DO   3 months ago Subacute cough   [facility] [place], Megan P, DO   3 months ago Upper respiratory tract infection, unspecified type   [facility] Vigg, Avanti, MD   4 months ago Acute non-recurrent maxillary sinusitis   [facility] [place], [place], DO

## 2021-10-31 NOTE — Telephone Encounter (Signed)
Lvm asking patient to call back to schedule an appointment 

## 2021-10-31 NOTE — Telephone Encounter (Signed)
Patient is overdue for 6 month f/up. Please schedule appointment then route to provider for refill.

## 2021-11-02 NOTE — Progress Notes (Signed)
Remote ICD transmission.   

## 2021-11-24 ENCOUNTER — Other Ambulatory Visit: Payer: Self-pay | Admitting: Family Medicine

## 2021-11-24 NOTE — Telephone Encounter (Signed)
Requested medications are due for refill today.  no  Requested medications are on the active medications list.  yes  Last refill. 11/13/2021 #45 1 refill  Future visit scheduled.   yes  Notes to clinic.  Pt is requesting a 90 day supply.    Requested Prescriptions  Pending Prescriptions Disp Refills   metoprolol succinate (TOPROL-XL) 100 MG 24 hr tablet [Pharmacy Med Name: METOPROLOL SUCC ER 100 MG TAB] 135 tablet 1    Sig: TAKE 1 AND 1/2 TABLETS BY MOUTH DAILY WITH OR IMMEDIATELY FOLLOWING A MEAL     Cardiovascular:  Beta Blockers Passed - 11/24/2021  9:44 AM      Passed - Last BP in normal range    BP Readings from Last 1 Encounters:  08/15/21 120/65         Passed - Last Heart Rate in normal range    Pulse Readings from Last 1 Encounters:  08/15/21 81         Passed - Valid encounter within last 6 months    Recent Outpatient Visits           3 months ago Skin tear of right elbow without complication, initial encounter   [facility] Jon Billings, NP   3 months ago Interstitial lung disease ([place])   [place], Megan P, DO   4 months ago Subacute cough   [place], Megan P, DO   4 months ago Upper respiratory tract infection, unspecified type   [facility] Vigg, Avanti, MD   4 months ago Acute non-recurrent maxillary sinusitis   [facility] [place], [place], DO       Future Appointments             In 3 days Wynetta Emery, Barb Merino, DO [employer], [facility]

## 2021-11-27 ENCOUNTER — Ambulatory Visit (INDEPENDENT_AMBULATORY_CARE_PROVIDER_SITE_OTHER): Payer: Medicare Other | Admitting: Family Medicine

## 2021-11-27 ENCOUNTER — Encounter: Payer: Self-pay | Admitting: Family Medicine

## 2021-11-27 VITALS — BP 146/88 | HR 81 | Temp 98.0°F | Wt 178.6 lb

## 2021-11-27 DIAGNOSIS — E1169 Type 2 diabetes mellitus with other specified complication: Secondary | ICD-10-CM | POA: Diagnosis not present

## 2021-11-27 DIAGNOSIS — E1159 Type 2 diabetes mellitus with other circulatory complications: Secondary | ICD-10-CM

## 2021-11-27 DIAGNOSIS — I255 Ischemic cardiomyopathy: Secondary | ICD-10-CM | POA: Diagnosis not present

## 2021-11-27 DIAGNOSIS — I10 Essential (primary) hypertension: Secondary | ICD-10-CM

## 2021-11-27 DIAGNOSIS — E785 Hyperlipidemia, unspecified: Secondary | ICD-10-CM

## 2021-11-27 LAB — URINALYSIS, ROUTINE W REFLEX MICROSCOPIC
Bilirubin, UA: NEGATIVE
Ketones, UA: NEGATIVE
Leukocytes,UA: NEGATIVE
Nitrite, UA: NEGATIVE
RBC, UA: NEGATIVE
Specific Gravity, UA: >1.03 — ABNORMAL HIGH (ref 1.005–1.030)
Urobilinogen, Ur: 0.2 mg/dL (ref 0.2–1.0)
pH, UA: 5.5 (ref 5.0–7.5)

## 2021-11-27 LAB — MICROALBUMIN, URINE WAIVED
Creatinine, Urine Waived: 100 mg/dL (ref 10–300)
Microalb, Ur Waived: 80 mg/L — ABNORMAL HIGH (ref 0–19)

## 2021-11-27 LAB — BAYER DCA HB A1C WAIVED: HB A1C (BAYER DCA - WAIVED): 6.4 % — ABNORMAL HIGH (ref 4.8–5.6)

## 2021-11-27 MED ORDER — FLUTICASONE FUROATE-VILANTEROL 100-25 MCG/ACT IN AEPB: 1.0000 | INHALATION_SPRAY | Freq: Every day | RESPIRATORY_TRACT | 3 refills | Status: DC

## 2021-11-27 MED ORDER — FUROSEMIDE 40 MG PO TABS: 40.0000 mg | ORAL_TABLET | Freq: Every day | ORAL | 1 refills | Status: DC

## 2021-11-27 MED ORDER — ROSUVASTATIN CALCIUM 20 MG PO TABS: 20.0000 mg | ORAL_TABLET | Freq: Every day | ORAL | 1 refills | Status: DC

## 2021-11-27 MED ORDER — DAPAGLIFLOZIN PROPANEDIOL 5 MG PO TABS: 5.0000 mg | ORAL_TABLET | Freq: Every day | ORAL | 1 refills | Status: DC

## 2021-11-27 MED ORDER — ISOSORBIDE MONONITRATE ER 30 MG PO TB24: ORAL_TABLET | ORAL | 1 refills | Status: DC

## 2021-11-27 MED ORDER — SPIRONOLACTONE 25 MG PO TABS: 25.0000 mg | ORAL_TABLET | Freq: Every day | ORAL | 1 refills | Status: DC

## 2021-11-27 MED ORDER — PREDNISONE 10 MG PO TABS: ORAL_TABLET | ORAL | 0 refills | Status: DC

## 2021-11-27 MED ORDER — METOPROLOL SUCCINATE ER 100 MG PO TB24: ORAL_TABLET | ORAL | 1 refills | Status: DC

## 2021-11-27 MED ORDER — EZETIMIBE 10 MG PO TABS: 10.0000 mg | ORAL_TABLET | Freq: Every day | ORAL | 1 refills | Status: DC

## 2021-11-27 NOTE — Assessment & Plan Note (Signed)
Under good control on current regimen. Continue current regimen. Continue to monitor. Call with any concerns. Refills given. Labs drawn today.   

## 2021-11-27 NOTE — Assessment & Plan Note (Signed)
Doing well with A1c of 6.4. Continue current regimen. Continue to monitor. Call with any concerns.

## 2021-11-27 NOTE — Progress Notes (Signed)
BP (!) 146/88 (BP Location: Left Arm, Cuff Size: Normal)   Pulse 81   Temp 98 F (36.7 C)   Wt 178 lb 9.6 oz (81 kg)   SpO2 96%   BMI 30.42 kg/m    Subjective:    Patient ID: Samuel Morris, male    DOB: [birth year], 76 y.o.   MRN: [id number]  HPI: Samuel Morris is a 76 y.o. male  Chief Complaint  Patient presents with   Diabetes   Hypertension   Coronary Artery Disease   Hyperlipidemia   URI    Patient states he has been coughing and has chest congestion for about 2-3 days. No other symptoms.    DIABETES Hypoglycemic episodes:no Polydipsia/polyuria: no Visual disturbance: no Chest pain: no Paresthesias: no Glucose Monitoring: yes  Accucheck frequency:  occasionally Taking Insulin?: no Blood Pressure Monitoring: not checking Retinal Examination: Not up to Date Foot Exam: Not up to Date Diabetic Education: Not Completed Pneumovax: unknown Influenza: Not up to Date Aspirin: yes  HYPERTENSION / HYPERLIPIDEMIA Satisfied with current treatment? yes Duration of hypertension: chronic BP monitoring frequency: a few times a month BP medication side effects: no Past BP meds: spironlactone, metoprolol, imdur, lasix Duration of hyperlipidemia: chronic Cholesterol medication side effects: no Cholesterol supplements: none Past cholesterol medications: crestor, zetia Medication compliance: excellent compliance Aspirin: no Recent stressors: no Recurrent headaches: no Visual changes: no Palpitations: no Dyspnea: no Chest pain: no Lower extremity edema: no Dizzy/lightheaded: no  UPPER RESPIRATORY TRACT INFECTION Duration: 3-4 days Worst symptom: congestion Fever: no Cough: yes Shortness of breath: no Wheezing: no Chest pain: no Chest tightness: no Chest congestion: no Nasal congestion: no Runny nose: no Post nasal drip: no Sneezing: no Sore throat: no Swollen glands: no Sinus pressure: no Headache: no Face pain: no Toothache: no Ear pain: no  Ear  pressure: no  Eyes red/itching:no Eye drainage/crusting: no  Vomiting: no Rash: no Fatigue: yes Sick contacts: no Strep contacts: no  Context:  just starting Recurrent sinusitis: no Relief with OTC cold/cough medications: no  Treatments attempted: nothing    Relevant past medical, surgical, family and social history reviewed and updated as indicated. Interim medical history since our last visit reviewed. Allergies and medications reviewed and updated.  Review of Systems  Constitutional: Negative.   HENT: Negative.    Respiratory:  Positive for cough and shortness of breath. Negative for apnea, choking, chest tightness, wheezing and stridor.   Cardiovascular: Negative.   Gastrointestinal: Negative.   Musculoskeletal:  Positive for arthralgias and myalgias. Negative for back pain, gait problem, joint swelling, neck pain and neck stiffness.  Skin: Negative.   Neurological: Negative.   Psychiatric/Behavioral: Negative.      Per HPI unless specifically indicated above     Objective:    BP (!) 146/88 (BP Location: Left Arm, Cuff Size: Normal)   Pulse 81   Temp 98 F (36.7 C)   Wt 178 lb 9.6 oz (81 kg)   SpO2 96%   BMI 30.42 kg/m   Wt Readings from Last 3 Encounters:  11/27/21 178 lb 9.6 oz (81 kg)  08/15/21 181 lb 6.4 oz (82.3 kg)  08/10/21 183 lb 6.4 oz (83.2 kg)    Physical Exam Vitals and nursing note reviewed.  Constitutional:      General: He is not in acute distress.    Appearance: Normal appearance. He is obese. He is not ill-appearing, toxic-appearing or diaphoretic.  HENT:     Head: Normocephalic and atraumatic.  Right Ear: Tympanic membrane, ear canal and external ear normal.     Left Ear: Tympanic membrane, ear canal and external ear normal.     Nose: Congestion and rhinorrhea present.     Mouth/Throat:     Mouth: Mucous membranes are moist.     Pharynx: Oropharynx is clear. No oropharyngeal exudate or posterior oropharyngeal erythema.  Eyes:      General: No scleral icterus.       Right eye: No discharge.        Left eye: No discharge.     Extraocular Movements: Extraocular movements intact.     Conjunctiva/sclera: Conjunctivae normal.     Pupils: Pupils are equal, round, and reactive to light.  Cardiovascular:     Rate and Rhythm: Normal rate and regular rhythm.     Pulses: Normal pulses.     Heart sounds: Normal heart sounds. No murmur heard.    No friction rub. No gallop.  Pulmonary:     Effort: Pulmonary effort is normal. No respiratory distress.     Breath sounds: Normal breath sounds. No stridor. No wheezing, rhonchi or rales.  Chest:     Chest wall: No tenderness.  Musculoskeletal:        General: Normal range of motion.     Cervical back: Normal range of motion and neck supple.  Skin:    General: Skin is warm and dry.     Capillary Refill: Capillary refill takes less than 2 seconds.     Coloration: Skin is not jaundiced or pale.     Findings: No bruising, erythema, lesion or rash.  Neurological:     General: No focal deficit present.     Mental Status: He is alert and oriented to person, place, and time. Mental status is at baseline.  Psychiatric:        Mood and Affect: Mood normal.        Behavior: Behavior normal.        Thought Content: Thought content normal.        Judgment: Judgment normal.     Results for orders placed or performed in visit on 11/27/21  Urinalysis, Routine w reflex microscopic  Result Value Ref Range   Specific Gravity, UA >1.030 (H) 1.005 - 1.030   pH, UA 5.5 5.0 - 7.5   Color, UA Yellow Yellow   Appearance Ur Clear Clear   Leukocytes,UA Negative Negative   Protein,UA Trace (A) Negative/Trace   Glucose, UA 2+ (A) Negative   Ketones, UA Negative Negative   RBC, UA Negative Negative   Bilirubin, UA Negative Negative   Urobilinogen, Ur 0.2 0.2 - 1.0 mg/dL   Nitrite, UA Negative Negative  Microalbumin, Urine Waived  Result Value Ref Range   Microalb, Ur Waived 80 (H) 0 - 19  mg/L   Creatinine, Urine Waived 100 10 - 300 mg/dL   Microalb/Creat Ratio 30-300 (H) <30 mg/g  Bayer DCA Hb A1c Waived  Result Value Ref Range   HB A1C (BAYER DCA - WAIVED) 6.4 (H) 4.8 - 5.6 %      Assessment & Plan:   Problem List Items Addressed This Visit       Cardiovascular and Mediastinum   Type 2 diabetes mellitus with cardiac complication (HCC)    Doing well with A1c of 6.4. Continue current regimen. Continue to monitor. Call with any concerns.       Relevant Medications   rosuvastatin (CRESTOR) 20 MG tablet   spironolactone (ALDACTONE) 25 MG  tablet   isosorbide mononitrate (IMDUR) 30 MG 24 hr tablet   metoprolol succinate (TOPROL-XL) 100 MG 24 hr tablet   furosemide (LASIX) 40 MG tablet   ezetimibe (ZETIA) 10 MG tablet   dapagliflozin propanediol (FARXIGA) 5 MG TABS tablet   Other Relevant Orders   Comprehensive metabolic panel   CBC with Differential/Platelet   Urinalysis, Routine w reflex microscopic (Completed)   Microalbumin, Urine Waived (Completed)   Bayer DCA Hb A1c Waived (Completed)   HTN (hypertension)    Under good control on current regimen. Continue current regimen. Continue to monitor. Call with any concerns. Refills given. Labs drawn today.       Relevant Medications   rosuvastatin (CRESTOR) 20 MG tablet   spironolactone (ALDACTONE) 25 MG tablet   isosorbide mononitrate (IMDUR) 30 MG 24 hr tablet   metoprolol succinate (TOPROL-XL) 100 MG 24 hr tablet   furosemide (LASIX) 40 MG tablet   ezetimibe (ZETIA) 10 MG tablet   Other Relevant Orders   Comprehensive metabolic panel   CBC with Differential/Platelet   TSH     Endocrine   Hyperlipidemia associated with type 2 diabetes mellitus ([place]) - Primary    Under good control on current regimen. Continue current regimen. Continue to monitor. Call with any concerns. Refills given. Labs drawn today.       Relevant Medications   rosuvastatin (CRESTOR) 20 MG tablet   spironolactone (ALDACTONE) 25 MG  tablet   isosorbide mononitrate (IMDUR) 30 MG 24 hr tablet   metoprolol succinate (TOPROL-XL) 100 MG 24 hr tablet   furosemide (LASIX) 40 MG tablet   ezetimibe (ZETIA) 10 MG tablet   dapagliflozin propanediol (FARXIGA) 5 MG TABS tablet   Other Relevant Orders   Comprehensive metabolic panel   CBC with Differential/Platelet   Lipid Panel w/o Chol/HDL Ratio     Follow up plan: Return in about 6 months (around 05/28/2022).

## 2021-11-28 LAB — CBC WITH DIFFERENTIAL/PLATELET
Basophils Absolute: 0.1 10*3/uL (ref 0.0–0.2)
Basos: 1 %
EOS (ABSOLUTE): 0.3 10*3/uL (ref 0.0–0.4)
Eos: 4 %
Hematocrit: 42.6 % (ref 37.5–51.0)
Hemoglobin: 14.5 g/dL (ref 13.0–17.7)
Immature Grans (Abs): 0.1 10*3/uL (ref 0.0–0.1)
Immature Granulocytes: 1 %
Lymphocytes Absolute: 1.1 10*3/uL (ref 0.7–3.1)
Lymphs: 13 %
MCH: 29.5 pg (ref 26.6–33.0)
MCHC: 34 g/dL (ref 31.5–35.7)
MCV: 87 fL (ref 79–97)
Monocytes Absolute: 0.9 10*3/uL (ref 0.1–0.9)
Monocytes: 11 %
Neutrophils Absolute: 6.1 10*3/uL (ref 1.4–7.0)
Neutrophils: 70 %
Platelets: 304 10*3/uL (ref 150–450)
RBC: 4.91 x10E6/uL (ref 4.14–5.80)
RDW: 13.3 % (ref 11.6–15.4)
WBC: 8.5 10*3/uL (ref 3.4–10.8)

## 2021-11-28 LAB — LIPID PANEL W/O CHOL/HDL RATIO
Cholesterol, Total: 163 mg/dL (ref 100–199)
HDL: 41 mg/dL (ref >39)
LDL Chol Calc (NIH): 98 mg/dL (ref 0–99)
Triglycerides: 135 mg/dL (ref 0–149)
VLDL Cholesterol Cal: 24 mg/dL (ref 5–40)

## 2021-11-28 LAB — COMPREHENSIVE METABOLIC PANEL
ALT: 23 IU/L (ref 0–44)
AST: 18 IU/L (ref 0–40)
Albumin/Globulin Ratio: 1.4 (ref 1.2–2.2)
Albumin: 3.8 g/dL (ref 3.8–4.8)
Alkaline Phosphatase: 117 IU/L (ref 44–121)
BUN/Creatinine Ratio: 16 (ref 10–24)
BUN: 14 mg/dL (ref 8–27)
Bilirubin Total: 0.3 mg/dL (ref 0.0–1.2)
CO2: 20 mmol/L (ref 20–29)
Calcium: 9.3 mg/dL (ref 8.6–10.2)
Chloride: 99 mmol/L (ref 96–106)
Creatinine, Ser: 0.89 mg/dL (ref 0.76–1.27)
Globulin, Total: 2.7 g/dL (ref 1.5–4.5)
Glucose: 105 mg/dL — ABNORMAL HIGH (ref 70–99)
Potassium: 4.4 mmol/L (ref 3.5–5.2)
Sodium: 138 mmol/L (ref 134–144)
Total Protein: 6.5 g/dL (ref 6.0–8.5)
eGFR: 88 mL/min/{1.73_m2} (ref >59)

## 2021-11-28 LAB — TSH: TSH: 4.52 u[IU]/mL — ABNORMAL HIGH (ref 0.450–4.500)

## 2021-12-04 ENCOUNTER — Other Ambulatory Visit: Payer: Self-pay | Admitting: Family Medicine

## 2021-12-04 DIAGNOSIS — E039 Hypothyroidism, unspecified: Secondary | ICD-10-CM

## 2021-12-04 DIAGNOSIS — R7989 Other specified abnormal findings of blood chemistry: Secondary | ICD-10-CM

## 2021-12-18 ENCOUNTER — Ambulatory Visit (INDEPENDENT_AMBULATORY_CARE_PROVIDER_SITE_OTHER): Payer: Medicare Other

## 2021-12-18 DIAGNOSIS — I10 Essential (primary) hypertension: Secondary | ICD-10-CM

## 2021-12-18 DIAGNOSIS — E1169 Type 2 diabetes mellitus with other specified complication: Secondary | ICD-10-CM

## 2021-12-18 DIAGNOSIS — E1159 Type 2 diabetes mellitus with other circulatory complications: Secondary | ICD-10-CM

## 2021-12-18 NOTE — Progress Notes (Signed)
.ccm Chronic Care Management Pharmacy Note  12/18/2021 Name:  Samuel Morris MRN:  [id number] DOB:  08/11/1945  Summary: Pleasant 76 year old male presents for f/u CCM visit. 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 His wife is from [place]. They also lived in [place] for 35 years. Unable to use Upstream  Recommendations to Provider: Patient at Cardio office during visit, unable to go into too much detail Entresto and [name] PAP mailed out in July. Patient hasn't heard back. Will have CCM team look into this   Subjective: Samuel Morris is an 76 y.o. year old male who is a primary patient of Johnson, Megan P, DO.  The CCM team was consulted for assistance with disease management and care coordination needs.    Engaged with patient by telephone for follow up visit in response to provider referral for pharmacy case management and/or care coordination services.   Consent to Services:  The patient was given information about Chronic Care Management services, agreed to services, and gave verbal consent prior to initiation of services.  Please see initial visit note for detailed documentation.   Patient Care Team: Johnson, Megan P, DO as PCP - General (Family Medicine) Lambert, Cameron T, MD as PCP - Electrophysiology (Cardiology) End, Christopher, MD as PCP - Cardiology (Cardiology) ,  K, RPH (Pharmacist)  Objective:  Lab Results  Component Value Date   CREATININE 0.89 11/27/2021   CREATININE 0.90 04/13/2021   CREATININE 1.13 01/17/2021    Lab Results  Component Value Date   HGBA1C 6.4 (H) 11/27/2021   HGBA1C 6.4 (H) 04/13/2021   HGBA1C 6.8 (H) 01/12/2021   Last diabetic Eye exam:  Lab Results  Component Value Date/Time   HMDIABEYEEXA No Retinopathy 10/25/2021 12:00 AM    Last diabetic Foot exam: No results found for: "HMDIABFOOTEX"      Component Value Date/Time   CHOL 163 11/27/2021 1352   CHOL 134 04/13/2021 0829    CHOL 130 10/11/2020 0833   TRIG 135 11/27/2021 1352   TRIG 87 04/13/2021 0829   TRIG 98 10/11/2020 0833   HDL 41 11/27/2021 1352   HDL 43 04/13/2021 0829   HDL 54 10/11/2020 0833   LDLCALC 98 11/27/2021 1352   LDLCALC 74 04/13/2021 0829   LDLCALC 58 10/11/2020 0833       Latest Ref Rng & Units 11/27/2021    1:52 PM 04/13/2021    8:29 AM 01/17/2021   12:17 PM  Hepatic Function  Total Protein 6.0 - 8.5 g/dL 6.5  7.1  7.0   Albumin 3.8 - 4.8 g/dL 3.8  4.5  4.4   AST 0 - 40 IU/L 18  18  20   ALT 0 - 44 IU/L 23  27  28   Alk Phosphatase 44 - 121 IU/L 117  103  101   Total Bilirubin 0.0 - 1.2 mg/dL 0.3  0.5  0.5     Lab Results  Component Value Date/Time   TSH 4.520 (H) 11/27/2021 01:52 PM   TSH 3.240 11/06/2018 09:19 AM       Latest Ref Rng & Units 11/27/2021    1:52 PM 04/13/2021    8:29 AM 01/17/2021   12:17 PM  CBC  WBC 3.4 - 10.8 x10E3/uL 8.5  8.6  13.6   Hemoglobin 13.0 - 17.7 g/dL 14.5  14.7  14.6   Hematocrit 37.5 - 51.0 % 42.6  45.0  44.6   Platelets 150 - 450 x10E3/uL 304    283  284     No results found for: "VD25OH"  Clinical ASCVD:  The ASCVD Risk score (Arnett DK, et al., 2019) failed to calculate for the following reasons:   The patient has a prior MI or stroke diagnosis   Social History   Tobacco Use  Smoking Status Never  Smokeless Tobacco Never   BP Readings from Last 3 Encounters:  11/27/21 (!) 146/88  08/15/21 120/65  08/10/21 114/73   Pulse Readings from Last 3 Encounters:  11/27/21 81  08/15/21 81  08/10/21 67   Wt Readings from Last 3 Encounters:  11/27/21 178 lb 9.6 oz (81 kg)  08/15/21 181 lb 6.4 oz (82.3 kg)  08/10/21 183 lb 6.4 oz (83.2 kg)   BMI Readings from Last 3 Encounters:  11/27/21 30.42 kg/m  08/15/21 30.90 kg/m  08/10/21 31.24 kg/m    Assessment: Review of patient past medical history, allergies, medications, health status, including review of consultants reports, laboratory and other test data, was performed as  part of comprehensive evaluation and provision of chronic care management services.   SDOH:  (Social Determinants of Health) assessments and interventions performed:  SDOH Interventions    Flowsheet Row Chronic Care Management from 12/18/2021 in [facility] Chronic Care Management from 09/18/2021 in [facility] Clinical Support from 04/03/2021 in [facility] Chronic Care Management from 05/19/2019 in [facility]  SDOH Interventions      Food Insecurity Interventions -- -- Intervention Not Indicated --  Housing Interventions -- -- Intervention Not Indicated --  Transportation Interventions Intervention Not Indicated -- Intervention Not Indicated --  Financial Strain Interventions Other (Comment)  [PAP] Other (Comment)  [PAP] Intervention Not Indicated Other (Comment)  [patient assistance application]  Physical Activity Interventions -- -- Intervention Not Indicated --  Stress Interventions -- -- Intervention Not Indicated --  Social Connections Interventions -- -- Intervention Not Indicated --       CCM Care Plan  No Known Allergies  Medications Reviewed Today     Reviewed by ,  K, RPH (Pharmacist) on 12/18/21 at 0931  Med List Status: <None>   Medication Order Taking? Sig Documenting Provider Last Dose Status Informant  albuterol (VENTOLIN HFA) 108 (90 Base) MCG/ACT inhaler [id number] No Inhale 1 puff into the lungs every 4 (four) hours as needed for wheezing or shortness of breath. Johnson, Megan P, DO Taking Active   Ascorbic Acid (VITAMIN C PO) [id number] No Take 100 mg by mouth daily. [provider] Taking Active Self  aspirin EC 81 MG tablet [id number] No Take 81 mg by mouth daily. [provider] Taking Active Self  benzonatate (TESSALON) 200 MG capsule [id number] No Take 1 capsule (200 mg total) by mouth 2 (two) times daily as needed for cough.  Patient not taking: Reported on 11/27/2021   Vigg, Avanti,  MD Not Taking Active   Calcium-Magnesium 100-50 MG TABS [id number] No Take 1 tablet by mouth daily. [provider] Taking Active Self  Cholecalciferol (VITAMIN D-1000 MAX ST) 25 MCG (1000 UT) tablet [id number] No Take 1,000 Units by mouth daily. [provider] Taking Active Self  dapagliflozin propanediol (FARXIGA) 5 MG TABS tablet [id number]  Take 1 tablet (5 mg total) by mouth daily. Johnson, Megan P, DO  Active   diclofenac Sodium (VOLTAREN) 1 % GEL [id number] No  [provider] Taking Active Self  ENTRESTO 49-51 MG [id number] No Take 1 tablet by mouth 2 (two) times daily. [provider] Taking Active Self             Med Note Darnelle Maffucci, Arville Lime   Fri Dec 26, 2018  2:29 PM)    ezetimibe (ZETIA) 10 MG tablet [id number]  Take 1 tablet (10 mg total) by mouth daily. Johnson, Megan P, DO  Active   fluticasone (FLONASE) 50 MCG/ACT nasal spray [id number] No Place 1 spray into both nostrils daily. [provider] Taking Active   fluticasone furoate-vilanterol ([name] ELLIPTA) 100-25 MCG/ACT AEPB [id number]  Inhale 1 puff into the lungs daily. Johnson, Megan P, DO  Active   folic acid (FOLVITE) 1 MG tablet [id number] No 1 tablet [provider] Taking Active   furosemide (LASIX) 40 MG tablet [id number]  Take 1 tablet (40 mg total) by mouth daily. Johnson, Megan P, DO  Active   gabapentin (NEURONTIN) 100 MG capsule [id number] No Take 100 mg by mouth 3 (three) times daily.  Patient not taking: Reported on 11/27/2021   [provider] Not Taking Active   hydroxychloroquine (PLAQUENIL) 200 MG tablet [id number] No Take by mouth. [provider] Taking Active   isosorbide mononitrate (IMDUR) 30 MG 24 hr tablet [id number]  TAKE 1/2 OF A TABLET (15 MG TOTAL) BY MOUTH DAILY Johnson, Megan P, DO  Active   methotrexate (RHEUMATREX) 2.5 MG tablet [id number] No Take 15 mg by mouth once a week. [provider] Taking Active   metoprolol succinate  (TOPROL-XL) 100 MG 24 hr tablet [id number]  TAKE 1 AND 1/2 TABLETS BY MOUTH DAILY WITH OR IMMEDIATELY FOLLOWING A MEAL Johnson, Megan P, DO  Active   mexiletine (MEXITIL) 150 MG capsule [id number] No Take 150 mg by mouth 2 (two) times daily. [provider] Taking Active   nitroGLYCERIN (NITROSTAT) 0.4 MG SL tablet [id number] No Place 1 tablet (0.4 mg total) under the tongue every 5 (five) minutes as needed for chest pain. Minna Merritts, MD Taking Active   predniSONE (DELTASONE) 10 MG tablet [id number]  6 tabs today, 5 tabs tomorrow, decrease by 1 daily until gone [place], Megan P, DO  Active   rivaroxaban (XARELTO) 20 MG TABS tablet [id number] No Take by mouth. [provider] Taking Active   rosuvastatin (CRESTOR) 20 MG tablet [id number]  Take 1 tablet (20 mg total) by mouth at bedtime. Johnson, Megan P, DO  Active   spironolactone (ALDACTONE) 25 MG tablet [id number]  Take 1 tablet (25 mg total) by mouth daily. Johnson, Megan P, DO  Active   Turmeric (QC TUMERIC COMPLEX PO) [id number] No Take by mouth daily. Unsure of dose [provider] Taking Active Self  vitamin B-12 (CYANOCOBALAMIN) 1000 MCG tablet [id number] No Take by mouth daily. [provider] Taking Active Self            Patient Active Problem List   Diagnosis Date Noted   NSTEMI (non-ST elevated myocardial infarction) ([place]) 01/10/2021   Senile purpura ([place]) 10/11/2020   Rheumatoid arthritis ([place]) 07/09/2020   Interstitial lung disease ([place]) 07/09/2020   CAD (coronary artery disease) 11/05/2018   Hyperlipidemia associated with type 2 diabetes mellitus ([place]) 11/05/2018   Type 2 diabetes mellitus with cardiac complication ([place]) 35/24/8185   OSA (obstructive sleep apnea) 11/05/2018   HTN (hypertension) 11/05/2018   Aortic stenosis 11/05/2018   Presence of automatic (implantable) cardiac defibrillator 11/05/2018   Hx of melanoma of skin 11/05/2018   Angina pectoris ([place]) 11/05/2018   Pulmonary  nodules 11/05/2018   Dilated cardiomyopathy ([place]) 11/05/2018   Adhesive capsulitis of both shoulders 03/16/2018   AVN (avascular necrosis of bone) ([place]) 03/16/2018  Chronic pain of both shoulders 03/16/2018   Kyphoscoliosis deformity of spine 03/16/2018   Bilateral cataracts 01/20/2018   Overweight 12/18/2017   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 11/12/2017   DJD of right shoulder 04/24/2017   Right knee DJD 04/24/2017   S/P AVR (aortic valve replacement) 12/17/2016   VT (ventricular tachycardia) ([facility]) 07/27/2016   Ischemic cardiomyopathy 07/11/2016   Murmur 07/11/2016   H/O acute myocardial infarction 07/09/2016    Immunization History  Administered Date(s) Administered   Fluad Quad(high Dose 65+) 11/06/2018, 01/04/2021   Influenza, High Dose Seasonal PF 12/11/2016, 03/14/2018   PFIZER(Purple Top)SARS-COV-2 Vaccination 05/04/2019, 05/29/2019   Pneumococcal Conjugate-13 12/14/2013   Pneumococcal Polysaccharide-23 12/01/2014   Td 10/11/2020    Conditions to be addressed/monitored: T2DM, CHF, CAD s/p MI; OSA, HLD   Care Plan : ccm pharmacy care plan  Updates made by ,  K, RPH since 12/18/2021 12:00 AM     Problem: T2DM, CHF, CAD s/p MI; OSA, HLD   Priority: High  Onset Date: 03/29/2021     Long-Range Goal: disease management   Start Date: 03/29/2021  Recent Progress: On track  Priority: High  Note:   Current Barriers:  Unable to independently afford treatment regimen  Pharmacist Clinical Goal(s):  Patient will verbalize ability to afford treatment regimen through collaboration with PharmD and provider.   Interventions: 1:1 collaboration with Johnson, Megan P, DO regarding development and update of comprehensive plan of care as evidenced by provider attestation and co-signature Inter-disciplinary care team collaboration (see longitudinal plan of care) Comprehensive medication review performed; medication list updated in electronic  medical record  Hypertension (BP goal <130/80) BP Readings from Last 3 Encounters:  11/27/21 (!) 146/88  08/15/21 120/65  08/10/21 114/73  -Controlled -Current treatment: Imdur - Appropriate, Effective, Safe, Accessible Metoprolol succinate  100mg- Appropriate, Effective, Safe, Accessible Spironolactone  - Appropriate, Effective, Safe, Accessible Furosemide  - Appropriate, Effective, Safe, Accessible Entresto - Appropriate, Effective, Safe, query-Accessible -Denies hypotensive/hypertensive symptoms -Educated on BP goals and benefits of medications for prevention of heart attack, stroke and kidney damage; Exercise goal of 150 minutes per week; Importance of home blood pressure monitoring; -Counseled to monitor BP at home 1-2x/wk, document, and provide log at future appointments -Counseled on diet and exercise extensively July 2023: Entresto PAP October 2023: Patient states he submitted PAP but hasn't heard back. He was at the Cardio office during phone call. Will have CCM team reach out and determine PAP status  Hyperlipidemia: (LDL goal < 70) The ASCVD Risk score (Arnett DK, et al., 2019) failed to calculate for the following reasons:   The patient has a prior MI or stroke diagnosis Lab Results  Component Value Date   CHOL 163 11/27/2021   CHOL 134 04/13/2021   CHOL 130 10/11/2020   Lab Results  Component Value Date   HDL 41 11/27/2021   HDL 43 04/13/2021   HDL 54 10/11/2020   Lab Results  Component Value Date   LDLCALC 98 11/27/2021   LDLCALC 74 04/13/2021   LDLCALC 58 10/11/2020   Lab Results  Component Value Date   TRIG 135 11/27/2021   TRIG 87 04/13/2021   TRIG 98 10/11/2020  No results found for: "CHOLHDL" No results found for: "LDLDIRECT" -Controlled -Current treatment: Crestor 20 mg once daily  - Appropriate, Effective, Safe, Accessible Zetia 10 mg once daily  - Appropriate, Effective, Safe, Accessible -Side effect review - tolerating well, no  problems -Educated on Cholesterol goals;  Benefits of statin   for ASCVD risk reduction; -Recommended to continue current medication  Diabetes (A1c goal <7%) Lab Results  Component Value Date   HGBA1C 6.4 (H) 11/27/2021   HGBA1C 6.4 (H) 04/13/2021   HGBA1C 6.8 (H) 01/12/2021   Lab Results  Component Value Date   MICROALBUR 80 (H) 11/27/2021   LDLCALC 98 11/27/2021   CREATININE 0.89 11/27/2021   Lab Results  Component Value Date   NA 138 11/27/2021   K 4.4 11/27/2021   CREATININE 0.89 11/27/2021   EGFR 88 11/27/2021   GFRNONAA >60 01/12/2021   GLUCOSE 105 (H) 11/27/2021   Lab Results  Component Value Date   WBC 8.5 11/27/2021   HGB 14.5 11/27/2021   HCT 42.6 11/27/2021   MCV 87 11/27/2021   PLT 304 11/27/2021  -Controlled -Current medications: Farxiga 5 mg (PAP Approved) Appropriate, Effective, Safe, Accessible -Medications previously tried: N/A -Current home glucose readings fasting glucose: 100-120s post prandial glucose: n/a -Denies hypoglycemic/hyperglycemic symptoms -Recommended to continue current medication  COPD (Goal: control symptoms and prevent exacerbations) -Uncontrolled -Current treatment  [name] (Not taking, can't afford) Query Appropriate, Query effective, Query Safe, Query accessible -Medications previously tried: N/A  -MMRC/CAT score:      No data to display         -Pulmonary function testing:  PF Readings from Last 3 Encounters:  No data found for PF   -Exacerbations requiring treatment in last 6 months: N/A -Patient denies consistent use of maintenance inhaler -Frequency of rescue inhaler use: doesn't use -Counseled on Proper inhaler technique; July 2023: Can't afford [name], start PAP October 2023: Patient states he submitted PAP but hasn't heard back. He was at the [place] office during phone call. Will have CCM team reach out and determine PAP status  Patient Goals/Self-Care Activities Patient will:  - take medications as prescribed  as evidenced by patient report and record review  CPP F/U April 2024  [place] Constable, Sherian Rein.Keturah Barre [phone]     Patient Assistance Dapagliflozin: Approved 2023 [name]: Paperwork submitted July 2023 [name]:  Paperwork submitted July 2023  Patient's preferred pharmacy is:  [id number] [address]Phone: [phone]: 3[place]##[address]-[address] Heal [address]Phone: [phone]: [phone] [place] [address]Phone: [phone] Fax: [phone]-Unable to use Upstream  Pt endorses 100% compliance  Follow Up:  Patient agrees to Care Plan and Follow-up. Plan: CPP F/U April 2024  NAri[place] Constable PFloridaD. -- [phone]

## 2021-12-18 NOTE — Patient Instructions (Signed)
Visit Information   Goals Addressed   None    Patient Care Plan: ccm pharmacy care plan     Problem Identified: T2DM, CHF, CAD s/p MI; OSA, HLD   Priority: High  Onset Date: 03/29/2021     Long-Range Goal: disease management   Start Date: 03/29/2021  Recent Progress: On track  Priority: High  Note:   Current Barriers:  Unable to independently afford treatment regimen  Pharmacist Clinical Goal(s):  Patient will verbalize ability to afford treatment regimen through collaboration with PharmD and provider.   Interventions: 1:1 collaboration with Valerie Roys, DO regarding development and update of comprehensive plan of care as evidenced by provider attestation and co-signature Inter-disciplinary care team collaboration (see longitudinal plan of care) Comprehensive medication review performed; medication list updated in electronic medical record  Hypertension (BP goal <130/80) BP Readings from Last 3 Encounters:  11/27/21 (!) 146/88  08/15/21 120/65  08/10/21 114/73  -Controlled -Current treatment: Imdur - Appropriate, Effective, Safe, Accessible Metoprolol succinate  148m- Appropriate, Effective, Safe, Accessible Spironolactone  - Appropriate, Effective, Safe, Accessible Furosemide  - Appropriate, Effective, Safe, Accessible Entresto - Appropriate, Effective, Safe, query-Accessible -Denies hypotensive/hypertensive symptoms -Educated on BP goals and benefits of medications for prevention of heart attack, stroke and kidney damage; Exercise goal of 150 minutes per week; Importance of home blood pressure monitoring; -Counseled to monitor BP at home 1-2x/wk, document, and provide log at future appointments -Counseled on diet and exercise extensively July 2023: Entresto PAP October 2023: Patient states he submitted PAP but hasn't heard back. [place]. Will have CCM team reach out and determine PAP status  Hyperlipidemia: (LDL goal <  70) The ASCVD Risk score (Arnett DK, et al., 2019) failed to calculate for the following reasons:   The patient has a prior MI or stroke diagnosis Lab Results  Component Value Date   CHOL 163 11/27/2021   CHOL 134 04/13/2021   CHOL 130 10/11/2020   Lab Results  Component Value Date   HDL 41 11/27/2021   HDL 43 04/13/2021   HDL 54 10/11/2020   Lab Results  Component Value Date   LDLCALC 98 11/27/2021   LDLCALC 74 04/13/2021   LDLCALC 58 10/11/2020   Lab Results  Component Value Date   TRIG 135 11/27/2021   TRIG 87 04/13/2021   TRIG 98 10/11/2020  No results found for: "CHOLHDL" No results found for: "LDLDIRECT" -Controlled -Current treatment: Crestor 20 mg once daily  - Appropriate, Effective, Safe, Accessible Zetia 10 mg once daily  - Appropriate, Effective, Safe, Accessible -Side effect review - tolerating well, no problems -Educated on Cholesterol goals;  Benefits of statin for ASCVD risk reduction; -Recommended to continue current medication  Diabetes (A1c goal <7%) Lab Results  Component Value Date   HGBA1C 6.4 (H) 11/27/2021   HGBA1C 6.4 (H) 04/13/2021   HGBA1C 6.8 (H) 01/12/2021   Lab Results  Component Value Date   MICROALBUR 80 (H) 11/27/2021   LDLCALC 98 11/27/2021   CREATININE 0.89 11/27/2021   Lab Results  Component Value Date   NA 138 11/27/2021   K 4.4 11/27/2021   CREATININE 0.89 11/27/2021   EGFR 88 11/27/2021   GFRNONAA >60 01/12/2021   GLUCOSE 105 (H) 11/27/2021   Lab Results  Component Value Date   WBC 8.5 11/27/2021   HGB 14.5 11/27/2021   HCT 42.6 11/27/2021   MCV 87 11/27/2021   PLT 304 11/27/2021  -Controlled -Current medications: Farxiga  5 mg (PAP Approved) Appropriate, Effective, Safe, Accessible -Medications previously tried: N/A -Current home glucose readings fasting glucose: 100-120s post prandial glucose: n/a -Denies hypoglycemic/hyperglycemic symptoms -Recommended to continue current medication  COPD (Goal:  control symptoms and prevent exacerbations) -Uncontrolled -Current treatment  [name] (Not taking, can't afford) Query Appropriate, Query effective, Query Safe, Query accessible -Medications previously tried: N/A  -MMRC/CAT score:      No data to display         -Pulmonary function testing:  PF Readings from Last 3 Encounters:  No data found for PF   -Exacerbations requiring treatment in last 6 months: N/A -Patient denies consistent use of maintenance inhaler -Frequency of rescue inhaler use: doesn't use -Counseled on Proper inhaler technique; July 2023: Can't afford [name], start PAP October 2023: Patient states he submitted PAP but hasn't heard back. He was at the [place] office during phone call. Will have CCM team reach out and determine PAP status  Patient Goals/Self-Care Activities Patient will:  - take medications as prescribed as evidenced by patient report and record review  CPP F/U April 2024  [place] Constable, Sherian Rein.D. - [phone]      Samuel Morris was given information about Chronic Care Management services today including:  CCM service includes personalized support from designated clinical staff supervised by his physician, including individualized plan of care and coordination with other care providers 24/7 contact phone numbers for assistance for urgent and routine care needs. Standard insurance, coinsurance, copays and deductibles apply for chronic care management only during months in which we provide at least 20 minutes of these services. Most insurances cover these services at 100%, however patients may be responsible for any copay, coinsurance and/or deductible if applicable. This service may help you avoid the need for more expensive face-to-face services. Only one practitioner may furnish and bill the service in a calendar month. The patient may stop CCM services at any time (effective at the end of the month) by phone call to the office staff.  Patient agreed to  services and verbal consent obtained.   The patient verbalized understanding of instructions, educational materials, and care plan provided today and DECLINED offer to receive copy of patient instructions, educational materials, and care plan.  The pharmacy team will reach out to the patient again over the next 60 days.   Samuel Morris, [place]

## 2021-12-25 ENCOUNTER — Ambulatory Visit: Payer: Medicare Other | Admitting: Physician Assistant

## 2021-12-26 ENCOUNTER — Encounter: Payer: Self-pay | Admitting: Physician Assistant

## 2021-12-26 ENCOUNTER — Ambulatory Visit (INDEPENDENT_AMBULATORY_CARE_PROVIDER_SITE_OTHER): Payer: Medicare Other | Admitting: Physician Assistant

## 2021-12-26 VITALS — BP 118/70 | HR 90 | Temp 98.2°F | Ht 64.25 in | Wt 181.9 lb

## 2021-12-26 DIAGNOSIS — J069 Acute upper respiratory infection, unspecified: Secondary | ICD-10-CM

## 2021-12-26 MED ORDER — PREDNISONE 20 MG PO TABS: 40.0000 mg | ORAL_TABLET | Freq: Every day | ORAL | 0 refills | Status: AC

## 2021-12-26 MED ORDER — AZITHROMYCIN 250 MG PO TABS: ORAL_TABLET | ORAL | 0 refills | Status: DC

## 2021-12-26 NOTE — Patient Instructions (Addendum)
Based on your described symptoms and the duration of symptoms it is likely that you have a viral upper respiratory infection (often called a "cold")  Symptoms can last for 3-10 days with lingering cough and intermittent symptoms lasting weeks after that.  The goal of treatment at this time is to reduce your symptoms and discomfort   I have sent in a script for a Prednisone burst and Zpack- please take these as directed to assist with your symptoms  You can also continue to take Mucinex and tessalon perls to assist with your symptoms   You can use over the counter medications such as Dayquil/Nyquil, AlkaSeltzer formulations, etc to provide further relief of symptoms according to the manufacturer's instructions  If preferred you can use Coricidin to manage your symptoms rather than those medications mentioned above.   If your symptoms do not improve or become worse in the next 5-7 days please make an apt at the office so we can see you  Go to the ER if you begin to have more serious symptoms such as shortness of breath, trouble breathing, loss of consciousness, swelling around the eyes, high fever, severe lasting headaches, vision changes or neck pain/stiffness.    Please reach out to the Hospice center that was caring for your son. They usually offer grief counseling for up to a year after such a passing  If you feel like you need further help please let us know as we can send you to therapy and offer medication to assist you during this time.

## 2021-12-26 NOTE — Progress Notes (Signed)
Acute Office Visit   Patient: Samuel Morris   DOB: 09-01-1945   76 y.o. Male  MRN: [id number] Visit Date: 12/26/2021  Today's healthcare provider: Dani Gobble Shalia Bartko, PA-C  Introduced myself to the patient as a Journalist, newspaper and provided education on APPs in clinical practice.     Chief Complaint  Patient presents with   Nasal Congestion    Started about a week ago, has been taking Mucinex   sinus drainage   Sore Throat   Subjective    Sore Throat  Associated symptoms include congestion, coughing and diarrhea. Pertinent negatives include no ear pain, headaches, shortness of breath or vomiting.   HPI     Nasal Congestion    Additional comments: Started about a week ago, has been taking Mucinex      Last edited by Jerelene Redden, CMA on 12/26/2021  9:19 AM.       URI - type symptoms    Onset: gradual  Duration: ongoing for about a week  States he gets something similar every few years  Fever: no Sore throat: yes- a little hard to swallow  Nausea:no  Vomiting: no Diarrhea: yes - a few times, unsure if it is more related to diet or current illness Chest pain: No SOB: No  Productive cough: yes  Headaches: no  Dizziness:no Ear pain: no  Alleviating: not much Aggravating: nothing  Intervention: Mucinex, tessalon perls - mild relief  COVID testing at home: He has not tested for COVID at home   Result:NA  He has had to use his rescue inhaler a few times since this started but not more than once per  Feels like this is getting worse rather than improving   Reports his son just passed away about a week ago      Medications: Outpatient Medications Prior to Visit  Medication Sig   albuterol (VENTOLIN HFA) 108 (90 Base) MCG/ACT inhaler Inhale 1 puff into the lungs every 4 (four) hours as needed for wheezing or shortness of breath.   Ascorbic Acid (VITAMIN C PO) Take 100 mg by mouth daily.   aspirin EC 81 MG tablet Take 81 mg by mouth daily.   Cholecalciferol  (VITAMIN D-1000 MAX ST) 25 MCG (1000 UT) tablet Take 1,000 Units by mouth daily.   dapagliflozin propanediol (FARXIGA) 5 MG TABS tablet Take 1 tablet (5 mg total) by mouth daily.   diclofenac Sodium (VOLTAREN) 1 % GEL    ENTRESTO 49-51 MG Take 1 tablet by mouth 2 (two) times daily.   ezetimibe (ZETIA) 10 MG tablet Take 1 tablet (10 mg total) by mouth daily.   fluticasone (FLONASE) 50 MCG/ACT nasal spray Place 1 spray into both nostrils daily.   fluticasone furoate-vilanterol ([name] ELLIPTA) 100-25 MCG/ACT AEPB Inhale 1 puff into the lungs daily.   folic acid (FOLVITE) 1 MG tablet 1 tablet   furosemide (LASIX) 40 MG tablet Take 1 tablet (40 mg total) by mouth daily.   hydroxychloroquine (PLAQUENIL) 200 MG tablet Take by mouth.   isosorbide mononitrate (IMDUR) 30 MG 24 hr tablet TAKE 1/2 OF A TABLET (15 MG TOTAL) BY MOUTH DAILY   methotrexate (RHEUMATREX) 2.5 MG tablet Take 15 mg by mouth once a week.   metoprolol succinate (TOPROL-XL) 100 MG 24 hr tablet TAKE 1 AND 1/2 TABLETS BY MOUTH DAILY WITH OR IMMEDIATELY FOLLOWING A MEAL   mexiletine (MEXITIL) 150 MG capsule Take 150 mg by mouth 2 (two) times daily.  nitroGLYCERIN (NITROSTAT) 0.4 MG SL tablet Place 1 tablet (0.4 mg total) under the tongue every 5 (five) minutes as needed for chest pain.   rivaroxaban (XARELTO) 20 MG TABS tablet Take by mouth.   rosuvastatin (CRESTOR) 20 MG tablet Take 1 tablet (20 mg total) by mouth at bedtime.   spironolactone (ALDACTONE) 25 MG tablet Take 1 tablet (25 mg total) by mouth daily.   Turmeric (QC TUMERIC COMPLEX PO) Take by mouth daily. Unsure of dose   vitamin B-12 (CYANOCOBALAMIN) 1000 MCG tablet Take by mouth daily.   Calcium-Magnesium 100-50 MG TABS Take 1 tablet by mouth daily.   [DISCONTINUED] benzonatate (TESSALON) 200 MG capsule Take 1 capsule (200 mg total) by mouth 2 (two) times daily as needed for cough. (Patient not taking: Reported on 11/27/2021)   [DISCONTINUED] gabapentin (NEURONTIN) 100 MG  capsule Take 100 mg by mouth 3 (three) times daily. (Patient not taking: Reported on 11/27/2021)   [DISCONTINUED] predniSONE (DELTASONE) 10 MG tablet 6 tabs today, 5 tabs tomorrow, decrease by 1 daily until gone   No facility-administered medications prior to visit.    Review of Systems  Constitutional:  Positive for chills. Negative for fatigue and fever.  HENT:  Positive for congestion and sore throat. Negative for ear pain, rhinorrhea, sinus pressure and sinus pain.   Respiratory:  Positive for cough and chest tightness. Negative for shortness of breath and wheezing.   Cardiovascular:  Negative for chest pain.  Gastrointestinal:  Positive for diarrhea. Negative for nausea and vomiting.  Musculoskeletal:  Positive for myalgias.  Neurological:  Negative for dizziness and headaches.       Objective    BP 118/70   Pulse 90   Temp 98.2 F (36.8 C) (Oral)   Ht 5' 4.25" (1.632 m)   Wt 181 lb 14.4 oz (82.5 kg)   SpO2 95%   BMI 30.98 kg/m    Physical Exam Vitals reviewed.  Constitutional:      General: He is awake.     Appearance: Normal appearance. He is well-developed and well-groomed.  HENT:     Head: Normocephalic and atraumatic.     Right Ear: Tympanic membrane, ear canal and external ear normal.     Left Ear: Tympanic membrane, ear canal and external ear normal.     Mouth/Throat:     Lips: Pink.     Pharynx: Uvula midline. Posterior oropharyngeal erythema present. No pharyngeal swelling, oropharyngeal exudate or uvula swelling.     Tonsils: No tonsillar exudate or tonsillar abscesses.  Cardiovascular:     Rate and Rhythm: Normal rate and regular rhythm.     Pulses: Normal pulses.          Radial pulses are 2+ on the right side and 2+ on the left side.     Heart sounds: Normal heart sounds. No murmur heard.    No friction rub. No gallop.  Pulmonary:     Effort: Pulmonary effort is normal.     Breath sounds: Normal breath sounds. No transmitted upper airway sounds. No  decreased breath sounds, wheezing, rhonchi or rales.  Musculoskeletal:     Cervical back: Normal range of motion.     Right lower leg: 1+ Pitting Edema present.     Left lower leg: 2+ Pitting Edema present.  Lymphadenopathy:     Head:     Right side of head: No submental, submandibular or preauricular adenopathy.     Left side of head: No submental, submandibular or preauricular adenopathy.  Cervical:     Right cervical: No superficial or posterior cervical adenopathy.    Left cervical: No superficial or posterior cervical adenopathy.     Upper Body:     Right upper body: No supraclavicular adenopathy.     Left upper body: No supraclavicular adenopathy.  Neurological:     Mental Status: He is alert.  Psychiatric:        Attention and Perception: Attention and perception normal.        Mood and Affect: Mood and affect normal.        Speech: Speech normal.        Behavior: Behavior normal. Behavior is cooperative.       No results found for any visits on 12/26/21.  Assessment & Plan      No follow-ups on file.   Problem List Items Addressed This Visit   None Visit Diagnoses     Upper respiratory tract infection, unspecified type    -  Primary Visit with patient indicates symptoms comprised of sore throat, diarrhea, productive cough for about a week congruent with acute URI  Patient has not tested for COVID at home  Patient is outside therapeutic window for antivirals- no flu or COVID testing performed today.  Due to nature and duration of symptoms recommended treatment regimen is symptomatic relief and follow up if needed Will provide a Zpack and Prednisone today given ongoing symptoms and his hx of interstitial lung disease Discussed with patient the various viral and bacterial etiologies of current illness and appropriate course of treatment Discussed OTC medication options for multisymptom relief such as Dayquil/Nyquil, Theraflu, AlkaSeltzer, etc. Per preference   Discussed return precautions if symptoms are not improving or worsen over next 5-7 days.    Relevant Medications   azithromycin (ZITHROMAX) 250 MG tablet   predniSONE (DELTASONE) 20 MG tablet        No follow-ups on file.   I, Gagandeep Kossman E Mathan Darroch, PA-C, have reviewed all documentation for this visit. The documentation on 12/26/21 for the exam, diagnosis, procedures, and orders are all accurate and complete.   Talitha Givens, MHS, PA-C [facility]

## 2022-01-01 ENCOUNTER — Encounter: Payer: Self-pay | Admitting: Nurse Practitioner

## 2022-01-01 ENCOUNTER — Ambulatory Visit (INDEPENDENT_AMBULATORY_CARE_PROVIDER_SITE_OTHER): Payer: Medicare Other | Admitting: Nurse Practitioner

## 2022-01-01 ENCOUNTER — Telehealth: Payer: Self-pay | Admitting: Family Medicine

## 2022-01-01 ENCOUNTER — Other Ambulatory Visit: Payer: Medicare Other

## 2022-01-01 VITALS — BP 139/76 | HR 97 | Temp 97.9°F | Wt 181.4 lb

## 2022-01-01 DIAGNOSIS — R051 Acute cough: Secondary | ICD-10-CM | POA: Diagnosis not present

## 2022-01-01 DIAGNOSIS — M069 Rheumatoid arthritis, unspecified: Secondary | ICD-10-CM | POA: Diagnosis not present

## 2022-01-01 MED ORDER — BENZONATATE 100 MG PO CAPS: 100.0000 mg | ORAL_CAPSULE | Freq: Three times a day (TID) | ORAL | 1 refills | Status: DC | PRN

## 2022-01-01 MED ORDER — ALBUTEROL SULFATE HFA 108 (90 BASE) MCG/ACT IN AERS: 1.0000 | INHALATION_SPRAY | RESPIRATORY_TRACT | 3 refills | Status: DC | PRN

## 2022-01-01 MED ORDER — METHOTREXATE SODIUM 2.5 MG PO TABS: ORAL_TABLET | ORAL | 1 refills | Status: AC

## 2022-01-01 MED ORDER — AMOXICILLIN-POT CLAVULANATE 875-125 MG PO TABS: 1.0000 | ORAL_TABLET | Freq: Two times a day (BID) | ORAL | 0 refills | Status: AC

## 2022-01-01 MED ORDER — HYDROXYCHLOROQUINE SULFATE 200 MG PO TABS: 400.0000 mg | ORAL_TABLET | Freq: Every day | ORAL | 1 refills | Status: AC

## 2022-01-01 NOTE — Telephone Encounter (Signed)
PT dropped off two forms the [place] Patient Assistance Program Non-Vaccine Application and [employer] Patient Assistance Foundation to be filled out.  Placed in provider's folder.

## 2022-01-01 NOTE — Assessment & Plan Note (Signed)
Chronic, ongoing with no current rheumatologist -- place new referral today to area of his request.  Refills on treatments sent for short period to cover while waiting to see new provider.

## 2022-01-01 NOTE — Progress Notes (Signed)
BP 139/76   Pulse 97   Temp 97.9 F (36.6 C) (Oral)   Wt 181 lb 6.4 oz (82.3 kg)   SpO2 93%   BMI 30.89 kg/m    Subjective:    Patient ID: [name], male    DOB: September 02, 1945, 76 y.o.   MRN: [id number]  HPI: Yul Diana is a 76 y.o. male  Chief Complaint  Patient presents with   URI    Pt states he is still having congestion and a cough. States he finished the Z pack that was prescribed last week and did not feel much better. States he feels like it has moved into his chest.    Referral    Pt states he needs a new referral to rheumatology, would like to go to [facility] new referral to rheumatology, as they shut down current practice -- would like to go to [facility] -- Dr. Santiago Glad.  Currently out of Methotrexate 15 MG weekly and Plaquenil 400 MG daily.  UPPER RESPIRATORY TRACT INFECTION Was seen on 12/26/21 for this and treated with Zpack and Prednisone for 5 days.  Continues to have cough, more noted in evening.  Can sleep for a couple hours sitting up.  No Covid testing done.  Followed by cardiology at [facility] 10/24/21.  He gets bronchitis every few years.  Denies abdominal swelling.  Has swelling in ankles, showed cardiology recently and they increased Lasix for 2 days.   Worst symptom: cough Fever: no Cough: yes Shortness of breath: does get winded with walking, but this is baseline -- EF 30% Wheezing: occasional Chest pain: no Chest tightness: yes Chest congestion: no Nasal congestion: no Runny nose: yes Post nasal drip: yes Sneezing: no Sore throat: no Swollen glands: no Sinus pressure: no Headache: no Face pain: no Toothache: no Ear pain: none Ear pressure: none Eyes red/itching:no Eye drainage/crusting: no  Vomiting: no Rash: no Fatigue: yes Sick contacts: no Strep contacts: no  Context: fluctuating Recurrent sinusitis: no Relief with OTC cold/cough medications: yes  Treatments attempted: Zpack and Prednisone + Mucinex     Relevant past medical, surgical, family and social history reviewed and updated as indicated. Interim medical history since our last visit reviewed. Allergies and medications reviewed and updated.  Review of Systems  Constitutional:  Positive for fatigue. Negative for activity change, appetite change, diaphoresis and fever.  HENT:  Positive for postnasal drip and rhinorrhea. Negative for congestion, ear discharge, ear pain, sinus pressure, sinus pain, sneezing, sore throat and voice change.   Respiratory:  Positive for cough, chest tightness, shortness of breath (at baseline, no worsening) and wheezing.   Cardiovascular:  Positive for leg swelling. Negative for chest pain and palpitations.  Gastrointestinal: Negative.   Neurological: Negative.   Psychiatric/Behavioral: Negative.      Per HPI unless specifically indicated above     Objective:    BP 139/76   Pulse 97   Temp 97.9 F (36.6 C) (Oral)   Wt 181 lb 6.4 oz (82.3 kg)   SpO2 93%   BMI 30.89 kg/m   Wt Readings from Last 3 Encounters:  01/01/22 181 lb 6.4 oz (82.3 kg)  12/26/21 181 lb 14.4 oz (82.5 kg)  11/27/21 178 lb 9.6 oz (81 kg)    Physical Exam Vitals and nursing note reviewed.  Constitutional:      General: He is awake. He is not in acute distress.    Appearance: He is well-developed and well-groomed. He is obese.  He is not ill-appearing or toxic-appearing.  HENT:     Head: Normocephalic and atraumatic.     Right Ear: Hearing, tympanic membrane, ear canal and external ear normal. No drainage.     Left Ear: Hearing, tympanic membrane, ear canal and external ear normal. No drainage.     Nose: Rhinorrhea present. Rhinorrhea is clear.     Right Sinus: No maxillary sinus tenderness or frontal sinus tenderness.     Left Sinus: No maxillary sinus tenderness or frontal sinus tenderness.     Mouth/Throat:     Mouth: Mucous membranes are moist.     Pharynx: Posterior oropharyngeal erythema (mild cobblestone pattern)  present. No pharyngeal swelling or oropharyngeal exudate.  Eyes:     General: Lids are normal.        Right eye: No discharge.        Left eye: No discharge.     Conjunctiva/sclera: Conjunctivae normal.     Pupils: Pupils are equal, round, and reactive to light.  Neck:     Thyroid: No thyromegaly.     Vascular: No carotid bruit or JVD.     Trachea: Trachea normal.  Cardiovascular:     Rate and Rhythm: Normal rate and regular rhythm.     Heart sounds: Normal heart sounds, S1 normal and S2 normal. No murmur heard.    No gallop.  Pulmonary:     Effort: Pulmonary effort is normal. No accessory muscle usage or respiratory distress.     Breath sounds: Wheezing and rhonchi present. No decreased breath sounds.     Comments: Occasional expiratory wheezes noted throughout.  Coarse rhonchi noted to right upper and left lower base, clears with cough.  No SOB with talking or walking. Abdominal:     General: Bowel sounds are normal. There is no distension.     Palpations: Abdomen is soft.  Musculoskeletal:        General: Normal range of motion.     Cervical back: Normal range of motion and neck supple.     Right lower leg: 1+ Edema present.     Left lower leg: 1+ Edema present.  Lymphadenopathy:     Head:     Right side of head: No submental, submandibular, tonsillar, preauricular or posterior auricular adenopathy.     Left side of head: No submental, submandibular, tonsillar, preauricular or posterior auricular adenopathy.  Skin:    General: Skin is warm and dry.     Capillary Refill: Capillary refill takes less than 2 seconds.     Findings: No rash.  Neurological:     Mental Status: He is alert and oriented to person, place, and time.  Psychiatric:        Attention and Perception: Attention normal.        Mood and Affect: Mood normal.        Speech: Speech normal.        Behavior: Behavior normal. Behavior is cooperative.        Thought Content: Thought content normal.     Results  for orders placed or performed in visit on 11/27/21  Comprehensive metabolic panel  Result Value Ref Range   Glucose 105 (H) 70 - 99 mg/dL   BUN 14 8 - 27 mg/dL   Creatinine, Ser 0.89 0.76 - 1.27 mg/dL   eGFR 88 >59 mL/min/1.73   BUN/Creatinine Ratio 16 10 - 24   Sodium 138 134 - 144 mmol/L   Potassium 4.4 3.5 - 5.2 mmol/L  Chloride 99 96 - 106 mmol/L   CO2 20 20 - 29 mmol/L   Calcium 9.3 8.6 - 10.2 mg/dL   Total Protein 6.5 6.0 - 8.5 g/dL   Albumin 3.8 3.8 - 4.8 g/dL   Globulin, Total 2.7 1.5 - 4.5 g/dL   Albumin/Globulin Ratio 1.4 1.2 - 2.2   Bilirubin Total 0.3 0.0 - 1.2 mg/dL   Alkaline Phosphatase 117 44 - 121 IU/L   AST 18 0 - 40 IU/L   ALT 23 0 - 44 IU/L  CBC with Differential/Platelet  Result Value Ref Range   WBC 8.5 3.4 - 10.8 x10E3/uL   RBC 4.91 4.14 - 5.80 x10E6/uL   Hemoglobin 14.5 13.0 - 17.7 g/dL   Hematocrit 42.6 37.5 - 51.0 %   MCV 87 79 - 97 fL   MCH 29.5 26.6 - 33.0 pg   MCHC 34.0 31.5 - 35.7 g/dL   RDW 13.3 11.6 - 15.4 %   Platelets 304 150 - 450 x10E3/uL   Neutrophils 70 Not Estab. %   Lymphs 13 Not Estab. %   Monocytes 11 Not Estab. %   Eos 4 Not Estab. %   Basos 1 Not Estab. %   Neutrophils Absolute 6.1 1.4 - 7.0 x10E3/uL   Lymphocytes Absolute 1.1 0.7 - 3.1 x10E3/uL   Monocytes Absolute 0.9 0.1 - 0.9 x10E3/uL   EOS (ABSOLUTE) 0.3 0.0 - 0.4 x10E3/uL   Basophils Absolute 0.1 0.0 - 0.2 x10E3/uL   Immature Granulocytes 1 Not Estab. %   Immature Grans (Abs) 0.1 0.0 - 0.1 x10E3/uL  Lipid Panel w/o Chol/HDL Ratio  Result Value Ref Range   Cholesterol, Total 163 100 - 199 mg/dL   Triglycerides 135 0 - 149 mg/dL   HDL 41 >39 mg/dL   VLDL Cholesterol Cal 24 5 - 40 mg/dL   LDL Chol Calc (NIH) 98 0 - 99 mg/dL  TSH  Result Value Ref Range   TSH 4.520 (H) 0.450 - 4.500 uIU/mL  Urinalysis, Routine w reflex microscopic  Result Value Ref Range   Specific Gravity, UA >1.030 (H) 1.005 - 1.030   pH, UA 5.5 5.0 - 7.5   Color, UA Yellow Yellow    Appearance Ur Clear Clear   Leukocytes,UA Negative Negative   Protein,UA Trace (A) Negative/Trace   Glucose, UA 2+ (A) Negative   Ketones, UA Negative Negative   RBC, UA Negative Negative   Bilirubin, UA Negative Negative   Urobilinogen, Ur 0.2 0.2 - 1.0 mg/dL   Nitrite, UA Negative Negative  Microalbumin, Urine Waived  Result Value Ref Range   Microalb, Ur Waived 80 (H) 0 - 19 mg/L   Creatinine, Urine Waived 100 10 - 300 mg/dL   Microalb/Creat Ratio 30-300 (H) <30 mg/g  Bayer DCA Hb A1c Waived  Result Value Ref Range   HB A1C (BAYER DCA - WAIVED) 6.4 (H) 4.8 - 5.6 %      Assessment & Plan:   Problem List Items Addressed This Visit       Musculoskeletal and Integument   Rheumatoid arthritis (HCC) - Primary    Chronic, ongoing with no current rheumatologist -- place new referral today to area of his request.  Refills on treatments sent for short period to cover while waiting to see new provider.      Relevant Medications   methotrexate (RHEUMATREX) 2.5 MG tablet   hydroxychloroquine (PLAQUENIL) 200 MG tablet   Other Relevant Orders   Ambulatory referral to Rheumatology     Other  Acute cough    Acute and ongoing since 12/26/21 with minimal response to Zpack and Prednisone.  Has seen cardiology since that time, he reports no concern for heart relation to cough, although was advised to take a few extra days Lasix for edema legs.  At this time will obtain CXR to further assess for PNA vs fluid overload with HF.  Start Augmentin BID for 7 days.  Refill on Albuterol and Tessalon sent.  Determine next steps upon return of imaging.  Return in 1 week.      Relevant Orders   DG Chest 2 View     Follow up plan: Return in about 1 week (around 01/08/2022) for Cough.

## 2022-01-01 NOTE — Assessment & Plan Note (Signed)
Acute and ongoing since 12/26/21 with minimal response to Zpack and Prednisone.  Has seen cardiology since that time, he reports no concern for heart relation to cough, although was advised to take a few extra days Lasix for edema legs.  At this time will obtain CXR to further assess for PNA vs fluid overload with HF.  Start Augmentin BID for 7 days.  Refill on Albuterol and Tessalon sent.  Determine next steps upon return of imaging.  Return in 1 week.

## 2022-01-01 NOTE — Patient Instructions (Addendum)
Coricidin over the counter for cough Get chest x-ray at [facility] = Address: [address] [place], [place] [address] Hours:8 am to 5 pm Phone: [phone]  Cough, Adult A cough helps to clear your throat and lungs. A cough may be a sign of an illness or another medical condition. An acute cough may only last 2-3 weeks, while a chronic cough may last 8 or more weeks. Many things can cause a cough. They include: Germs (viruses or bacteria) that attack the airway. Breathing in things that bother (irritate) your lungs. Allergies. Asthma. Mucus that runs down the back of your throat (postnasal drip). Smoking. Acid backing up from the stomach into the tube that moves food from the mouth to the stomach (gastroesophageal reflux). Some medicines. Lung problems. Other medical conditions, such as heart failure or a blood clot in the lung (pulmonary embolism). Follow these instructions at home: Medicines Take over-the-counter and prescription medicines only as told by your doctor. Talk with your doctor before you take medicines that stop a cough (cough suppressants). Lifestyle  Do not smoke, and try not to be around smoke. Do not use any products that contain nicotine or tobacco, such as cigarettes, e-cigarettes, and chewing tobacco. If you need help quitting, ask your doctor. Drink enough fluid to keep your pee (urine) pale yellow. Avoid caffeine. Do not drink alcohol if your doctor tells you not to drink. General instructions  Watch for any changes in your cough. Tell your doctor about them. Always cover your mouth when you cough. Stay away from things that make you cough, such as perfume, candles, campfire smoke, or cleaning products. If the air is dry, use a cool mist vaporizer or humidifier in your home. If your cough is worse at night, try using extra pillows to raise your head up higher while you sleep. Rest as needed. Keep all follow-up visits as told by your doctor. This is  important. Contact a doctor if: You have new symptoms. You cough up pus. Your cough does not get better after 2-3 weeks, or your cough gets worse. Cough medicine does not help your cough and you are not sleeping well. You have pain that gets worse or pain that is not helped with medicine. You have a fever. You are losing weight and you do not know why. You have night sweats. Get help right away if: You cough up blood. You have trouble breathing. Your heartbeat is very fast. These symptoms may be an emergency. Do not wait to see if the symptoms will go away. Get medical help right away. Call your local emergency services (911 in the [place] Do not drive yourself to the hospital. Summary A cough helps to clear your throat and lungs. Many things can cause a cough. Take over-the-counter and prescription medicines only as told by your doctor. Always cover your mouth when you cough. Contact a doctor if you have new symptoms or you have a cough that does not get better or gets worse. This information is not intended to replace advice given to you by your health care provider. Make sure you discuss any questions you have with your health care provider. Document Revised: 04/17/2019 Document Reviewed: 03/17/2018 Elsevier Patient Education  [place].

## 2022-01-02 ENCOUNTER — Ambulatory Visit
Admission: RE | Admit: 2022-01-02 | Discharge: 2022-01-02 | Disposition: A | Discharge status: Home / Self Care | Payer: Medicare Other | Source: Home / Self Care | Attending: *Deleted | Admitting: *Deleted

## 2022-01-02 ENCOUNTER — Other Ambulatory Visit: Payer: Medicare Other

## 2022-01-02 ENCOUNTER — Ambulatory Visit
Admission: RE | Admit: 2022-01-02 | Discharge: 2022-01-02 | Disposition: A | Discharge status: Home / Self Care | Payer: Medicare Other | Source: Ambulatory Visit | Attending: Nurse Practitioner | Admitting: Nurse Practitioner

## 2022-01-02 DIAGNOSIS — E039 Hypothyroidism, unspecified: Secondary | ICD-10-CM

## 2022-01-02 DIAGNOSIS — R051 Acute cough: Secondary | ICD-10-CM | POA: Diagnosis present

## 2022-01-02 NOTE — Progress Notes (Signed)
Please let patient know his imaging returned and no pneumonia seen, but I am concerned for fluid overload due to heart.  I would recommend increase Lasix by 20 MG daily for 4 days (1/2 of your 40 MG tablet) then return to regular dosing.  Continue current antibiotic I sent in until complete as well.  If any worsening cough or shortness or breath alert [place] immediately.  Any questions?

## 2022-01-03 ENCOUNTER — Encounter: Payer: Self-pay | Admitting: Family Medicine

## 2022-01-03 DIAGNOSIS — E038 Other specified hypothyroidism: Secondary | ICD-10-CM | POA: Insufficient documentation

## 2022-01-03 LAB — TSH: TSH: 4.59 u[IU]/mL — ABNORMAL HIGH (ref 0.450–4.500)

## 2022-01-03 NOTE — Telephone Encounter (Signed)
Pharmacy team notified via teams 

## 2022-01-03 NOTE — Telephone Encounter (Signed)
Forms have been signed by provider and placed in yellow folder.

## 2022-01-09 DIAGNOSIS — I10 Essential (primary) hypertension: Secondary | ICD-10-CM

## 2022-01-09 DIAGNOSIS — J449 Chronic obstructive pulmonary disease, unspecified: Secondary | ICD-10-CM | POA: Diagnosis not present

## 2022-01-09 DIAGNOSIS — E1159 Type 2 diabetes mellitus with other circulatory complications: Secondary | ICD-10-CM | POA: Diagnosis not present

## 2022-01-09 DIAGNOSIS — E785 Hyperlipidemia, unspecified: Secondary | ICD-10-CM | POA: Diagnosis not present

## 2022-01-11 ENCOUNTER — Encounter: Payer: Self-pay | Admitting: Family Medicine

## 2022-01-11 ENCOUNTER — Ambulatory Visit (INDEPENDENT_AMBULATORY_CARE_PROVIDER_SITE_OTHER): Payer: Medicare Other | Admitting: Family Medicine

## 2022-01-11 VITALS — BP 111/63 | HR 63 | Temp 97.8°F | Wt 180.0 lb

## 2022-01-11 DIAGNOSIS — I255 Ischemic cardiomyopathy: Secondary | ICD-10-CM | POA: Diagnosis not present

## 2022-01-11 DIAGNOSIS — I509 Heart failure, unspecified: Secondary | ICD-10-CM | POA: Diagnosis not present

## 2022-01-11 NOTE — Progress Notes (Signed)
BP 111/63   Pulse 63   Temp 97.8 F (36.6 C)   Wt 180 lb (81.6 kg)   SpO2 97%   BMI 30.66 kg/m    Subjective:    Patient ID: Samuel Morris, male    DOB: October 09, 1945, 76 y.o.   MRN: [id number]  HPI: Samuel Morris is a 76 y.o. male  Chief Complaint  Patient presents with   Cough    Patient here to follow up on cough, states cough is better    Leg Swelling    Patient states he is still having leg swelling.    Has been taking '40mg'$  of lasix a day for about a week, still having issues with swelling. He notes that his legs have been getting less swollen. Has not been using compression hose. He had been on '40mg'$  of lasix, but was dropped down to '20mg'$  by cardiology due to complaints about urinating too much. His cough is resolved and he feels like his breathing is better. No other concerns or complaints at this time.   Relevant past medical, surgical, family and social history reviewed and updated as indicated. Interim medical history since our last visit reviewed. Allergies and medications reviewed and updated.  Review of Systems  Constitutional: Negative.   Respiratory: Negative.    Cardiovascular:  Positive for leg swelling. Negative for chest pain and palpitations.  Gastrointestinal: Negative.   Musculoskeletal: Negative.   Neurological: Negative.   Psychiatric/Behavioral: Negative.      Per HPI unless specifically indicated above     Objective:    BP 111/63   Pulse 63   Temp 97.8 F (36.6 C)   Wt 180 lb (81.6 kg)   SpO2 97%   BMI 30.66 kg/m   Wt Readings from Last 3 Encounters:  01/11/22 180 lb (81.6 kg)  01/01/22 181 lb 6.4 oz (82.3 kg)  12/26/21 181 lb 14.4 oz (82.5 kg)    Physical Exam Vitals and nursing note reviewed.  Constitutional:      General: He is not in acute distress.    Appearance: Normal appearance. He is well-developed. He is obese.  HENT:     Head: Normocephalic and atraumatic.     Right Ear: Hearing and external ear normal.     Left Ear:  Hearing and external ear normal.     Nose: Nose normal.     Mouth/Throat:     Mouth: Mucous membranes are moist.     Pharynx: Oropharynx is clear.  Eyes:     General: Lids are normal. No scleral icterus.       Right eye: No discharge.        Left eye: No discharge.     Conjunctiva/sclera: Conjunctivae normal.  Cardiovascular:     Rate and Rhythm: Normal rate and regular rhythm.     Pulses: Normal pulses.     Heart sounds: Normal heart sounds. No murmur heard.    No friction rub. No gallop.  Pulmonary:     Effort: Pulmonary effort is normal. No respiratory distress.     Breath sounds: Normal breath sounds. No stridor. No wheezing, rhonchi or rales.  Chest:     Chest wall: No tenderness.  Musculoskeletal:        General: Normal range of motion.     Right lower leg: Edema (2+) present.     Left lower leg: Edema (3+) present.  Skin:    Coloration: Skin is not jaundiced or pale.     Findings: No  bruising, erythema, lesion or rash.  Neurological:     Mental Status: He is alert. Mental status is at baseline. He is disoriented.  Psychiatric:        Mood and Affect: Mood normal.        Speech: Speech normal.        Behavior: Behavior normal.        Thought Content: Thought content normal.        Judgment: Judgment normal.     Results for orders placed or performed in visit on 01/02/22  TSH  Result Value Ref Range   TSH 4.590 (H) 0.450 - 4.500 uIU/mL      Assessment & Plan:   Problem List Items Addressed This Visit       Cardiovascular and Mediastinum   Chronic congestive heart failure ([place]) - Primary    Will increase him to '60mg'$  on his lasix for the next 4 days and follow up on Monday. Likely will need to go back on his '40mg'$  of lasix daily. Continue to monitor. Call with any concerns.       Relevant Orders   B Nat Peptide   Basic metabolic panel     Follow up plan: Return Monday for follow up, OK to double book if need be.

## 2022-01-11 NOTE — Assessment & Plan Note (Signed)
Will increase him to '60mg'$  on his lasix for the next 4 days and follow up on Monday. Likely will need to go back on his '40mg'$  of lasix daily. Continue to monitor. Call with any concerns.

## 2022-01-13 LAB — BASIC METABOLIC PANEL
BUN/Creatinine Ratio: 20 (ref 10–24)
BUN: 15 mg/dL (ref 8–27)
CO2: 24 mmol/L (ref 20–29)
Calcium: 9 mg/dL (ref 8.6–10.2)
Chloride: 100 mmol/L (ref 96–106)
Creatinine, Ser: 0.75 mg/dL — ABNORMAL LOW (ref 0.76–1.27)
Glucose: 124 mg/dL — ABNORMAL HIGH (ref 70–99)
Potassium: 4.4 mmol/L (ref 3.5–5.2)
Sodium: 139 mmol/L (ref 134–144)
eGFR: 93 mL/min/{1.73_m2} (ref >59)

## 2022-01-13 LAB — BRAIN NATRIURETIC PEPTIDE: BNP: 293.7 pg/mL — ABNORMAL HIGH (ref 0.0–100.0)

## 2022-01-15 ENCOUNTER — Ambulatory Visit (INDEPENDENT_AMBULATORY_CARE_PROVIDER_SITE_OTHER): Payer: Medicare Other | Admitting: Family Medicine

## 2022-01-15 ENCOUNTER — Encounter: Payer: Self-pay | Admitting: Family Medicine

## 2022-01-15 VITALS — BP 107/70 | HR 87 | Temp 97.8°F | Wt 173.9 lb

## 2022-01-15 DIAGNOSIS — I255 Ischemic cardiomyopathy: Secondary | ICD-10-CM

## 2022-01-15 DIAGNOSIS — I509 Heart failure, unspecified: Secondary | ICD-10-CM

## 2022-01-15 NOTE — Progress Notes (Signed)
BP 107/70   Pulse 87   Temp 97.8 F (36.6 C)   Wt 173 lb 14.4 oz (78.9 kg)   SpO2 98%   BMI 29.62 kg/m    Subjective:    Patient ID: Samuel Morris, male    DOB: 1945-11-23, 76 y.o.   MRN: [id number]  HPI: Samuel Morris is a 76 y.o. male  Chief Complaint  Patient presents with   Chronic congestive heart failure    Lasix increased to 50m last week. Per patient right leg is almost completely better, left leg is still swollen.    Cough    Patient states cough is coming back   Has been feeling better since upping his dose of lasix. His breathing has been better and his legs are significantly less swollen. No problems with pain. No redness. Has had a cough, but has not started his breo yet. No other concerns or complaints at this time.   Relevant past medical, surgical, family and social history reviewed and updated as indicated. Interim medical history since our last visit reviewed. Allergies and medications reviewed and updated.  Review of Systems  Constitutional: Negative.   Respiratory: Negative.    Cardiovascular:  Positive for leg swelling. Negative for chest pain and palpitations.  Gastrointestinal: Negative.   Musculoskeletal: Negative.   Neurological: Negative.   Psychiatric/Behavioral: Negative.      Per HPI unless specifically indicated above     Objective:    BP 107/70   Pulse 87   Temp 97.8 F (36.6 C)   Wt 173 lb 14.4 oz (78.9 kg)   SpO2 98%   BMI 29.62 kg/m   Wt Readings from Last 3 Encounters:  01/15/22 173 lb 14.4 oz (78.9 kg)  01/11/22 180 lb (81.6 kg)  01/01/22 181 lb 6.4 oz (82.3 kg)    Physical Exam Vitals and nursing note reviewed.  Constitutional:      General: He is not in acute distress.    Appearance: Normal appearance. He is not ill-appearing, toxic-appearing or diaphoretic.  HENT:     Head: Normocephalic and atraumatic.     Right Ear: External ear normal.     Left Ear: External ear normal.     Nose: Nose normal.     Mouth/Throat:      Mouth: Mucous membranes are moist.     Pharynx: Oropharynx is clear.  Eyes:     General: No scleral icterus.       Right eye: No discharge.        Left eye: No discharge.     Extraocular Movements: Extraocular movements intact.     Conjunctiva/sclera: Conjunctivae normal.     Pupils: Pupils are equal, round, and reactive to light.  Cardiovascular:     Rate and Rhythm: Normal rate and regular rhythm.     Pulses: Normal pulses.     Heart sounds: Normal heart sounds. No murmur heard.    No friction rub. No gallop.  Pulmonary:     Effort: Pulmonary effort is normal. No respiratory distress.     Breath sounds: Normal breath sounds. No stridor. No wheezing, rhonchi or rales.  Chest:     Chest wall: No tenderness.  Musculoskeletal:        General: Normal range of motion.     Cervical back: Normal range of motion and neck supple.     Right lower leg: Edema (trace) present.     Left lower leg: Edema (1+) present.  Skin:    General:  Skin is warm and dry.     Capillary Refill: Capillary refill takes less than 2 seconds.     Coloration: Skin is not jaundiced or pale.     Findings: No bruising, erythema, lesion or rash.  Neurological:     General: No focal deficit present.     Mental Status: He is alert and oriented to person, place, and time. Mental status is at baseline.  Psychiatric:        Mood and Affect: Mood normal.        Behavior: Behavior normal.        Thought Content: Thought content normal.        Judgment: Judgment normal.     Results for orders placed or performed in visit on 01/11/22  B Nat Peptide  Result Value Ref Range   BNP 293.7 (H) 0.0 - 100.0 pg/mL  Basic metabolic panel  Result Value Ref Range   Glucose 124 (H) 70 - 99 mg/dL   BUN 15 8 - 27 mg/dL   Creatinine, Ser 0.75 (L) 0.76 - 1.27 mg/dL   eGFR 93 >59 mL/min/1.73   BUN/Creatinine Ratio 20 10 - 24   Sodium 139 134 - 144 mmol/L   Potassium 4.4 3.5 - 5.2 mmol/L   Chloride 100 96 - 106 mmol/L   CO2  24 20 - 29 mmol/L   Calcium 9.0 8.6 - 10.2 mg/dL      Assessment & Plan:   Problem List Items Addressed This Visit       Cardiovascular and Mediastinum   Chronic congestive heart failure ([place]) - Primary    Significantly better with only trace edema. Continue 28m lasix. Recheck 1 month with BMP. Call with any concerns. Continue to follow with cardiology.        Follow up plan: Return in about 4 weeks (around 02/12/2022).

## 2022-01-15 NOTE — Assessment & Plan Note (Signed)
Significantly better with only trace edema. Continue '40mg'$  lasix. Recheck 1 month with BMP. Call with any concerns. Continue to follow with cardiology.

## 2022-01-25 ENCOUNTER — Other Ambulatory Visit: Payer: Self-pay | Admitting: Family Medicine

## 2022-01-25 NOTE — Telephone Encounter (Signed)
Refused Aldactone 25 mg tablet because being requested too soon.

## 2022-02-09 ENCOUNTER — Ambulatory Visit (INDEPENDENT_AMBULATORY_CARE_PROVIDER_SITE_OTHER): Payer: Medicare Other | Admitting: Family Medicine

## 2022-02-09 ENCOUNTER — Encounter: Payer: Self-pay | Admitting: Family Medicine

## 2022-02-09 VITALS — BP 110/73 | HR 86 | Temp 97.9°F | Ht 64.0 in | Wt 176.3 lb

## 2022-02-09 DIAGNOSIS — Z23 Encounter for immunization: Secondary | ICD-10-CM

## 2022-02-09 DIAGNOSIS — J069 Acute upper respiratory infection, unspecified: Secondary | ICD-10-CM | POA: Diagnosis not present

## 2022-02-09 DIAGNOSIS — I255 Ischemic cardiomyopathy: Secondary | ICD-10-CM

## 2022-02-09 DIAGNOSIS — I509 Heart failure, unspecified: Secondary | ICD-10-CM | POA: Diagnosis not present

## 2022-02-09 MED ORDER — PREDNISONE 50 MG PO TABS: 50.0000 mg | ORAL_TABLET | Freq: Every day | ORAL | 0 refills | Status: DC

## 2022-02-09 NOTE — Assessment & Plan Note (Signed)
Doing well. Euvolemic today. Continue current regimen. Call with any concerns.

## 2022-02-09 NOTE — Progress Notes (Signed)
BP 110/73   Pulse 86   Temp 97.9 F (36.6 C) (Oral)   Ht _0  (1.626 m)   Wt 176 lb 4.8 oz (80 kg)   SpO2 98%   BMI 30.26 kg/m    Subjective:    Patient ID: Samuel Morris, male    DOB: [birth year], 76 y.o.   MRN: [id number]  HPI: Samuel Morris is a 76 y.o. male  Chief Complaint  Patient presents with   Congestive Heart Failure   Cough    Patient says he has been dealing with a for a while and he has been treated for it and says he does not think it cleared up completely. Patient says he thinks he grandson may have brought something back home from school.    Has been doing well with his 49m lasix. Has followed up with cardiology who was pleased with how he was doing. No SOB. No swelling. Does have a cough.   UPPER RESPIRATORY TRACT INFECTION Duration: couple of days Worst symptom: cough Fever: no Cough: yes Shortness of breath: no Wheezing: yes Chest pain: no Chest tightness: no Chest congestion: no Nasal congestion: yes Runny nose: yes Post nasal drip: no Sneezing: no Sore throat: yes Swollen glands: no Sinus pressure: no Headache: no Face pain: no Toothache: no Ear pain: no  Ear pressure: no  Eyes red/itching:no Eye drainage/crusting: no  Vomiting: no Rash: no Fatigue: yes Sick contacts: yes Strep contacts: no  Context: worse Recurrent sinusitis: no Relief with OTC cold/cough medications: no  Treatments attempted: chloradine, inhaler, nasal spray   Relevant past medical, surgical, family and social history reviewed and updated as indicated. Interim medical history since our last visit reviewed. Allergies and medications reviewed and updated.  Review of Systems  Constitutional: Negative.   Respiratory:  Positive for cough and wheezing. Negative for apnea, choking, chest tightness, shortness of breath and stridor.   Cardiovascular: Negative.   Gastrointestinal: Negative.   Musculoskeletal: Negative.   Neurological: Negative.   Psychiatric/Behavioral:  Negative.      Per HPI unless specifically indicated above     Objective:    BP 110/73   Pulse 86   Temp 97.9 F (36.6 C) (Oral)   Ht _1  (1.626 m)   Wt 176 lb 4.8 oz (80 kg)   SpO2 98%   BMI 30.26 kg/m   Wt Readings from Last 3 Encounters:  02/09/22 176 lb 4.8 oz (80 kg)  01/15/22 173 lb 14.4 oz (78.9 kg)  01/11/22 180 lb (81.6 kg)    Physical Exam Vitals and nursing note reviewed.  Constitutional:      General: He is not in acute distress.    Appearance: Normal appearance. He is well-developed. He is not ill-appearing, toxic-appearing or diaphoretic.  HENT:     Head: Normocephalic and atraumatic.     Right Ear: Tympanic membrane, ear canal and external ear normal.     Left Ear: Tympanic membrane, ear canal and external ear normal.     Nose: Nose normal. No congestion or rhinorrhea.     Mouth/Throat:     Mouth: Mucous membranes are moist.     Pharynx: Oropharynx is clear.     Tonsils: No tonsillar exudate.  Eyes:     General: No scleral icterus.       Right eye: No discharge.        Left eye: No discharge.     Extraocular Movements: Extraocular movements intact.     Conjunctiva/sclera: Conjunctivae normal.  Pupils: Pupils are equal, round, and reactive to light.  Cardiovascular:     Rate and Rhythm: Normal rate and regular rhythm.     Pulses: Normal pulses.     Heart sounds: Normal heart sounds. No murmur heard.    No friction rub. No gallop.  Pulmonary:     Effort: Pulmonary effort is normal. No respiratory distress.     Breath sounds: No stridor. Wheezing present. No rhonchi or rales.  Chest:     Chest wall: No tenderness.  Musculoskeletal:        General: Normal range of motion.     Cervical back: Normal range of motion and neck supple.  Skin:    General: Skin is warm and dry.     Capillary Refill: Capillary refill takes less than 2 seconds.     Coloration: Skin is not jaundiced or pale.     Findings: No bruising, erythema, lesion or rash.   Neurological:     General: No focal deficit present.     Mental Status: He is alert and oriented to person, place, and time. Mental status is at baseline.  Psychiatric:        Mood and Affect: Mood normal.        Behavior: Behavior normal.        Thought Content: Thought content normal.        Judgment: Judgment normal.     Results for orders placed or performed in visit on 01/11/22  B Nat Peptide  Result Value Ref Range   BNP 293.7 (H) 0.0 - 100.0 pg/mL  Basic metabolic panel  Result Value Ref Range   Glucose 124 (H) 70 - 99 mg/dL   BUN 15 8 - 27 mg/dL   Creatinine, Ser 0.75 (L) 0.76 - 1.27 mg/dL   eGFR 93 >59 mL/min/1.73   BUN/Creatinine Ratio 20 10 - 24   Sodium 139 134 - 144 mmol/L   Potassium 4.4 3.5 - 5.2 mmol/L   Chloride 100 96 - 106 mmol/L   CO2 24 20 - 29 mmol/L   Calcium 9.0 8.6 - 10.2 mg/dL      Assessment & Plan:   Problem List Items Addressed This Visit       Cardiovascular and Mediastinum   Chronic congestive heart failure ([place]) - Primary    Doing well. Euvolemic today. Continue current regimen. Call with any concerns.       Relevant Orders   Basic metabolic panel   Other Visit Diagnoses     Upper respiratory tract infection, unspecified type       Wheezy, will get him started on prednisone burst. Call with any concerns.   Flu vaccine need       Relevant Orders   Flu Vaccine QUAD High Dose(Fluad) (Completed)        Follow up plan: Return in about 4 months (around 05/28/2022).

## 2022-02-12 ENCOUNTER — Ambulatory Visit: Payer: Medicare Other | Admitting: Family Medicine

## 2022-02-13 ENCOUNTER — Ambulatory Visit (INDEPENDENT_AMBULATORY_CARE_PROVIDER_SITE_OTHER): Payer: Medicare Other | Admitting: Podiatry

## 2022-02-13 ENCOUNTER — Encounter: Payer: Self-pay | Admitting: Podiatry

## 2022-02-13 DIAGNOSIS — E1159 Type 2 diabetes mellitus with other circulatory complications: Secondary | ICD-10-CM

## 2022-02-13 DIAGNOSIS — B351 Tinea unguium: Secondary | ICD-10-CM

## 2022-02-13 DIAGNOSIS — M79674 Pain in right toe(s): Secondary | ICD-10-CM

## 2022-02-13 DIAGNOSIS — M79675 Pain in left toe(s): Secondary | ICD-10-CM | POA: Diagnosis not present

## 2022-02-13 NOTE — Progress Notes (Signed)
  Subjective:  Patient ID: Samuel Morris, male    DOB: 11-18-1945,  MRN: [id number]  Chief Complaint  Patient presents with   Nail Problem    Nail trim    76 y.o. male returns for the above complaint.  Patient presents with thickened elongated dystrophic toenails x10.  Painful to palpation.  Patient would like to have them debrided down.  He is a diabetic with last A1c of 6.0.  He is not able to do it himself.  Objective:  There were no vitals filed for this visit. Podiatric Exam: Vascular: dorsalis pedis and posterior tibial pulses are palpable bilateral. Capillary return is immediate. Temperature gradient is WNL. Skin turgor WNL  Sensorium: Decreased Semmes Weinstein monofilament test.  Decreased tactile sensation bilaterally. Nail Exam: Pt has thick disfigured discolored nails with subungual debris noted bilateral entire nail hallux through fifth toenails.  Pain on palpation to the nails. Ulcer Exam: There is no evidence of ulcer or pre-ulcerative changes or infection. Orthopedic Exam: Muscle tone and strength are WNL. No limitations in general ROM. No crepitus or effusions noted. HAV  B/L.  Hammer toes 2-5  B/L. Skin: No Porokeratosis. No infection or ulcers    Assessment & Plan:   No diagnosis found.     Patient was evaluated and treated and all questions answered.  Onychomycosis with pain  -Nails palliatively debrided as below. -Educated on self-care  Procedure: Nail Debridement Rationale: pain  Type of Debridement: manual, sharp debridement. Instrumentation: Nail nipper, rotary burr. Number of Nails: 10  Procedures and Treatment: Consent by patient was obtained for treatment procedures. The patient understood the discussion of treatment and procedures well. All questions were answered thoroughly reviewed. Debridement of mycotic and hypertrophic toenails, 1 through 5 bilateral and clearing of subungual debris. No ulceration, no infection noted.  Return Visit-Office  Procedure: Patient instructed to return to the office for a follow up visit 3 months for continued evaluation and treatment.  Boneta Lucks, DPM    Return in about 3 months (around 05/15/2022) for [facility].

## 2022-02-19 ENCOUNTER — Telehealth: Payer: Self-pay

## 2022-02-19 NOTE — Progress Notes (Signed)
Chronic Care Management Pharmacy Assistant   Name: Samuel Morris  MRN: [id number] DOB: [birth year]    Reason for Encounter: Disease State   Conditions to be addressed/monitored: HTN   Recent office visits:  02/09/22 Park Liter, DO ([facility]) Orders placed: BMP; Medication changes: prednisone 50 mg  Recent consult visits:  01/19/22 Barb Merino, PA-C Cardiology [facility] ([facility])  Hospital visits:  None in previous 6 months  Medications: Outpatient Encounter Medications as of 02/19/2022  Medication Sig   albuterol (VENTOLIN HFA) 108 (90 Base) MCG/ACT inhaler Inhale 1 puff into the lungs every 4 (four) hours as needed for wheezing or shortness of breath.   Ascorbic Acid (VITAMIN C PO) Take 100 mg by mouth daily.   aspirin EC 81 MG tablet Take 81 mg by mouth daily.   benzonatate (TESSALON PERLES) 100 MG capsule Take 1 capsule (100 mg total) by mouth 3 (three) times daily as needed for cough.   Calcium-Magnesium 100-50 MG TABS Take 1 tablet by mouth daily.   Cholecalciferol (VITAMIN D-1000 MAX ST) 25 MCG (1000 UT) tablet Take 1,000 Units by mouth daily.   dapagliflozin propanediol (FARXIGA) 5 MG TABS tablet Take 1 tablet (5 mg total) by mouth daily.   diclofenac Sodium (VOLTAREN) 1 % GEL    ENTRESTO 49-51 MG Take 1 tablet by mouth 2 (two) times daily.   ezetimibe (ZETIA) 10 MG tablet Take 1 tablet (10 mg total) by mouth daily.   fluticasone (FLONASE) 50 MCG/ACT nasal spray Place 1 spray into both nostrils daily.   fluticasone furoate-vilanterol ([name] ELLIPTA) 100-25 MCG/ACT AEPB Inhale 1 puff into the lungs daily.   folic acid (FOLVITE) 1 MG tablet 1 tablet   furosemide (LASIX) 40 MG tablet Take 1 tablet (40 mg total) by mouth daily.   hydroxychloroquine (PLAQUENIL) 200 MG tablet Take 2 tablets (400 mg total) by mouth daily. Dx M06.9   isosorbide mononitrate (IMDUR) 30 MG 24 hr tablet TAKE 1/2 OF A TABLET (15 MG TOTAL) BY MOUTH DAILY   methotrexate (RHEUMATREX)  2.5 MG tablet Take 6 tablets (15 MG) by mouth weekly for Rheumatoid Arthritis. Dx M06.9   metoprolol succinate (TOPROL-XL) 100 MG 24 hr tablet TAKE 1 AND 1/2 TABLETS BY MOUTH DAILY WITH OR IMMEDIATELY FOLLOWING A MEAL   mexiletine (MEXITIL) 150 MG capsule Take 150 mg by mouth 2 (two) times daily.   nitroGLYCERIN (NITROSTAT) 0.4 MG SL tablet Place 1 tablet (0.4 mg total) under the tongue every 5 (five) minutes as needed for chest pain.   predniSONE (DELTASONE) 50 MG tablet Take 1 tablet (50 mg total) by mouth daily with breakfast.   rivaroxaban (XARELTO) 20 MG TABS tablet Take by mouth.   rosuvastatin (CRESTOR) 20 MG tablet Take 1 tablet (20 mg total) by mouth at bedtime.   spironolactone (ALDACTONE) 25 MG tablet Take 1 tablet (25 mg total) by mouth daily.   Turmeric (QC TUMERIC COMPLEX PO) Take by mouth daily. Unsure of dose   vitamin B-12 (CYANOCOBALAMIN) 1000 MCG tablet Take by mouth daily.   No facility-administered encounter medications on file as of 02/19/2022.   Reviewed chart prior to disease state call. Spoke with patient regarding BP  Recent Office Vitals: BP Readings from Last 3 Encounters:  02/09/22 110/73  01/15/22 107/70  01/11/22 111/63   Pulse Readings from Last 3 Encounters:  02/09/22 86  01/15/22 87  01/11/22 63    Wt Readings from Last 3 Encounters:  02/09/22 176 lb 4.8 oz (80 kg)  01/15/22 173 lb 14.4 oz (78.9 kg)  01/11/22 180 lb (81.6 kg)     Kidney Function Lab Results  Component Value Date/Time   CREATININE 0.75 (L) 01/11/2022 11:11 AM   CREATININE 0.89 11/27/2021 01:52 PM   GFRNONAA >60 01/12/2021 05:37 AM   GFRAA 96 04/11/2020 10:06 AM       Latest Ref Rng & Units 01/11/2022   11:11 AM 11/27/2021    1:52 PM 04/13/2021    8:29 AM  BMP  Glucose 70 - 99 mg/dL 124  105  148   BUN 8 - 27 mg/dL '15  14  18   '$ Creatinine 0.76 - 1.27 mg/dL 0.75  0.89  0.90   BUN/Creat Ratio 10 - '24 20  16  20   '$ Sodium 134 - 144 mmol/L 139  138  137   Potassium 3.5 - 5.2  mmol/L 4.4  4.4  4.8   Chloride 96 - 106 mmol/L 100  99  99   CO2 20 - 29 mmol/L '24  20  25   '$ Calcium 8.6 - 10.2 mg/dL 9.0  9.3  9.9    Reviewed chart prior to disease state call. 3 attempts to reach patient. Unable to speak with patient regarding BP, left voicemail to return call.  Current antihypertensive regimen:  Isosorbide mononitrate 30 mg 1/2 tab daily Metoprolol succinate 100 mg 1 and 1/2 tab daily Spironolactone 25 mg 1 tab daily   What recent interventions/DTPs have been made by any provider to improve Blood Pressure control since last CPP Visit: When you do check blood pressure, keep track of the top (systolic) and bottom (diastolic) numbers as well as the pulse (heart rate) in a log. Goal blood pressure is between 130/80 and 90/45. Normal heart rate is 45-100. Do not add salt to your food, per [facility] on 01/19/22.   Adherence Review: Is the patient currently on ACE/ARB medication? No Does the patient have >5 day gap between last estimated fill dates? No     Care Gaps: Colonoscopy-Colorectal cancer screening: No longer required.   Diabetic Foot Exam-10/11/20 Ophthalmology-10/25/20 Dexa Scan - NA Annual Well Visit - 04/03/21 (Medicare) Micro albumin-11/27/21 Hemoglobin A1c- 11/27/21 (6.4), 04/13/21 (6.4)  Star Rating Drugs: Rosuvastatin 20 mg-last fill 11/27/21 90 ds at [facility], 10/29/21 90 ds at [place] [facility]

## 2022-03-01 ENCOUNTER — Telehealth: Payer: Self-pay | Admitting: *Deleted

## 2022-03-01 NOTE — Patient Outreach (Signed)
  Care Coordination [facility] Note Transition Care Management Unsuccessful Follow-up Telephone Call  Date of discharge and from where:  [facility] [phone]  Attempts:  1st Attempt  Reason for unsuccessful TCM follow-up call:  No answer/busy  [place] Management [phone]

## 2022-03-07 ENCOUNTER — Telehealth: Payer: Self-pay | Admitting: Family Medicine

## 2022-03-07 ENCOUNTER — Telehealth: Payer: Self-pay

## 2022-03-07 ENCOUNTER — Encounter: Payer: Self-pay | Admitting: Family Medicine

## 2022-03-07 NOTE — Telephone Encounter (Signed)
Patient was seen in the hospital, was discharged on 03/03/22 and is needing a hospital follow up, next hospital follow up is for 03/20/22. Please advise

## 2022-03-07 NOTE — Patient Outreach (Signed)
  Care Coordination [facility] Note Transition Care Management Follow-up Telephone Call Date of discharge and from where: [facility] 02/28/22 and 03/02/22 (ED visit after fall). How have you been since you were released from the hospital? Patient feels he still has pneumonia, he has been using his albuterol nebulizer.  He was also admitted for CHF and his weight is down 10lbs since his hospitalization, weight today 175. Any questions or concerns? Yes- Patient tried to call PCP office to set up appt within 7 days.  This RNCM requested appointment be scheduled by Care Guide and patient called.  Items Reviewed: Did the pt receive and understand the discharge instructions provided? Yes  Medications obtained and verified? Yes  Other? No  Any new allergies since your discharge? No  Dietary orders reviewed? No Do you have support at home? Yes   Home Care and Equipment/Supplies: Were home health services ordered? no If so, what is the name of the agency? N/A  Has the agency set up a time to come to the patient's home? no Were any new equipment or medical supplies ordered?  No What is the name of the medical supply agency? na Were you able to get the supplies/equipment? not applicable Do you have any questions related to the use of the equipment or supplies? No  Functional Questionnaire: (I = Independent and D = Dependent) ADLs: I  Bathing/Dressing- I  Meal Prep- D  Eating- I  Maintaining continence- I  Transferring/Ambulation- I  Managing Meds- I  Follow up appointments reviewed:  PCP Hospital f/u appt confirmed? No   Specialist Hospital f/u appt confirmed? No   Are transportation arrangements needed? No  If their condition worsens, is the pt aware to call PCP or go to the Emergency Dept.? Yes Was the patient provided with contact information for the PCP's office or ED? Yes Was to pt encouraged to call back with questions or concerns? Yes  SDOH assessments and interventions completed:    Yes SDOH Interventions Today    Flowsheet Row Most Recent Value  SDOH Interventions   Housing Interventions Intervention Not Indicated  Transportation Interventions Intervention Not Indicated  Utilities Interventions Intervention Not Indicated       Care Coordination Interventions:  PCP follow up appointment requested   Encounter Outcome:  Pt. Visit Completed

## 2022-03-08 NOTE — Telephone Encounter (Signed)
Pt scheduled 12/29 with Junie Panning

## 2022-03-08 NOTE — Telephone Encounter (Signed)
Needs hospital follow up. I do not have anything this week.

## 2022-03-08 NOTE — Telephone Encounter (Signed)
Pt scheduled  

## 2022-03-09 ENCOUNTER — Ambulatory Visit (INDEPENDENT_AMBULATORY_CARE_PROVIDER_SITE_OTHER): Payer: Medicare Other | Admitting: Physician Assistant

## 2022-03-09 ENCOUNTER — Encounter: Payer: Self-pay | Admitting: Physician Assistant

## 2022-03-09 VITALS — BP 100/69 | HR 85 | Temp 97.8°F | Ht 64.02 in | Wt 170.9 lb

## 2022-03-09 DIAGNOSIS — I509 Heart failure, unspecified: Secondary | ICD-10-CM | POA: Diagnosis not present

## 2022-03-09 DIAGNOSIS — J121 Respiratory syncytial virus pneumonia: Secondary | ICD-10-CM | POA: Diagnosis not present

## 2022-03-09 MED ORDER — AMOXICILLIN-POT CLAVULANATE 875-125 MG PO TABS: 1.0000 | ORAL_TABLET | Freq: Two times a day (BID) | ORAL | 0 refills | Status: AC

## 2022-03-09 MED ORDER — ALBUTEROL SULFATE (2.5 MG/3ML) 0.083% IN NEBU: 2.5000 mg | INHALATION_SOLUTION | Freq: Four times a day (QID) | RESPIRATORY_TRACT | 1 refills | Status: DC | PRN

## 2022-03-09 MED ORDER — AZITHROMYCIN 250 MG PO TABS: ORAL_TABLET | ORAL | 0 refills | Status: DC

## 2022-03-09 MED ORDER — PREDNISONE 20 MG PO TABS: 40.0000 mg | ORAL_TABLET | Freq: Every day | ORAL | 0 refills | Status: AC

## 2022-03-09 NOTE — Assessment & Plan Note (Signed)
Acute, ongoing concern Patient was hospitalized 02-25-22 through 02-28-22 for pneumonia secondary to RSV infection  He reports continued SOB and coughing despite finishing Doxycycline course and still taking Prednisone taper PE is concerning for wheezing and rhonchi throughout lower lung fields  Will initiate pneumonia abx with Augmentin 875-125 mg PO BID x 7 days as well as Zpack  Will change prednisone taper to Prednisone 40 mg PO QD x 7 day burst to assist with breathing  Reviewed using nebulizer and inhalers to further breathing management  Reviewed O2 monitoring and ED/ return precautions Recommend follow up next week in office to ensure resolving symptoms and follow up with Pulmonology provider given previous hx of lung disease

## 2022-03-09 NOTE — Assessment & Plan Note (Signed)
Chronic, appears to be well managed at this time Discussed weights, fluid retention, breathing and Lasix use since he is recovering from RSV pneumonia He reports overall stable weights and denies fluid gain over 2 lbs at this time  Continue current regimen. Follow up as needed

## 2022-03-09 NOTE — Progress Notes (Signed)
Established Patient Office Visit  Name: Samuel Morris   MRN: [id number]    DOB: 1945/06/23   Date:03/09/2022  Today's Provider: Talitha Givens, MHS, PA-C Introduced myself to the patient as a PA-C and provided education on APPs in clinical practice.         Subjective  Chief Complaint  Chief Complaint  Patient presents with   Hospitalization Follow-up    D/C's on 12/21, Dx'd with RSV and CHF   Fall    12/22 went to ER.    HPI     Patient is here for hospitalization follow up and ED follow up   ED Follow up Patient fell on 03/02/22 from standing and was seen in the ED at [place] CT head and cervical spine results as well as CT chest/abdomen/pelvis negative for acute injuries   Reports he is able to ambulate and get around without much issue States he is sore along left ribs    Breathing concerns secondary to RSV pneumonia Reports breathing is still an issue  States laying on his back causes breathing issues  CT chest/abdomen/pelvis demonstrates lingering infection and consolidation from RSV infection  He is still taking the steroid taper as directed  He has finished the Doxycycline 5 day course  He has been using his Albuterol nebulizer treatment several times per day and is using his [name] as directed    He has been taking lasix for swelling  He has been weighing himself at home - he has not noted weight gain over 2-4 lbs between days     Transition of [facility] Follow up.   Hospital/Facility: [facility]  D/C Physician: Judd Lien, MD  D/C Date: 02/28/22  Records Requested:  Records Received:  Records Reviewed: Hospital discharge summary   Diagnoses on Discharge: RSV infection, Acute respiratory failure with hypoxia, Acute on chronic heart failure, acute on chronic systolic heart failure  Date of interactive Contact within 48 hours of discharge: 03/01/22 Contact was through: phone  Date of 7 day or 14 day face-to-face visit:     within 14 days  Outpatient Encounter Medications as of 03/09/2022  Medication Sig   albuterol (PROVENTIL) (2.5 MG/3ML) 0.083% nebulizer solution Take 3 mLs (2.5 mg total) by nebulization every 6 (six) hours as needed for wheezing or shortness of breath.   albuterol (VENTOLIN HFA) 108 (90 Base) MCG/ACT inhaler Inhale 1 puff into the lungs every 4 (four) hours as needed for wheezing or shortness of breath.   amoxicillin-clavulanate (AUGMENTIN) 875-125 MG tablet Take 1 tablet by mouth 2 (two) times daily for 10 days.   Ascorbic Acid (VITAMIN C PO) Take 100 mg by mouth daily.   aspirin EC 81 MG tablet Take 81 mg by mouth daily.   azithromycin (ZITHROMAX) 250 MG tablet Take 575m PO daily x1d and then 255mdaily x4 days   benzonatate (TESSALON PERLES) 100 MG capsule Take 1 capsule (100 mg total) by mouth 3 (three) times daily as needed for cough.   Calcium-Magnesium 100-50 MG TABS Take 1 tablet by mouth daily.   Cholecalciferol (VITAMIN D-1000 MAX ST) 25 MCG (1000 UT) tablet Take 1,000 Units by mouth daily.   dapagliflozin propanediol (FARXIGA) 5 MG TABS tablet Take 1 tablet (5 mg total) by mouth daily.   diclofenac Sodium (VOLTAREN) 1 % GEL    ENTRESTO 49-51 MG Take 1 tablet by mouth 2 (two) times daily.   ezetimibe (ZETIA) 10 MG tablet Take 1 tablet (  10 mg total) by mouth daily.   fluticasone (FLONASE) 50 MCG/ACT nasal spray Place 1 spray into both nostrils daily.   fluticasone furoate-vilanterol ([name] ELLIPTA) 100-25 MCG/ACT AEPB Inhale 1 puff into the lungs daily.   folic acid (FOLVITE) 1 MG tablet 1 tablet   furosemide (LASIX) 40 MG tablet Take 1 tablet (40 mg total) by mouth daily.   hydroxychloroquine (PLAQUENIL) 200 MG tablet Take 2 tablets (400 mg total) by mouth daily. Dx M06.9   isosorbide mononitrate (IMDUR) 30 MG 24 hr tablet TAKE 1/2 OF A TABLET (15 MG TOTAL) BY MOUTH DAILY   methotrexate (RHEUMATREX) 2.5 MG tablet Take 6 tablets (15 MG) by mouth weekly for Rheumatoid Arthritis. Dx  M06.9   metoprolol succinate (TOPROL-XL) 100 MG 24 hr tablet TAKE 1 AND 1/2 TABLETS BY MOUTH DAILY WITH OR IMMEDIATELY FOLLOWING A MEAL   mexiletine (MEXITIL) 150 MG capsule Take 150 mg by mouth 2 (two) times daily.   nitroGLYCERIN (NITROSTAT) 0.4 MG SL tablet Place 1 tablet (0.4 mg total) under the tongue every 5 (five) minutes as needed for chest pain.   predniSONE (DELTASONE) 20 MG tablet Take 2 tablets (40 mg total) by mouth daily with breakfast for 7 days.   rivaroxaban (XARELTO) 20 MG TABS tablet Take by mouth.   rosuvastatin (CRESTOR) 20 MG tablet Take 1 tablet (20 mg total) by mouth at bedtime.   spironolactone (ALDACTONE) 25 MG tablet Take 1 tablet (25 mg total) by mouth daily.   Turmeric (QC TUMERIC COMPLEX PO) Take by mouth daily. Unsure of dose   vitamin B-12 (CYANOCOBALAMIN) 1000 MCG tablet Take by mouth daily.   [DISCONTINUED] predniSONE (DELTASONE) 50 MG tablet Take 1 tablet (50 mg total) by mouth daily with breakfast.   No facility-administered encounter medications on file as of 03/09/2022.    Diagnostic Tests Reviewed/Disposition: Imaging, labs during hospital stay and on dc   Consults: none  Discharge Instructions: Low sodium diet, activity as tolerated, inhaler use was reviewed and heart failure education provided   Disease/illness Education:heart failure education provided   Home Health/Community Services Discussions/Referrals: NA  Establishment or re-establishment of referral orders for community resources:  Discussion with other health care providers: No  Assessment and Support of treatment regimen adherence: Patient and wife are aware of therapy regimen and have been following dc instructions   Appointments Coordinated with: Recommend follow up with Pulmonology given previous lung disease hx and severity of RSV infection   Education for self-management, independent living, and ADLs:         Patient Active Problem List   Diagnosis Date Noted    Bronchopneumonia due to respiratory syncytial virus (RSV) 03/09/2022   Chronic congestive heart failure ([place]) 01/11/2022   Subclinical hypothyroidism 01/03/2022   NSTEMI (non-ST elevated myocardial infarction) ([place]) 01/10/2021   Senile purpura ([place]) 10/11/2020   Rheumatoid arthritis ([place]) 07/09/2020   Interstitial lung disease ([place]) 07/09/2020   CAD (coronary artery disease) 11/05/2018   Hyperlipidemia associated with type 2 diabetes mellitus ([place]) 11/05/2018   Type 2 diabetes mellitus with cardiac complication ([place]) 85/27/7824   OSA (obstructive sleep apnea) 11/05/2018   HTN (hypertension) 11/05/2018   Aortic stenosis 11/05/2018   Presence of automatic (implantable) cardiac defibrillator 11/05/2018   Hx of melanoma of skin 11/05/2018   Angina pectoris ([place]) 11/05/2018   Pulmonary nodules 11/05/2018   Dilated cardiomyopathy ([place]) 11/05/2018   Adhesive capsulitis of both shoulders 03/16/2018   Chronic pain of both shoulders 03/16/2018   Kyphoscoliosis deformity of spine  03/16/2018   Bilateral cataracts 01/20/2018   Overweight 12/18/2017   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 11/12/2017   DJD of right shoulder 04/24/2017   Right knee DJD 04/24/2017   S/P AVR (aortic valve replacement) 12/17/2016   Ischemic cardiomyopathy 07/11/2016   Murmur 07/11/2016   H/O acute myocardial infarction 07/09/2016    Past Surgical History:  Procedure Laterality Date   [place]     LEFT HEART CATH AND CORS/GRAFTS ANGIOGRAPHY N/A 01/10/2021   Procedure: LEFT HEART CATH AND CORS/GRAFTS ANGIOGRAPHY;  Surgeon: Nelva Bush, MD;  Location: [facility];  Service: Cardiovascular;  Laterality: N/A;    Family History  Problem Relation Age of Onset   Heart disease Mother    Heart attack Father    Arthritis Sister    Hypertension Sister    Heart murmur Sister    Hypertension Brother    Prostate  cancer Brother    Heart disease Paternal Grandmother     Social History   Tobacco Use   Smoking status: Never   Smokeless tobacco: Never  Substance Use Topics   Alcohol use: Yes    Comment: On occasion     Current Outpatient Medications:    albuterol (PROVENTIL) (2.5 MG/3ML) 0.083% nebulizer solution, Take 3 mLs (2.5 mg total) by nebulization every 6 (six) hours as needed for wheezing or shortness of breath., Disp: 150 mL, Rfl: 1   albuterol (VENTOLIN HFA) 108 (90 Base) MCG/ACT inhaler, Inhale 1 puff into the lungs every 4 (four) hours as needed for wheezing or shortness of breath., Disp: 18 g, Rfl: 3   amoxicillin-clavulanate (AUGMENTIN) 875-125 MG tablet, Take 1 tablet by mouth 2 (two) times daily for 10 days., Disp: 20 tablet, Rfl: 0   Ascorbic Acid (VITAMIN C PO), Take 100 mg by mouth daily., Disp: , Rfl:    aspirin EC 81 MG tablet, Take 81 mg by mouth daily., Disp: , Rfl:    azithromycin (ZITHROMAX) 250 MG tablet, Take 585m PO daily x1d and then 2522mdaily x4 days, Disp: 6 each, Rfl: 0   benzonatate (TESSALON PERLES) 100 MG capsule, Take 1 capsule (100 mg total) by mouth 3 (three) times daily as needed for cough., Disp: 60 capsule, Rfl: 1   Calcium-Magnesium 100-50 MG TABS, Take 1 tablet by mouth daily., Disp: , Rfl:    Cholecalciferol (VITAMIN D-1000 MAX ST) 25 MCG (1000 UT) tablet, Take 1,000 Units by mouth daily., Disp: , Rfl:    dapagliflozin propanediol (FARXIGA) 5 MG TABS tablet, Take 1 tablet (5 mg total) by mouth daily., Disp: 90 tablet, Rfl: 1   diclofenac Sodium (VOLTAREN) 1 % GEL, , Disp: , Rfl:    ENTRESTO 49-51 MG, Take 1 tablet by mouth 2 (two) times daily., Disp: , Rfl:    ezetimibe (ZETIA) 10 MG tablet, Take 1 tablet (10 mg total) by mouth daily., Disp: 90 tablet, Rfl: 1   fluticasone (FLONASE) 50 MCG/ACT nasal spray, Place 1 spray into both nostrils daily., Disp: , Rfl:    fluticasone furoate-vilanterol ([name] ELLIPTA) 100-25 MCG/ACT AEPB, Inhale 1 puff into the  lungs daily., Disp: 60 each, Rfl: 3   folic acid (FOLVITE) 1 MG tablet, 1 tablet, Disp: , Rfl:    furosemide (LASIX) 40 MG tablet, Take 1 tablet (40 mg total) by mouth daily., Disp: 90 tablet, Rfl: 1   hydroxychloroquine (PLAQUENIL) 200 MG tablet, Take 2 tablets (400 mg  total) by mouth daily. Dx M06.9, Disp: 180 tablet, Rfl: 1   isosorbide mononitrate (IMDUR) 30 MG 24 hr tablet, TAKE 1/2 OF A TABLET (15 MG TOTAL) BY MOUTH DAILY, Disp: 45 tablet, Rfl: 1   methotrexate (RHEUMATREX) 2.5 MG tablet, Take 6 tablets (15 MG) by mouth weekly for Rheumatoid Arthritis. Dx M06.9, Disp: 48 tablet, Rfl: 1   metoprolol succinate (TOPROL-XL) 100 MG 24 hr tablet, TAKE 1 AND 1/2 TABLETS BY MOUTH DAILY WITH OR IMMEDIATELY FOLLOWING A MEAL, Disp: 45 tablet, Rfl: 1   mexiletine (MEXITIL) 150 MG capsule, Take 150 mg by mouth 2 (two) times daily., Disp: , Rfl:    nitroGLYCERIN (NITROSTAT) 0.4 MG SL tablet, Place 1 tablet (0.4 mg total) under the tongue every 5 (five) minutes as needed for chest pain., Disp: 25 tablet, Rfl: 3   predniSONE (DELTASONE) 20 MG tablet, Take 2 tablets (40 mg total) by mouth daily with breakfast for 7 days., Disp: 14 tablet, Rfl: 0   rivaroxaban (XARELTO) 20 MG TABS tablet, Take by mouth., Disp: , Rfl:    rosuvastatin (CRESTOR) 20 MG tablet, Take 1 tablet (20 mg total) by mouth at bedtime., Disp: 90 tablet, Rfl: 1   spironolactone (ALDACTONE) 25 MG tablet, Take 1 tablet (25 mg total) by mouth daily., Disp: 90 tablet, Rfl: 1   Turmeric (QC TUMERIC COMPLEX PO), Take by mouth daily. Unsure of dose, Disp: , Rfl:    vitamin B-12 (CYANOCOBALAMIN) 1000 MCG tablet, Take by mouth daily., Disp: , Rfl:   No Known Allergies  I personally reviewed active problem list, medication list, allergies, notes from last encounter, notes from last 3 encounters, lab results, imaging with the patient/caregiver today.   Review of Systems  Constitutional:  Positive for malaise/fatigue. Negative for fever.  HENT:   Positive for congestion.   Respiratory:  Positive for cough and shortness of breath. Negative for sputum production.   Musculoskeletal:  Positive for falls.       Rib soreness       Objective  Vitals:   03/09/22 1056  BP: 100/69  Pulse: 85  Temp: 97.8 F (36.6 C)  TempSrc: Oral  SpO2: 93%  Weight: 170 lb 14.4 oz (77.5 kg)  Height: 5' 4.02" (1.626 m)    Body mass index is 29.32 kg/m.  Physical Exam Vitals reviewed.  Constitutional:      General: He is awake.     Appearance: Normal appearance. He is well-developed and well-groomed.  HENT:     Head: Normocephalic and atraumatic.     Right Ear: Tympanic membrane, ear canal and external ear normal.     Left Ear: Tympanic membrane, ear canal and external ear normal.     Nose: Rhinorrhea present. No congestion. Rhinorrhea is clear.     Mouth/Throat:     Lips: Pink.     Mouth: Mucous membranes are moist.     Pharynx: Oropharynx is clear. Uvula midline. No oropharyngeal exudate or posterior oropharyngeal erythema.  Eyes:     General: Lids are normal. Gaze aligned appropriately.     Extraocular Movements: Extraocular movements intact.     Conjunctiva/sclera: Conjunctivae normal.  Cardiovascular:     Rate and Rhythm: Normal rate and regular rhythm.     Heart sounds: Normal heart sounds. No murmur heard.    No friction rub. No gallop.  Pulmonary:     Effort: Pulmonary effort is normal.     Breath sounds: Decreased air movement present. Examination of the right-middle field reveals  decreased breath sounds and wheezing. Examination of the left-middle field reveals decreased breath sounds. Examination of the right-lower field reveals decreased breath sounds and wheezing. Examination of the left-lower field reveals wheezing. Decreased breath sounds, wheezing and rhonchi present.  Musculoskeletal:     Cervical back: Normal range of motion and neck supple.  Skin:    General: Skin is warm.  Neurological:     General: No focal  deficit present.     Mental Status: He is alert and oriented to person, place, and time.  Psychiatric:        Mood and Affect: Mood normal.        Behavior: Behavior normal. Behavior is cooperative.        Thought Content: Thought content normal.        Judgment: Judgment normal.      Recent Results (from the past 2160 hour(s))  TSH     Status: Abnormal   Collection Time: 01/02/22 10:34 AM  Result Value Ref Range   TSH 4.590 (H) 0.450 - 4.500 uIU/mL  B Nat Peptide     Status: Abnormal   Collection Time: 01/11/22 11:11 AM  Result Value Ref Range   BNP 293.7 (H) 0.0 - 100.0 pg/mL    Comment: Siemens ADVIA Centaur XP methodology  Basic metabolic panel     Status: Abnormal   Collection Time: 01/11/22 11:11 AM  Result Value Ref Range   Glucose 124 (H) 70 - 99 mg/dL   BUN 15 8 - 27 mg/dL   Creatinine, Ser 0.75 (L) 0.76 - 1.27 mg/dL   eGFR 93 >59 mL/min/1.73   BUN/Creatinine Ratio 20 10 - 24   Sodium 139 134 - 144 mmol/L   Potassium 4.4 3.5 - 5.2 mmol/L   Chloride 100 96 - 106 mmol/L   CO2 24 20 - 29 mmol/L   Calcium 9.0 8.6 - 10.2 mg/dL     PHQ2/9:    02/09/2022   10:52 AM 12/26/2021    9:35 AM 08/15/2021    2:57 PM 04/13/2021    8:28 AM 04/03/2021   10:02 AM  Depression screen PHQ 2/9  Decreased Interest 0 0 0 0 0  Down, Depressed, Hopeless 0 0 0 0 0  PHQ - 2 Score 0 0 0 0 0  Altered sleeping 0 0 0 0   Tired, decreased energy 0 0 0 0   Change in appetite 0 0 0 0   Feeling bad or failure about yourself  0 0 0 0   Trouble concentrating 0 0 0 0   Moving slowly or fidgety/restless 0 0 0 0   Suicidal thoughts 0 0 0 0   PHQ-9 Score 0 0 0 0   Difficult doing work/chores Not difficult at all Not difficult at all Not difficult at all        Fall Risk:    03/07/2022    1:06 PM 02/09/2022   10:53 AM 12/26/2021    9:35 AM 08/15/2021    2:57 PM 04/13/2021    8:28 AM  Fall Risk   Falls in the past year? 1 0 0 0 0  Number falls in past yr: 0 0 0 0 0  Injury with Fall?  0 0 0  0  Risk for fall due to : History of fall(s);Impaired mobility No Fall Risks No Fall Risks No Fall Risks No Fall Risks  Follow up Follow up appointment Falls evaluation completed Falls evaluation completed Falls evaluation completed Falls evaluation completed  Comment Requested appointment with PCP          Functional Status Survey:      Assessment & Plan  Problem List Items Addressed This Visit       Cardiovascular and Mediastinum   Chronic congestive heart failure (HCC)    Chronic, appears to be well managed at this time Discussed weights, fluid retention, breathing and Lasix use since he is recovering from RSV pneumonia He reports overall stable weights and denies fluid gain over 2 lbs at this time  Continue current regimen. Follow up as needed         Respiratory   Bronchopneumonia due to respiratory syncytial virus (RSV) - Primary    Acute, ongoing concern Patient was hospitalized 02-25-22 through 02-28-22 for pneumonia secondary to RSV infection  He reports continued SOB and coughing despite finishing Doxycycline course and still taking Prednisone taper PE is concerning for wheezing and rhonchi throughout lower lung fields  Will initiate pneumonia abx with Augmentin 875-125 mg PO BID x 7 days as well as Zpack  Will change prednisone taper to Prednisone 40 mg PO QD x 7 day burst to assist with breathing  Reviewed using nebulizer and inhalers to further breathing management  Reviewed O2 monitoring and ED/ return precautions Recommend follow up next week in office to ensure resolving symptoms and follow up with Pulmonology provider given previous hx of lung disease       Relevant Medications   azithromycin (ZITHROMAX) 250 MG tablet   predniSONE (DELTASONE) 20 MG tablet   amoxicillin-clavulanate (AUGMENTIN) 875-125 MG tablet   albuterol (PROVENTIL) (2.5 MG/3ML) 0.083% nebulizer solution     No follow-ups on file.   I, Icelynn Onken E Riker Collier, PA-C, have reviewed all  documentation for this visit. The documentation on 03/09/22 for the exam, diagnosis, procedures, and orders are all accurate and complete.   Talitha Givens, MHS, PA-C [facility]

## 2022-03-14 ENCOUNTER — Telehealth: Payer: Self-pay

## 2022-03-14 NOTE — Progress Notes (Unsigned)
Reason for Encounter: Diabetes  Contacted patient on 03/14/2022 to discuss diabetes disease state.   Recent office visits:  [name], PA-C. Seen for hospital follow up visit for a fall. Will initiate pneumonia abx with Augmentin 875-125 mg PO BID x 7 days as well as Zpack  Will change prednisone taper to Prednisone 40 mg PO QD x 7 day burst to assist with breathing. 02/09/22-Samuel Annia Friendly, DO (PCP) Seen for a cough and congestive heart failure. Labs ordered. Flu vaccine given. Follow up in 4 months.  01/15/22-Samuel Annia Friendly, DO (PCP) Seen for congestive heart failure and a cough. Follow up in 4 weeks. 01/11/22-Samuel Annia Friendly, DO (PCP) Seen for a cough and leg swelling. Will increase him to '60mg'$  on his lasix for the next 4 days and follow up on Monday. Likely will need to go back on his '40mg'$  of lasix daily.  01/01/22-[name], NP. Seen for upper respiratory infection. Ambulatory referral to Rheumatology. Chest x-ray ordered. Follow up in 1 week.  12/26/21-[name], PA-C. Seen for nasal congestion, sore throat and sinus drainage.   Recent consult visits:  02/13/22-Samuel Morris, DPM (Podiatry) Seen for a nail problem. Follow up in 3 months.  Hospital visits:  Medication Reconciliation was completed by comparing discharge summary, patient's EMR and Pharmacy list, and upon discussion with patient.  Admitted to the hospital on 03/02/22 due to a fall. Discharge date was 03/02/22. Discharged from [place]?Medications Started at [facility] Discharge:?? -started none noted  Medication Changes at Hospital Discharge: -Changed none noted  Medications Discontinued at Hospital Discharge: -Stopped none noted  Medications that remain the same after Hospital Discharge:??  -All other medications will remain the same.    Medication Reconciliation was completed by comparing discharge summary, patient's EMR and Pharmacy list, and upon discussion with  patient.  Admitted to the hospital on 02/24/22 due to SOB. Discharge date was 02/25/22. Discharged from [place]?Medications Started at [facility] Discharge:?? -started none noted  Medication Changes at Hospital Discharge: -Changed none noted  Medications Discontinued at Hospital Discharge: -Stopped none noted  Medications that remain the same after Hospital Discharge:??  -All other medications will remain the same.    Medications: Outpatient Encounter Medications as of 03/14/2022  Medication Sig   albuterol (PROVENTIL) (2.5 MG/3ML) 0.083% nebulizer solution Take 3 mLs (2.5 mg total) by nebulization every 6 (six) hours as needed for wheezing or shortness of breath.   albuterol (VENTOLIN HFA) 108 (90 Base) MCG/ACT inhaler Inhale 1 puff into the lungs every 4 (four) hours as needed for wheezing or shortness of breath.   amoxicillin-clavulanate (AUGMENTIN) 875-125 MG tablet Take 1 tablet by mouth 2 (two) times daily for 10 days.   Ascorbic Acid (VITAMIN C PO) Take 100 mg by mouth daily.   aspirin EC 81 MG tablet Take 81 mg by mouth daily.   azithromycin (ZITHROMAX) 250 MG tablet Take '500mg'$  PO daily x1d and then '250mg'$  daily x4 days   benzonatate (TESSALON PERLES) 100 MG capsule Take 1 capsule (100 mg total) by mouth 3 (three) times daily as needed for cough.   Calcium-Magnesium 100-50 MG TABS Take 1 tablet by mouth daily.   Cholecalciferol (VITAMIN D-1000 MAX ST) 25 MCG (1000 UT) tablet Take 1,000 Units by mouth daily.   dapagliflozin propanediol (FARXIGA) 5 MG TABS tablet Take 1 tablet (5 mg total) by mouth daily.   diclofenac Sodium (VOLTAREN) 1 % GEL  ENTRESTO 49-51 MG Take 1 tablet by mouth 2 (two) times daily.   ezetimibe (ZETIA) 10 MG tablet Take 1 tablet (10 mg total) by mouth daily.   fluticasone (FLONASE) 50 MCG/ACT nasal spray Place 1 spray into both nostrils daily.   fluticasone furoate-vilanterol ([name] ELLIPTA) 100-25 MCG/ACT AEPB Inhale 1 puff into  the lungs daily.   folic acid (FOLVITE) 1 MG tablet 1 tablet   furosemide (LASIX) 40 MG tablet Take 1 tablet (40 mg total) by mouth daily.   hydroxychloroquine (PLAQUENIL) 200 MG tablet Take 2 tablets (400 mg total) by mouth daily. Dx M06.9   isosorbide mononitrate (IMDUR) 30 MG 24 hr tablet TAKE 1/2 OF A TABLET (15 MG TOTAL) BY MOUTH DAILY   methotrexate (RHEUMATREX) 2.5 MG tablet Take 6 tablets (15 MG) by mouth weekly for Rheumatoid Arthritis. Dx M06.9   metoprolol succinate (TOPROL-XL) 100 MG 24 hr tablet TAKE 1 AND 1/2 TABLETS BY MOUTH DAILY WITH OR IMMEDIATELY FOLLOWING A MEAL   mexiletine (MEXITIL) 150 MG capsule Take 150 mg by mouth 2 (two) times daily.   nitroGLYCERIN (NITROSTAT) 0.4 MG SL tablet Place 1 tablet (0.4 mg total) under the tongue every 5 (five) minutes as needed for chest pain.   predniSONE (DELTASONE) 20 MG tablet Take 2 tablets (40 mg total) by mouth daily with breakfast for 7 days.   rivaroxaban (XARELTO) 20 MG TABS tablet Take by mouth.   rosuvastatin (CRESTOR) 20 MG tablet Take 1 tablet (20 mg total) by mouth at bedtime.   spironolactone (ALDACTONE) 25 MG tablet Take 1 tablet (25 mg total) by mouth daily.   Turmeric (QC TUMERIC COMPLEX PO) Take by mouth daily. Unsure of dose   vitamin B-12 (CYANOCOBALAMIN) 1000 MCG tablet Take by mouth daily.   No facility-administered encounter medications on file as of 03/14/2022.    Recent Relevant Labs: Lab Results  Component Value Date/Time   HGBA1C 6.4 (H) 11/27/2021 01:50 PM   HGBA1C 6.4 (H) 04/13/2021 08:27 AM   HGBA1C 6.5 12/20/2017 12:00 AM   HGBA1C 6.5 12/14/2016 12:00 AM   MICROALBUR 80 (H) 11/27/2021 01:50 PM   MICROALBUR 10 09/10/2019 08:45 AM    Kidney Function Lab Results  Component Value Date/Time   CREATININE 0.75 (L) 01/11/2022 11:11 AM   CREATININE 0.89 11/27/2021 01:52 PM   GFRNONAA >60 01/12/2021 05:37 AM   GFRAA 96 04/11/2020 10:06 AM    Current antihyperglycemic regimen:  Farxiga 5 mg take 1 tab  daily  Patient verbally confirms he is taking the above medications as directed. {yes/no:20286}  What diet changes have been made to improve diabetes control?  What recent interventions/DTPs have been made to improve glycemic control:  None noted  Have there been any recent hospitalizations or ED visits since last visit with PharmD? Yes  Patient {reports/denies:24182} hypoglycemic symptoms, including {Hypoglycemic Symptoms:3049003}  Patient {reports/denies:24182} hyperglycemic symptoms, including {symptoms; hyperglycemia:17903}  How often are you checking your blood sugar? {BG Testing frequency:23922}  What are your blood sugars ranging?  Fasting: *** Before meals: *** After meals: *** Bedtime: ***  During the week, how often does your blood glucose drop below 70? {LowBGfrequency:24142}  Are you checking your feet daily/regularly? {yes/no:20286}  Adherence Review: Is the patient currently on a STATIN medication? Yes Is the patient currently on ACE/ARB medication? No Does the patient have >5 day gap between last estimated fill dates? No  Star Rating Drugs:  Farxiga 5 mg Last filled:03/29/21 30 DS, 02/04/22 30 DS Rosuvastatin 20 mg Last filled:11/27/21  90 DS,10/29/21 90 DS   Care Gaps: Annual wellness visit in last year? {yes/no:20286} Last eye exam / retinopathy screening: Last diabetic foot exam:   Samuel Morris

## 2022-04-02 ENCOUNTER — Telehealth: Payer: Self-pay | Admitting: Family Medicine

## 2022-04-02 NOTE — Telephone Encounter (Signed)
Pts wife is calling to request if a referral to urology to be sent to [facility] with Dr. Jordan Hawks and Dr. Zara Council. Please advise CB- [phone]

## 2022-04-03 NOTE — Telephone Encounter (Signed)
Left message for patient to gather more information in regards to what he would need to be seen for in order to place the referral per Dr.Johnson.   OK for PEC/Nurse Triage to gather information if patient or wife calls back.

## 2022-04-03 NOTE — Telephone Encounter (Signed)
Please find out what he would like to be seen for

## 2022-04-12 ENCOUNTER — Telehealth: Payer: Self-pay

## 2022-04-12 NOTE — Progress Notes (Cosign Needed)
Care Management & Coordination Services Pharmacy Team  Reason for Encounter: Hypertension  Contacted patient to discuss hypertension disease state. Unsuccessful outreach. Left voicemail for patient to return call.   Recent office visits:  [name], PA-C. Seen for hospital follow up visit for a fall. Will initiate pneumonia abx with Augmentin 875-125 mg PO BID x 7 days as well as Zpack  Will change prednisone taper to Prednisone 40 mg PO QD x 7 day burst to assist with breathing. 02/09/22-[name], DO (PCP) Seen for a cough and congestive heart failure. Labs ordered. Flu vaccine given. Follow up in 4 months.  01/15/22-[name], DO (PCP) Seen for congestive heart failure and a cough. Follow up in 4 weeks. 01/11/22-[name], DO (PCP) Seen for a cough and leg swelling. Will increase him to '60mg'$  on his lasix for the next 4 days and follow up on Monday. Likely will need to go back on his '40mg'$  of lasix daily.  01/01/22-[name], NP. Seen for upper respiratory infection. Ambulatory referral to Rheumatology. Chest x-ray ordered. Follow up in 1 week.  12/26/21-[name], PA-C. Seen for nasal congestion, sore throat and sinus drainage  Recent consult visits:  03/15/22-Manjula Magaraja (Pulmonology) Notes not available. 02/13/22-[name], DPM (Podiatry) Seen for a nail problem. Follow up in 3 months.   Hospital visits:  Medication Reconciliation was completed by comparing discharge summary, patient's EMR and Pharmacy list, and upon discussion with patient.   Admitted to the hospital on 03/02/22 due to a fall. Discharge date was 03/02/22. Discharged from [place]?Medications Started at [facility] Discharge:?? -started none noted   Medication Changes at Hospital Discharge: -Changed none noted   Medications Discontinued at Hospital Discharge: -Stopped none noted   Medications that remain the same after Hospital  Discharge:??  -All other medications will remain the same.     Medication Reconciliation was completed by comparing discharge summary, patient's EMR and Pharmacy list, and upon discussion with patient.   Admitted to the hospital on 02/24/22 due to SOB. Discharge date was 02/25/22. Discharged from [place]?Medications Started at [facility] Discharge:?? -started none noted   Medication Changes at Hospital Discharge: -Changed none noted   Medications Discontinued at Hospital Discharge: -Stopped none noted   Medications that remain the same after Hospital Discharge:??  -All other medications will remain the same.  Medications: Outpatient Encounter Medications as of 04/12/2022  Medication Sig   albuterol (PROVENTIL) (2.5 MG/3ML) 0.083% nebulizer solution Take 3 mLs (2.5 mg total) by nebulization every 6 (six) hours as needed for wheezing or shortness of breath.   albuterol (VENTOLIN HFA) 108 (90 Base) MCG/ACT inhaler Inhale 1 puff into the lungs every 4 (four) hours as needed for wheezing or shortness of breath.   Ascorbic Acid (VITAMIN C PO) Take 100 mg by mouth daily.   aspirin EC 81 MG tablet Take 81 mg by mouth daily.   azithromycin (ZITHROMAX) 250 MG tablet Take '500mg'$  PO daily x1d and then '250mg'$  daily x4 days   benzonatate (TESSALON PERLES) 100 MG capsule Take 1 capsule (100 mg total) by mouth 3 (three) times daily as needed for cough.   Calcium-Magnesium 100-50 MG TABS Take 1 tablet by mouth daily.   Cholecalciferol (VITAMIN D-1000 MAX ST) 25 MCG (1000 UT) tablet Take 1,000 Units by mouth daily.   dapagliflozin propanediol (FARXIGA) 5 MG TABS tablet Take 1 tablet (5 mg total)  by mouth daily.   diclofenac Sodium (VOLTAREN) 1 % GEL    ENTRESTO 49-51 MG Take 1 tablet by mouth 2 (two) times daily.   ezetimibe (ZETIA) 10 MG tablet Take 1 tablet (10 mg total) by mouth daily.   fluticasone (FLONASE) 50 MCG/ACT nasal spray Place 1 spray into both nostrils daily.    fluticasone furoate-vilanterol ([name] ELLIPTA) 100-25 MCG/ACT AEPB Inhale 1 puff into the lungs daily.   folic acid (FOLVITE) 1 MG tablet 1 tablet   furosemide (LASIX) 40 MG tablet Take 1 tablet (40 mg total) by mouth daily.   hydroxychloroquine (PLAQUENIL) 200 MG tablet Take 2 tablets (400 mg total) by mouth daily. Dx M06.9   isosorbide mononitrate (IMDUR) 30 MG 24 hr tablet TAKE 1/2 OF A TABLET (15 MG TOTAL) BY MOUTH DAILY   methotrexate (RHEUMATREX) 2.5 MG tablet Take 6 tablets (15 MG) by mouth weekly for Rheumatoid Arthritis. Dx M06.9   metoprolol succinate (TOPROL-XL) 100 MG 24 hr tablet TAKE 1 AND 1/2 TABLETS BY MOUTH DAILY WITH OR IMMEDIATELY FOLLOWING A MEAL   mexiletine (MEXITIL) 150 MG capsule Take 150 mg by mouth 2 (two) times daily.   nitroGLYCERIN (NITROSTAT) 0.4 MG SL tablet Place 1 tablet (0.4 mg total) under the tongue every 5 (five) minutes as needed for chest pain.   rivaroxaban (XARELTO) 20 MG TABS tablet Take by mouth.   rosuvastatin (CRESTOR) 20 MG tablet Take 1 tablet (20 mg total) by mouth at bedtime.   spironolactone (ALDACTONE) 25 MG tablet Take 1 tablet (25 mg total) by mouth daily.   Turmeric (QC TUMERIC COMPLEX PO) Take by mouth daily. Unsure of dose   vitamin B-12 (CYANOCOBALAMIN) 1000 MCG tablet Take by mouth daily.   No facility-administered encounter medications on file as of 04/12/2022.    Recent Office Vitals: BP Readings from Last 3 Encounters:  03/09/22 100/69  02/09/22 110/73  01/15/22 107/70   Pulse Readings from Last 3 Encounters:  03/09/22 85  02/09/22 86  01/15/22 87    Wt Readings from Last 3 Encounters:  03/09/22 170 lb 14.4 oz (77.5 kg)  02/09/22 176 lb 4.8 oz (80 kg)  01/15/22 173 lb 14.4 oz (78.9 kg)     Kidney Function Lab Results  Component Value Date/Time   CREATININE 0.75 (L) 01/11/2022 11:11 AM   CREATININE 0.89 11/27/2021 01:52 PM   GFRNONAA >60 01/12/2021 05:37 AM   GFRAA 96 04/11/2020 10:06 AM       Latest Ref Rng & Units  01/11/2022   11:11 AM 11/27/2021    1:52 PM 04/13/2021    8:29 AM  BMP  Glucose 70 - 99 mg/dL 124  105  148   BUN 8 - 27 mg/dL '15  14  18   '$ Creatinine 0.76 - 1.27 mg/dL 0.75  0.89  0.90   BUN/Creat Ratio 10 - '24 20  16  20   '$ Sodium 134 - 144 mmol/L 139  138  137   Potassium 3.5 - 5.2 mmol/L 4.4  4.4  4.8   Chloride 96 - 106 mmol/L 100  99  99   CO2 20 - 29 mmol/L '24  20  25   '$ Calcium 8.6 - 10.2 mg/dL 9.0  9.3  9.9     Current antihypertensive regimen:  Metoprolol 100 mg take 1 and 1/2 tab daily Furosemide 40 mg take 1 tab daily   Adherence Review: Is the patient currently on ACE/ARB medication? No Does the patient have >5 day gap between  last estimated fill dates? Yes  Star Rating Drugs:  Rosuvastatin 20 mg Last filled:11/27/21 90 DS, 10/29/21 90 DS  Myriam Elta Guadeloupe, RMA

## 2022-04-20 ENCOUNTER — Telehealth: Payer: Self-pay | Admitting: Family Medicine

## 2022-04-20 NOTE — Telephone Encounter (Unsigned)
[employer] Patient Assistance application and financial information was dropped off to the office. Patient was called to be informed that it was given to the wrong office and needs to be taken to Dr.End

## 2022-04-20 NOTE — Telephone Encounter (Signed)
Pt came to pick up the paperwork

## 2022-04-25 ENCOUNTER — Telehealth: Payer: Self-pay | Admitting: Family Medicine

## 2022-04-25 NOTE — Telephone Encounter (Signed)
Copied from [place]. Topic: Medicare AWV >> Apr 25, 2022  1:26 PM Devoria Glassing wrote: Reason for CRM: Left message for patient to schedule Annual Wellness Visit.  Please schedule with Health Nurse Advisor Kirke Shaggy at [facility].Call [place] at [phone]

## 2022-04-25 NOTE — Telephone Encounter (Signed)
Copied from [place]. Topic: Medicare AWV >> Apr 25, 2022  1:27 PM Devoria Glassing wrote:  Reason for CRM: Attempted to schedule AWV. Unable to LVM.  Will try at later time. MAILBOX FULL

## 2022-04-27 LAB — HM DIABETES EYE EXAM

## 2022-05-16 ENCOUNTER — Telehealth: Payer: Self-pay

## 2022-05-16 NOTE — Progress Notes (Cosign Needed)
Care Management & Coordination Services Pharmacy Team  Reason for Encounter: Hypertension  Contacted patient to discuss hypertension disease state. Unsuccessful outreach. Left voicemail for patient to return call.   Recent office visits:  [name], PA-C. Seen for hospital follow up visit for a fall. Will initiate pneumonia abx with Augmentin 875-125 mg PO BID x 7 days as well as Zpack  Will change prednisone taper to Prednisone 40 mg PO QD x 7 day burst to assist with breathing. 02/09/22-[name], DO (PCP) Seen for a cough and congestive heart failure. Labs ordered. Flu vaccine given. Follow up in 4 months.  01/15/22-[name], DO (PCP) Seen for congestive heart failure and a cough. Follow up in 4 weeks. 01/11/22-[name], DO (PCP) Seen for a cough and leg swelling. Will increase him to 60mg  on his lasix for the next 4 days and follow up on Monday. Likely will need to go back on his 40mg  of lasix daily.  01/01/22-[name], NP. Seen for upper respiratory infection. Ambulatory referral to Rheumatology. Chest x-ray ordered. Follow up in 1 week.  12/26/21-[name], PA-C. Seen for nasal congestion, sore throat and sinus drainage  Recent consult visits:  03/15/22-Manjula Magaraja (Pulmonology) Notes not available. 02/13/22-[name], DPM (Podiatry) Seen for a nail problem. Follow up in 3 months  Hospital visits:  Medication Reconciliation was completed by comparing discharge summary, patient's EMR and Pharmacy list, and upon discussion with patient.   Admitted to the hospital on 03/02/22 due to a fall. Discharge date was 03/02/22. Discharged from [place]?Medications Started at [facility] Discharge:?? -started none noted   Medication Changes at Hospital Discharge: -Changed none noted   Medications Discontinued at Hospital Discharge: -Stopped none noted   Medications that remain the same after Hospital Discharge:??   -All other medications will remain the same.     Medication Reconciliation was completed by comparing discharge summary, patient's EMR and Pharmacy list, and upon discussion with patient.   Admitted to the hospital on 02/24/22 due to SOB. Discharge date was 02/25/22. Discharged from [place]?Medications Started at [facility] Discharge:?? -started none noted   Medication Changes at Hospital Discharge: -Changed none noted   Medications Discontinued at Hospital Discharge: -Stopped none noted   Medications that remain the same after Hospital Discharge:??  -All other medications will remain the same  Medications: Outpatient Encounter Medications as of 05/16/2022  Medication Sig   albuterol (PROVENTIL) (2.5 MG/3ML) 0.083% nebulizer solution Take 3 mLs (2.5 mg total) by nebulization every 6 (six) hours as needed for wheezing or shortness of breath.   albuterol (VENTOLIN HFA) 108 (90 Base) MCG/ACT inhaler Inhale 1 puff into the lungs every 4 (four) hours as needed for wheezing or shortness of breath.   Ascorbic Acid (VITAMIN C PO) Take 100 mg by mouth daily.   aspirin EC 81 MG tablet Take 81 mg by mouth daily.   azithromycin (ZITHROMAX) 250 MG tablet Take 500mg  PO daily x1d and then 250mg  daily x4 days   benzonatate (TESSALON PERLES) 100 MG capsule Take 1 capsule (100 mg total) by mouth 3 (three) times daily as needed for cough.   Calcium-Magnesium 100-50 MG TABS Take 1 tablet by mouth daily.   Cholecalciferol (VITAMIN D-1000 MAX ST) 25 MCG (1000 UT) tablet Take 1,000 Units by mouth daily.   dapagliflozin propanediol (FARXIGA) 5 MG TABS tablet Take 1 tablet (5 mg total) by  mouth daily.   diclofenac Sodium (VOLTAREN) 1 % GEL    ENTRESTO 49-51 MG Take 1 tablet by mouth 2 (two) times daily.   ezetimibe (ZETIA) 10 MG tablet Take 1 tablet (10 mg total) by mouth daily.   fluticasone (FLONASE) 50 MCG/ACT nasal spray Place 1 spray into both nostrils daily.   fluticasone  furoate-vilanterol ([name] ELLIPTA) 100-25 MCG/ACT AEPB Inhale 1 puff into the lungs daily.   folic acid (FOLVITE) 1 MG tablet 1 tablet   furosemide (LASIX) 40 MG tablet Take 1 tablet (40 mg total) by mouth daily.   hydroxychloroquine (PLAQUENIL) 200 MG tablet Take 2 tablets (400 mg total) by mouth daily. Dx M06.9   isosorbide mononitrate (IMDUR) 30 MG 24 hr tablet TAKE 1/2 OF A TABLET (15 MG TOTAL) BY MOUTH DAILY   methotrexate (RHEUMATREX) 2.5 MG tablet Take 6 tablets (15 MG) by mouth weekly for Rheumatoid Arthritis. Dx M06.9   metoprolol succinate (TOPROL-XL) 100 MG 24 hr tablet TAKE 1 AND 1/2 TABLETS BY MOUTH DAILY WITH OR IMMEDIATELY FOLLOWING A MEAL   mexiletine (MEXITIL) 150 MG capsule Take 150 mg by mouth 2 (two) times daily.   nitroGLYCERIN (NITROSTAT) 0.4 MG SL tablet Place 1 tablet (0.4 mg total) under the tongue every 5 (five) minutes as needed for chest pain.   rivaroxaban (XARELTO) 20 MG TABS tablet Take by mouth.   rosuvastatin (CRESTOR) 20 MG tablet Take 1 tablet (20 mg total) by mouth at bedtime.   spironolactone (ALDACTONE) 25 MG tablet Take 1 tablet (25 mg total) by mouth daily.   Turmeric (QC TUMERIC COMPLEX PO) Take by mouth daily. Unsure of dose   vitamin B-12 (CYANOCOBALAMIN) 1000 MCG tablet Take by mouth daily.   No facility-administered encounter medications on file as of 05/16/2022.    Recent Office Vitals: BP Readings from Last 3 Encounters:  03/09/22 100/69  02/09/22 110/73  01/15/22 107/70   Pulse Readings from Last 3 Encounters:  03/09/22 85  02/09/22 86  01/15/22 87    Wt Readings from Last 3 Encounters:  03/09/22 170 lb 14.4 oz (77.5 kg)  02/09/22 176 lb 4.8 oz (80 kg)  01/15/22 173 lb 14.4 oz (78.9 kg)     Kidney Function Lab Results  Component Value Date/Time   CREATININE 0.75 (L) 01/11/2022 11:11 AM   CREATININE 0.89 11/27/2021 01:52 PM   GFRNONAA >60 01/12/2021 05:37 AM   GFRAA 96 04/11/2020 10:06 AM       Latest Ref Rng & Units 01/11/2022    11:11 AM 11/27/2021    1:52 PM 04/13/2021    8:29 AM  BMP  Glucose 70 - 99 mg/dL 124  105  148   BUN 8 - 27 mg/dL 15  14  18    Creatinine 0.76 - 1.27 mg/dL 0.75  0.89  0.90   BUN/Creat Ratio 10 - 24 20  16  20    Sodium 134 - 144 mmol/L 139  138  137   Potassium 3.5 - 5.2 mmol/L 4.4  4.4  4.8   Chloride 96 - 106 mmol/L 100  99  99   CO2 20 - 29 mmol/L 24  20  25    Calcium 8.6 - 10.2 mg/dL 9.0  9.3  9.9    Current antihypertensive regimen:  Metoprolol 100 mg take 1 and 1/2 tab daily Furosemide 40 mg take 1 tab dail   Adherence Review: Is the patient currently on ACE/ARB medication? No Does the patient have >5 day gap between last estimated  fill dates? Yes  Star Rating Drugs:  Rosuvastatin 20 mg Last filled:11/27/21 90 DS, 10/29/21 90 DS    Myriam Elta Guadeloupe, RMA

## 2022-05-17 ENCOUNTER — Encounter: Payer: Self-pay | Admitting: Podiatry

## 2022-05-17 ENCOUNTER — Ambulatory Visit (INDEPENDENT_AMBULATORY_CARE_PROVIDER_SITE_OTHER): Payer: Medicare Other | Admitting: Podiatry

## 2022-05-17 DIAGNOSIS — M79675 Pain in left toe(s): Secondary | ICD-10-CM

## 2022-05-17 DIAGNOSIS — B351 Tinea unguium: Secondary | ICD-10-CM | POA: Diagnosis not present

## 2022-05-17 DIAGNOSIS — E1159 Type 2 diabetes mellitus with other circulatory complications: Secondary | ICD-10-CM | POA: Diagnosis not present

## 2022-05-17 DIAGNOSIS — M79674 Pain in right toe(s): Secondary | ICD-10-CM | POA: Diagnosis not present

## 2022-05-17 NOTE — Progress Notes (Signed)
This patient returns to my office for at risk foot care.  This patient requires this care by a professional since this patient will be at risk due to having diabetes.  This patient is unable to cut nails himself since the patient cannot reach his nails.These nails are painful walking and wearing shoes.  This patient presents for at risk foot care today.  General Appearance  Alert, conversant and in no acute stress.  Vascular  Dorsalis pedis and posterior tibial  pulses are palpable  bilaterally.  Capillary return is within normal limits  bilaterally. Temperature is within normal limits  bilaterally.  Neurologic  Senn-Weinstein monofilament wire test diminished   bilaterally. Muscle power diminished bilaterally.  Nails Thick disfigured discolored nails with subungual debris  from hallux to fifth toes bilaterally. No evidence of bacterial infection or drainage bilaterally.  Orthopedic  No limitations of motion  feet .  No crepitus or effusions noted.  No bony pathology or digital deformities noted.  Skin  normotropic skin with no porokeratosis noted bilaterally.  No signs of infections or ulcers noted.     Onychomycosis  Pain in right toes  Pain in left toes  Consent was obtained for treatment procedures.   Mechanical debridement of nails 1-5  bilaterally performed with a nail nipper.  Filed with dremel without incident.    Return office visit   3 months                  Told patient to return for periodic foot care and evaluation due to potential at risk complications.   Gardiner Barefoot DPM

## 2022-05-28 ENCOUNTER — Ambulatory Visit (INDEPENDENT_AMBULATORY_CARE_PROVIDER_SITE_OTHER): Payer: Medicare Other | Admitting: Family Medicine

## 2022-05-28 ENCOUNTER — Encounter: Payer: Self-pay | Admitting: Family Medicine

## 2022-05-28 VITALS — BP 109/70 | HR 86 | Temp 97.6°F | Ht 64.0 in | Wt 172.0 lb

## 2022-05-28 DIAGNOSIS — R3911 Hesitancy of micturition: Secondary | ICD-10-CM

## 2022-05-28 DIAGNOSIS — I25118 Atherosclerotic heart disease of native coronary artery with other forms of angina pectoris: Secondary | ICD-10-CM

## 2022-05-28 DIAGNOSIS — Z7984 Long term (current) use of oral hypoglycemic drugs: Secondary | ICD-10-CM

## 2022-05-28 DIAGNOSIS — I10 Essential (primary) hypertension: Secondary | ICD-10-CM | POA: Diagnosis not present

## 2022-05-28 DIAGNOSIS — J849 Interstitial pulmonary disease, unspecified: Secondary | ICD-10-CM

## 2022-05-28 DIAGNOSIS — I509 Heart failure, unspecified: Secondary | ICD-10-CM

## 2022-05-28 DIAGNOSIS — I209 Angina pectoris, unspecified: Secondary | ICD-10-CM

## 2022-05-28 DIAGNOSIS — E1159 Type 2 diabetes mellitus with other circulatory complications: Secondary | ICD-10-CM

## 2022-05-28 DIAGNOSIS — E1169 Type 2 diabetes mellitus with other specified complication: Secondary | ICD-10-CM | POA: Diagnosis not present

## 2022-05-28 DIAGNOSIS — G4733 Obstructive sleep apnea (adult) (pediatric): Secondary | ICD-10-CM

## 2022-05-28 DIAGNOSIS — E038 Other specified hypothyroidism: Secondary | ICD-10-CM

## 2022-05-28 DIAGNOSIS — Z0000 Encounter for general adult medical examination without abnormal findings: Secondary | ICD-10-CM

## 2022-05-28 DIAGNOSIS — M069 Rheumatoid arthritis, unspecified: Secondary | ICD-10-CM

## 2022-05-28 DIAGNOSIS — D692 Other nonthrombocytopenic purpura: Secondary | ICD-10-CM

## 2022-05-28 DIAGNOSIS — N393 Stress incontinence (female) (male): Secondary | ICD-10-CM

## 2022-05-28 LAB — URINALYSIS, ROUTINE W REFLEX MICROSCOPIC
Bilirubin, UA: NEGATIVE
Ketones, UA: NEGATIVE
Leukocytes,UA: NEGATIVE
Nitrite, UA: NEGATIVE
Protein,UA: NEGATIVE
RBC, UA: NEGATIVE
Specific Gravity, UA: 1.01 (ref 1.005–1.030)
Urobilinogen, Ur: 0.2 mg/dL (ref 0.2–1.0)
pH, UA: 5 (ref 5.0–7.5)

## 2022-05-28 LAB — MICROALBUMIN, URINE WAIVED
Creatinine, Urine Waived: 10 mg/dL (ref 10–300)
Microalb, Ur Waived: 10 mg/L (ref 0–19)

## 2022-05-28 LAB — BAYER DCA HB A1C WAIVED: HB A1C (BAYER DCA - WAIVED): 7.2 % — ABNORMAL HIGH (ref 4.8–5.6)

## 2022-05-28 MED ORDER — FUROSEMIDE 40 MG PO TABS: 40.0000 mg | ORAL_TABLET | Freq: Every day | ORAL | 1 refills | Status: DC

## 2022-05-28 MED ORDER — DAPAGLIFLOZIN PROPANEDIOL 5 MG PO TABS: 5.0000 mg | ORAL_TABLET | Freq: Every day | ORAL | 1 refills | Status: DC

## 2022-05-28 MED ORDER — SPIRONOLACTONE 25 MG PO TABS: 25.0000 mg | ORAL_TABLET | Freq: Every day | ORAL | 1 refills | Status: DC

## 2022-05-28 MED ORDER — ALBUTEROL SULFATE HFA 108 (90 BASE) MCG/ACT IN AERS: 1.0000 | INHALATION_SPRAY | RESPIRATORY_TRACT | 3 refills | Status: DC | PRN

## 2022-05-28 MED ORDER — ROSUVASTATIN CALCIUM 20 MG PO TABS: 20.0000 mg | ORAL_TABLET | Freq: Every day | ORAL | 1 refills | Status: DC

## 2022-05-28 MED ORDER — ISOSORBIDE MONONITRATE ER 30 MG PO TB24: ORAL_TABLET | ORAL | 1 refills | Status: DC

## 2022-05-28 MED ORDER — ALBUTEROL SULFATE (2.5 MG/3ML) 0.083% IN NEBU: 2.5000 mg | INHALATION_SOLUTION | Freq: Four times a day (QID) | RESPIRATORY_TRACT | 1 refills | Status: DC | PRN

## 2022-05-28 MED ORDER — METOPROLOL SUCCINATE ER 100 MG PO TB24: ORAL_TABLET | ORAL | 1 refills | Status: DC

## 2022-05-28 MED ORDER — EZETIMIBE 10 MG PO TABS: 10.0000 mg | ORAL_TABLET | Freq: Every day | ORAL | 1 refills | Status: DC

## 2022-05-28 NOTE — Assessment & Plan Note (Signed)
Continue to follow with cardiology. Call with any concerns. Continue to monitor.  

## 2022-05-28 NOTE — Assessment & Plan Note (Signed)
Under good control on current regimen. Continue current regimen. Continue to monitor. Call with any concerns. Refills given. Labs drawn today.   

## 2022-05-28 NOTE — Assessment & Plan Note (Signed)
Stable with A1c of 7.2. Continue to follow with cardiology and endocrinology. Call with any concerns. Continue to monitor.

## 2022-05-28 NOTE — Assessment & Plan Note (Signed)
Under good control on current regimen. Tolerating his CPAP well. No concerns.

## 2022-05-28 NOTE — Patient Instructions (Signed)
Preventative Services:  Health Risk Assessment and Personalized Prevention Plan: Done today Bone Mass Measurements: N/A CVD Screening: Done today Colon Cancer Screening: N/A Depression Screening: Done today Diabetes Screening: Done  Glaucoma Screening: See your eye doctor Hepatitis B vaccine: N/A Hepatitis C screening: up to date HIV Screening: up to date Flu Vaccine: up to date Lung cancer Screening: N/A Obesity Screening: Done today Pneumonia Vaccines (2): Up to date STI Screening: N/a PSA screening: Done today

## 2022-05-28 NOTE — Assessment & Plan Note (Signed)
Continue to follow with rheumatology. Call with any concerns. Continue to monitor.  

## 2022-05-28 NOTE — Assessment & Plan Note (Signed)
Labs drawn today. Await results. Treat as needed.  

## 2022-05-28 NOTE — Progress Notes (Signed)
BP 109/70   Pulse 86   Temp 97.6 F (36.4 C) (Oral)   Ht 5\' 4"  (1.626 m)   Wt 172 lb (78 kg)   SpO2 95%   BMI 29.52 kg/m    Subjective:    Patient ID: Samuel Morris, male    DOB: 1945/12/16, 77 y.o.   MRN: [id number]  HPI: Samuel Morris is a 77 y.o. male presenting on 05/28/2022 for comprehensive medical examination. Current medical complaints include:  Continues to follow with rheumatology- continues to have issues with his joints.   DIABETES Hypoglycemic episodes:no Polydipsia/polyuria: no Visual disturbance: no Chest pain: no Paresthesias: no Glucose Monitoring: no  Accucheck frequency: Not Checking Taking Insulin?: no Blood Pressure Monitoring: rarely Retinal Examination: Up to Date Foot Exam: Up to Date Diabetic Education: Completed Pneumovax: Up to Date Influenza: Up to Date Aspirin: yes  HYPERTENSION / HYPERLIPIDEMIA Satisfied with current treatment? yes Duration of hypertension: chronic BP monitoring frequency: not checking BP medication side effects: no Duration of hyperlipidemia: chronic Cholesterol medication side effects: no Cholesterol supplements: none Past cholesterol medications: zetia, crestor Medication compliance: excellent compliance Aspirin: yes Recent stressors: no Recurrent headaches: no Visual changes: no Palpitations: no Dyspnea: no Chest pain: no Lower extremity edema: yes Dizzy/lightheaded: no  He currently lives with: wife and grandson Interim Problems from his last visit: no  Functional Status Survey: Is the patient deaf or have difficulty hearing?: No Does the patient have difficulty seeing, even when wearing glasses/contacts?: No Does the patient have difficulty concentrating, remembering, or making decisions?: No Does the patient have difficulty walking or climbing stairs?: No Does the patient have difficulty dressing or bathing?: No Does the patient have difficulty doing errands alone such as visiting a doctor's office or  shopping?: No  FALL RISK:    05/28/2022    8:59 AM 03/07/2022    1:06 PM 02/09/2022   10:53 AM 12/26/2021    9:35 AM 08/15/2021    2:57 PM  [place] in the past year? 0 1 0 0 0  Number falls in past yr: 0 0 0 0 0  Injury with Fall? 0  0 0 0  Risk for fall due to : No Fall Risks History of fall(s);Impaired mobility No Fall Risks No Fall Risks No Fall Risks  Follow up Falls evaluation completed Follow up appointment Falls evaluation completed Falls evaluation completed Falls evaluation completed  Comment  Requested appointment with PCP       Depression Screen    05/28/2022    8:59 AM 02/09/2022   10:52 AM 12/26/2021    9:35 AM 08/15/2021    2:57 PM 04/13/2021    8:28 AM  Depression screen PHQ 2/9  Decreased Interest 0 0 0 0 0  Down, Depressed, Hopeless 0 0 0 0 0  PHQ - 2 Score 0 0 0 0 0  Altered sleeping 0 0 0 0 0  Tired, decreased energy 0 0 0 0 0  Change in appetite 0 0 0 0 0  Feeling bad or failure about yourself  0 0 0 0 0  Trouble concentrating 0 0 0 0 0  Moving slowly or fidgety/restless 0 0 0 0 0  Suicidal thoughts 0 0 0 0 0  PHQ-9 Score 0 0 0 0 0  Difficult doing work/chores Not difficult at all Not difficult at all Not difficult at all Not difficult at all    Advanced Directives Does patient have a HCPOA?    yes Does  patient have a living will or MOST form?  yes  Past Medical History:  Past Medical History:  Diagnosis Date   Arrhythmia    paroxysmal atrial fibrillation   Cancer (HCC)    Scalp   Cataract    CHF (congestive heart failure) ([place])    COVID-19 11/16/2020   Diabetes mellitus without complication (HCC)    Heart attack ([place])    Heart disease    Hyperlipidemia    Hypertension    Sleep apnea    Stenosis of aorta    Ventricular tachycardia ([place])     Surgical History:  Past Surgical History:  Procedure Laterality Date   [place] CATH AND CORS/GRAFTS  ANGIOGRAPHY N/A 01/10/2021   Procedure: LEFT HEART CATH AND CORS/GRAFTS ANGIOGRAPHY;  Surgeon: Nelva Bush, MD;  Location: [facility];  Service: Cardiovascular;  Laterality: N/A;   SKIN CANCER EXCISION      Medications:  Current Outpatient Medications on File Prior to Visit  Medication Sig   Ascorbic Acid (VITAMIN C PO) Take 100 mg by mouth daily.   aspirin EC 81 MG tablet Take 81 mg by mouth daily.   Calcium-Magnesium 100-50 MG TABS Take 1 tablet by mouth daily.   Cholecalciferol (VITAMIN D-1000 MAX ST) 25 MCG (1000 UT) tablet Take 1,000 Units by mouth daily.   ENTRESTO 49-51 MG Take 1 tablet by mouth 2 (two) times daily.   fluticasone (FLONASE) 50 MCG/ACT nasal spray Place 1 spray into both nostrils daily.   folic acid (FOLVITE) 1 MG tablet 1 tablet   hydroxychloroquine (PLAQUENIL) 200 MG tablet Take 2 tablets (400 mg total) by mouth daily. Dx M06.9   methotrexate (RHEUMATREX) 2.5 MG tablet Take 6 tablets (15 MG) by mouth weekly for Rheumatoid Arthritis. Dx M06.9   mexiletine (MEXITIL) 150 MG capsule Take 150 mg by mouth 2 (two) times daily.   nitroGLYCERIN (NITROSTAT) 0.4 MG SL tablet Place 1 tablet (0.4 mg total) under the tongue every 5 (five) minutes as needed for chest pain.   rivaroxaban (XARELTO) 20 MG TABS tablet Take by mouth.   Turmeric (QC TUMERIC COMPLEX PO) Take by mouth daily. Unsure of dose   vitamin B-12 (CYANOCOBALAMIN) 1000 MCG tablet Take by mouth daily.   No current facility-administered medications on file prior to visit.    Allergies:  No Known Allergies  Social History:  Social History   Socioeconomic History   Marital status: Married    Spouse name: Not on file   Number of children: Not on file   Years of education: Not on file   Highest education level: Not on file  Occupational History   Occupation: retired  Tobacco Use   Smoking status: Never   Smokeless tobacco: Never  Vaping Use   Vaping Use: Never used  Substance and Sexual  Activity   Alcohol use: Yes    Comment: On occasion   Drug use: Never   Sexual activity: Yes    Birth control/protection: None  Other Topics Concern   Not on file  Social History Narrative   Not on file   Social Determinants of Health   Financial Resource Strain: High Risk (12/18/2021)   Overall Financial Resource Strain (CARDIA)    Difficulty of Paying Living Expenses: Very hard  Food Insecurity: No Food Insecurity (04/03/2021)   Hunger Vital Sign    Worried About Running Out of Food  in the Last Year: Never true    [place] in the Last Year: Never true  Transportation Needs: No Transportation Needs (03/07/2022)   PRAPARE - Hydrologist (Medical): No    Lack of Transportation (Non-Medical): No  Physical Activity: Sufficiently Active (04/03/2021)   Exercise Vital Sign    Days of Exercise per Week: 4 days    Minutes of Exercise per Session: 40 min  Stress: No Stress Concern Present (04/03/2021)   [place]    Feeling of Stress : Not at all  Social Connections: Moderately Integrated (04/03/2021)   Social Connection and Isolation Panel [NHANES]    Frequency of Communication with Friends and Family: More than three times a week    Frequency of Social Gatherings with Friends and Family: Twice a week    Attends Religious Services: More than 4 times per year    Active Member of [employer] or Organizations: No    Attends Archivist Meetings: Never    Marital Status: Married  Human resources officer Violence: Not At Risk (04/03/2021)   Humiliation, Afraid, Rape, and Kick questionnaire    Fear of Current or Ex-Partner: No    Emotionally Abused: No    Physically Abused: No    Sexually Abused: No   Social History   Tobacco Use  Smoking Status Never  Smokeless Tobacco Never   Social History   Substance and Sexual Activity  Alcohol Use Yes   Comment: On occasion    Family History:   Family History  Problem Relation Age of Onset   Heart disease Mother    Heart attack Father    Arthritis Sister    Hypertension Sister    Heart murmur Sister    Hypertension Brother    Prostate cancer Brother    Heart disease Paternal Grandmother     Past medical history, surgical history, medications, allergies, family history and social history reviewed with patient today and changes made to appropriate areas of the chart.   Review of Systems  Constitutional: Negative.   HENT:  Positive for congestion. Negative for ear discharge, ear pain, hearing loss, nosebleeds, sinus pain, sore throat and tinnitus.   Eyes: Negative.   Respiratory: Negative.  Negative for stridor.   Cardiovascular:  Positive for leg swelling. Negative for chest pain, palpitations, orthopnea, claudication and PND.  Gastrointestinal: Negative.   Genitourinary: Negative.        + incontinence  Musculoskeletal:  Positive for joint pain. Negative for back pain, falls, myalgias and neck pain.  Skin: Negative.   Neurological:  Positive for tingling. Negative for dizziness, tremors, sensory change, speech change, focal weakness, seizures, loss of consciousness, weakness and headaches.  Endo/Heme/Allergies:  Positive for polydipsia. Negative for environmental allergies. Bruises/bleeds easily.  Psychiatric/Behavioral: Negative.     All other ROS negative except what is listed above and in the HPI.      Objective:    BP 109/70   Pulse 86   Temp 97.6 F (36.4 C) (Oral)   Ht 5\' 4"  (1.626 m)   Wt 172 lb (78 kg)   SpO2 95%   BMI 29.52 kg/m   Wt Readings from Last 3 Encounters:  05/28/22 172 lb (78 kg)  03/09/22 170 lb 14.4 oz (77.5 kg)  02/09/22 176 lb 4.8 oz (80 kg)    Physical Exam Vitals and nursing note reviewed.  Constitutional:      General: He is  not in acute distress.    Appearance: Normal appearance. He is not ill-appearing, toxic-appearing or diaphoretic.  HENT:     Head: Normocephalic and  atraumatic.     Right Ear: Tympanic membrane, ear canal and external ear normal. There is no impacted cerumen.     Left Ear: Tympanic membrane, ear canal and external ear normal. There is no impacted cerumen.     Nose: Nose normal. No congestion or rhinorrhea.     Mouth/Throat:     Mouth: Mucous membranes are moist.     Pharynx: Oropharynx is clear. No oropharyngeal exudate or posterior oropharyngeal erythema.  Eyes:     General: No scleral icterus.       Right eye: No discharge.        Left eye: No discharge.     Extraocular Movements: Extraocular movements intact.     Conjunctiva/sclera: Conjunctivae normal.     Pupils: Pupils are equal, round, and reactive to light.  Neck:     Vascular: No carotid bruit.  Cardiovascular:     Rate and Rhythm: Normal rate and regular rhythm.     Pulses: Normal pulses.     Heart sounds: Murmur heard.     No friction rub. No gallop.  Pulmonary:     Effort: Pulmonary effort is normal. No respiratory distress.     Breath sounds: Normal breath sounds. No stridor. No wheezing, rhonchi or rales.  Chest:     Chest wall: No tenderness.  Abdominal:     General: Abdomen is flat. Bowel sounds are normal. There is no distension.     Palpations: Abdomen is soft. There is no mass.     Tenderness: There is no abdominal tenderness. There is no right CVA tenderness, left CVA tenderness, guarding or rebound.     Hernia: No hernia is present.  Genitourinary:    Comments: Genital exam deferred with shared decision making Musculoskeletal:        General: No swelling, tenderness, deformity or signs of injury.     Cervical back: Normal range of motion and neck supple. No rigidity. No muscular tenderness.     Right lower leg: No edema.     Left lower leg: No edema.  Lymphadenopathy:     Cervical: No cervical adenopathy.  Skin:    General: Skin is warm and dry.     Capillary Refill: Capillary refill takes less than 2 seconds.     Coloration: Skin is not jaundiced  or pale.     Findings: No bruising, erythema, lesion or rash.  Neurological:     General: No focal deficit present.     Mental Status: He is alert and oriented to person, place, and time.     Cranial Nerves: No cranial nerve deficit.     Sensory: No sensory deficit.     Motor: No weakness.     Coordination: Coordination normal.     Gait: Gait normal.     Deep Tendon Reflexes: Reflexes normal.  Psychiatric:        Mood and Affect: Mood normal.        Behavior: Behavior normal.        Thought Content: Thought content normal.        Judgment: Judgment normal.        05/28/2022    9:31 AM 04/01/2020    9:07 AM  6CIT Screen  What Year? 0 points 0 points  What month? 0 points 0 points  What time? 0 points  0 points  Count back from 20 0 points 0 points  Months in reverse 0 points 0 points  Repeat phrase 2 points 2 points  Total Score 2 points 2 points    Results for orders placed or performed in visit on 05/28/22  Urinalysis, Routine w reflex microscopic  Result Value Ref Range   Specific Gravity, UA 1.010 1.005 - 1.030   pH, UA 5.0 5.0 - 7.5   Color, UA Yellow Yellow   Appearance Ur Clear Clear   Leukocytes,UA Negative Negative   Protein,UA Negative Negative/Trace   Glucose, UA 2+ (A) Negative   Ketones, UA Negative Negative   RBC, UA Negative Negative   Bilirubin, UA Negative Negative   Urobilinogen, Ur 0.2 0.2 - 1.0 mg/dL   Nitrite, UA Negative Negative   Microscopic Examination Comment   Microalbumin, Urine Waived  Result Value Ref Range   Microalb, Ur Waived 10 0 - 19 mg/L   Creatinine, Urine Waived 10 10 - 300 mg/dL   Microalb/Creat Ratio 30-300 (H) <30 mg/g  Bayer DCA Hb A1c Waived  Result Value Ref Range   HB A1C (BAYER DCA - WAIVED) 7.2 (H) 4.8 - 5.6 %      Assessment & Plan:   Problem List Items Addressed This Visit       Cardiovascular and Mediastinum   CAD (coronary artery disease)    Continue to follow with cardiology. Call with any concerns.  Continue to monitor.       Relevant Medications   ezetimibe (ZETIA) 10 MG tablet   furosemide (LASIX) 40 MG tablet   isosorbide mononitrate (IMDUR) 30 MG 24 hr tablet   metoprolol succinate (TOPROL-XL) 100 MG 24 hr tablet   rosuvastatin (CRESTOR) 20 MG tablet   spironolactone (ALDACTONE) 25 MG tablet   Type 2 diabetes mellitus with cardiac complication (HCC)    Stable with A1c of 7.2. Continue to follow with cardiology and endocrinology. Call with any concerns. Continue to monitor.       Relevant Medications   dapagliflozin propanediol (FARXIGA) 5 MG TABS tablet   ezetimibe (ZETIA) 10 MG tablet   furosemide (LASIX) 40 MG tablet   isosorbide mononitrate (IMDUR) 30 MG 24 hr tablet   metoprolol succinate (TOPROL-XL) 100 MG 24 hr tablet   rosuvastatin (CRESTOR) 20 MG tablet   spironolactone (ALDACTONE) 25 MG tablet   Other Relevant Orders   Comprehensive metabolic panel   CBC with Differential/Platelet   Urinalysis, Routine w reflex microscopic (Completed)   Microalbumin, Urine Waived (Completed)   Bayer DCA Hb A1c Waived (Completed)   HTN (hypertension)    Under good control on current regimen. Continue current regimen. Continue to monitor. Call with any concerns. Refills given. Labs drawn today.        Relevant Medications   ezetimibe (ZETIA) 10 MG tablet   furosemide (LASIX) 40 MG tablet   isosorbide mononitrate (IMDUR) 30 MG 24 hr tablet   metoprolol succinate (TOPROL-XL) 100 MG 24 hr tablet   rosuvastatin (CRESTOR) 20 MG tablet   spironolactone (ALDACTONE) 25 MG tablet   Other Relevant Orders   Comprehensive metabolic panel   CBC with Differential/Platelet   Urinalysis, Routine w reflex microscopic (Completed)   Microalbumin, Urine Waived (Completed)   Angina pectoris ([place])    Continue to follow with cardiology. Call with any concerns. Continue to monitor.       Relevant Medications   ezetimibe (ZETIA) 10 MG tablet   furosemide (LASIX) 40 MG tablet   isosorbide  mononitrate (IMDUR) 30 MG 24 hr tablet   metoprolol succinate (TOPROL-XL) 100 MG 24 hr tablet   rosuvastatin (CRESTOR) 20 MG tablet   spironolactone (ALDACTONE) 25 MG tablet   Senile purpura ([place])    Reassured patient. Continue to monitor.      Relevant Medications   ezetimibe (ZETIA) 10 MG tablet   furosemide (LASIX) 40 MG tablet   isosorbide mononitrate (IMDUR) 30 MG 24 hr tablet   metoprolol succinate (TOPROL-XL) 100 MG 24 hr tablet   rosuvastatin (CRESTOR) 20 MG tablet   spironolactone (ALDACTONE) 25 MG tablet   Chronic congestive heart failure (HCC)    Continue to follow with cardiology. Call with any concerns. Continue to monitor.       Relevant Medications   ezetimibe (ZETIA) 10 MG tablet   furosemide (LASIX) 40 MG tablet   isosorbide mononitrate (IMDUR) 30 MG 24 hr tablet   metoprolol succinate (TOPROL-XL) 100 MG 24 hr tablet   rosuvastatin (CRESTOR) 20 MG tablet   spironolactone (ALDACTONE) 25 MG tablet     Respiratory   OSA (obstructive sleep apnea)    Under good control on current regimen. Tolerating his CPAP well. No concerns.        Interstitial lung disease ([place])    Continue to follow with pulmonology. Call with any concerns. Continue to monitor.       Relevant Medications   albuterol (PROVENTIL) (2.5 MG/3ML) 0.083% nebulizer solution     Endocrine   Hyperlipidemia associated with type 2 diabetes mellitus ([place])    Under good control on current regimen. Continue current regimen. Continue to monitor. Call with any concerns. Refills given. Labs drawn today.       Relevant Medications   dapagliflozin propanediol (FARXIGA) 5 MG TABS tablet   ezetimibe (ZETIA) 10 MG tablet   furosemide (LASIX) 40 MG tablet   isosorbide mononitrate (IMDUR) 30 MG 24 hr tablet   metoprolol succinate (TOPROL-XL) 100 MG 24 hr tablet   rosuvastatin (CRESTOR) 20 MG tablet   spironolactone (ALDACTONE) 25 MG tablet   Other Relevant Orders   Comprehensive metabolic panel   CBC  with Differential/Platelet   Lipid Panel w/o Chol/HDL Ratio   Subclinical hypothyroidism    Labs drawn today. Await results. Treat as needed.       Relevant Medications   metoprolol succinate (TOPROL-XL) 100 MG 24 hr tablet   Other Relevant Orders   Comprehensive metabolic panel   CBC with Differential/Platelet   TSH     Musculoskeletal and Integument   Rheumatoid arthritis ([place])    Continue to follow with rheumatology. Call with any concerns. Continue to monitor.       Other Visit Diagnoses     Encounter for Medicare annual wellness exam    -  Primary   Preventative care discussed today as below.   Stress incontinence       Would like to see urology. Referral to urology placed today.   Relevant Orders   Ambulatory referral to Urology   Hesitancy       Labs drawn today. Await results.   Relevant Orders   PSA        Preventative Services:  Health Risk Assessment and Personalized Prevention Plan: Done today Bone Mass Measurements: N/A CVD Screening: Done today Colon Cancer Screening: N/A Depression Screening: Done today Diabetes Screening: Done  Glaucoma Screening: See your eye doctor Hepatitis B vaccine: N/A Hepatitis C screening: up to date HIV Screening: up to date Flu Vaccine: up to date  Lung cancer Screening: N/A Obesity Screening: Done today Pneumonia Vaccines (2): Up to date STI Screening: N/a PSA screening: Done today  Discussed aspirin prophylaxis for myocardial infarction prevention and decision was made to continue ASA  LABORATORY TESTING:  Health maintenance labs ordered today as discussed above.   The natural history of prostate cancer and ongoing controversy regarding screening and potential treatment outcomes of prostate cancer has been discussed with the patient. The meaning of a false positive PSA and a false negative PSA has been discussed. He indicates understanding of the limitations of this screening test and wishes to proceed with  screening PSA testing.   IMMUNIZATIONS:   - Tdap: Tetanus vaccination status reviewed: last tetanus booster within 10 years. - Influenza: Up to date - Pneumovax: Up to date - Prevnar: Up to date - Zostavax vaccine: Refused  PATIENT COUNSELING:    Sexuality: Discussed sexually transmitted diseases, partner selection, use of condoms, avoidance of unintended pregnancy  and contraceptive alternatives.   Advised to avoid cigarette smoking.  I discussed with the patient that most people either abstain from alcohol or drink within safe limits (<=14/week and <=4 drinks/occasion for males, <=7/weeks and <= 3 drinks/occasion for females) and that the risk for alcohol disorders and other health effects rises proportionally with the number of drinks per week and how often a drinker exceeds daily limits.  Discussed cessation/primary prevention of drug use and availability of treatment for abuse.   Diet: Encouraged to adjust caloric intake to maintain  or achieve ideal body weight, to reduce intake of dietary saturated fat and total fat, to limit sodium intake by avoiding high sodium foods and not adding table salt, and to maintain adequate dietary potassium and calcium preferably from fresh fruits, vegetables, and low-fat dairy products.    stressed the importance of regular exercise  Injury prevention: Discussed safety belts, safety helmets, smoke detector, smoking near bedding or upholstery.   Dental health: Discussed importance of regular tooth brushing, flossing, and dental visits.   Follow up plan: NEXT PREVENTATIVE PHYSICAL DUE IN 1 YEAR. Return in about 6 months (around 11/28/2022).

## 2022-05-28 NOTE — Assessment & Plan Note (Signed)
Reassured patient. Continue to monitor.  

## 2022-05-28 NOTE — Assessment & Plan Note (Signed)
Continue to follow with pulmonology. Call with any concerns. Continue to monitor.  

## 2022-05-29 LAB — CBC WITH DIFFERENTIAL/PLATELET
Basophils Absolute: 0 10*3/uL (ref 0.0–0.2)
Basos: 0 %
EOS (ABSOLUTE): 0 10*3/uL (ref 0.0–0.4)
Eos: 0 %
Hematocrit: 42.9 % (ref 37.5–51.0)
Hemoglobin: 13.9 g/dL (ref 13.0–17.7)
Immature Grans (Abs): 0.1 10*3/uL (ref 0.0–0.1)
Immature Granulocytes: 1 %
Lymphocytes Absolute: 1 10*3/uL (ref 0.7–3.1)
Lymphs: 11 %
MCH: 28.5 pg (ref 26.6–33.0)
MCHC: 32.4 g/dL (ref 31.5–35.7)
MCV: 88 fL (ref 79–97)
Monocytes Absolute: 0.5 10*3/uL (ref 0.1–0.9)
Monocytes: 5 %
Neutrophils Absolute: 7.5 10*3/uL — ABNORMAL HIGH (ref 1.4–7.0)
Neutrophils: 83 %
Platelets: 478 10*3/uL — ABNORMAL HIGH (ref 150–450)
RBC: 4.88 x10E6/uL (ref 4.14–5.80)
RDW: 14.6 % (ref 11.6–15.4)
WBC: 9 10*3/uL (ref 3.4–10.8)

## 2022-05-29 LAB — COMPREHENSIVE METABOLIC PANEL
ALT: 28 IU/L (ref 0–44)
AST: 15 IU/L (ref 0–40)
Albumin/Globulin Ratio: 1.5 (ref 1.2–2.2)
Albumin: 4.1 g/dL (ref 3.8–4.8)
Alkaline Phosphatase: 97 IU/L (ref 44–121)
BUN/Creatinine Ratio: 23 (ref 10–24)
BUN: 16 mg/dL (ref 8–27)
Bilirubin Total: 0.3 mg/dL (ref 0.0–1.2)
CO2: 21 mmol/L (ref 20–29)
Calcium: 9.7 mg/dL (ref 8.6–10.2)
Chloride: 103 mmol/L (ref 96–106)
Creatinine, Ser: 0.69 mg/dL — ABNORMAL LOW (ref 0.76–1.27)
Globulin, Total: 2.8 g/dL (ref 1.5–4.5)
Glucose: 170 mg/dL — ABNORMAL HIGH (ref 70–99)
Potassium: 5.2 mmol/L (ref 3.5–5.2)
Sodium: 139 mmol/L (ref 134–144)
Total Protein: 6.9 g/dL (ref 6.0–8.5)
eGFR: 95 mL/min/{1.73_m2} (ref >59)

## 2022-05-29 LAB — LIPID PANEL W/O CHOL/HDL RATIO
Cholesterol, Total: 143 mg/dL (ref 100–199)
HDL: 52 mg/dL (ref >39)
LDL Chol Calc (NIH): 73 mg/dL (ref 0–99)
Triglycerides: 99 mg/dL (ref 0–149)
VLDL Cholesterol Cal: 18 mg/dL (ref 5–40)

## 2022-05-29 LAB — TSH: TSH: 3.09 u[IU]/mL (ref 0.450–4.500)

## 2022-05-29 LAB — PSA: Prostate Specific Ag, Serum: 1.8 ng/mL (ref 0.0–4.0)

## 2022-05-30 ENCOUNTER — Telehealth: Payer: Self-pay | Admitting: Family Medicine

## 2022-05-30 NOTE — Telephone Encounter (Signed)
Copied from [place] [id number]. Topic: Medicare AWV >> May 30, 2022  1:57 PM Devoria Glassing wrote: Reason for CRM: Called patient to schedule Medicare Annual Wellness Visit (AWV). Left message for patient to call back and schedule Medicare Annual Wellness Visit (AWV).  Last date of AWV: 04/03/21  Please schedule an appointment at any time with Kirke Shaggy, LPN after [id number] on AWV schedule. .  If any questions, please contact me.  Thank you ,  Sherol Dade; [place] Direct Dial: [phone]

## 2022-06-11 ENCOUNTER — Ambulatory Visit: Payer: Medicare Other | Admitting: Family Medicine

## 2022-06-18 ENCOUNTER — Ambulatory Visit: Payer: Medicare Other

## 2022-06-18 NOTE — Patient Outreach (Signed)
Care Management & Coordination Services Pharmacy Note  06/18/2022 Name:  Samuel Morris MRN:  [id number] DOB:  1945/06/29  Summary: Pleasant 77 year old male presents for f/u CCM visit. 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 His wife is from [place]. They also lived in [place] for 35 years. Unable to use [facility] They are raising their grandson Have chickens they take care of  Recommendations/Changes made from today's visit: -None, f/u November in case we need to do PAP that a specialist does   Subjective: Samuel Morris is an 77 y.o. year old male who is a primary patient of Dorcas Carrow, DO.  The care coordination team was consulted for assistance with disease management and care coordination needs.    Engaged with patient by telephone for follow up visit.  Recent office visits:  [name], PA-C. Seen for hospital follow up visit for a fall. Will initiate pneumonia abx with Augmentin 875-125 mg PO BID x 7 days as well as Zpack  Will change prednisone taper to Prednisone 40 mg PO QD x 7 day burst to assist with breathing. 02/09/22-Megan Holly Bodily, DO (PCP) Seen for a cough and congestive heart failure. Labs ordered. Flu vaccine given. Follow up in 4 months.  01/15/22-Megan Holly Bodily, DO (PCP) Seen for congestive heart failure and a cough. Follow up in 4 weeks. 01/11/22-Megan Holly Bodily, DO (PCP) Seen for a cough and leg swelling. Will increase him to  on his lasix for the next 4 days and follow up on Monday. Likely will need to go back on his  of lasix daily.  01/01/22-Jolene T. Harvest Dark, NP. Seen for upper respiratory infection. Ambulatory referral to Rheumatology. Chest x-ray ordered. Follow up in 1 week.  12/26/21-[name], PA-C. Seen for nasal congestion, sore throat and sinus drainage   Recent consult visits:  03/15/22-Manjula Magaraja (Pulmonology) Notes not available. 02/13/22-Kevin P. Allena Katz, DPM (Podiatry) Seen  for a nail problem. Follow up in 3 months   Hospital visits:  Medication Reconciliation was completed by comparing discharge summary, patient's EMR and Pharmacy list, and upon discussion with patient.   Admitted to the hospital on 03/02/22 due to a fall. Discharge date was 03/02/22. Discharged from [facility]     New?Medications Started at [facility] Discharge:?? -started none noted   Medication Changes at Hospital Discharge: -Changed none noted   Medications Discontinued at Hospital Discharge: -Stopped none noted   Medications that remain the same after Hospital Discharge:??  -All other medications will remain the same.     Medication Reconciliation was completed by comparing discharge summary, patient's EMR and Pharmacy list, and upon discussion with patient.   Admitted to the hospital on 02/24/22 due to SOB. Discharge date was 02/25/22. Discharged from [facility]     New?Medications Started at [facility] Discharge:?? -started none noted   Medication Changes at Hospital Discharge: -Changed none noted   Medications Discontinued at Hospital Discharge: -Stopped none noted   Medications that remain the same after Hospital Discharge:??  -All other medications will remain the same   Objective:  Lab Results  Component Value Date   CREATININE 0.69 (L) 05/28/2022   BUN 16 05/28/2022   EGFR 95 05/28/2022   GFRNONAA >60 01/12/2021   GFRAA 96 04/11/2020   NA 139 05/28/2022   K 5.2 05/28/2022   CALCIUM 9.7 05/28/2022   CO2 21 05/28/2022   GLUCOSE 170 (H) 05/28/2022    Lab Results  Component Value Date/Time   HGBA1C 7.2 (H) 05/28/2022 09:16 AM   HGBA1C 6.4 (H) 11/27/2021 01:50 PM   HGBA1C 6.5 12/20/2017 12:00 AM   HGBA1C 6.5 12/14/2016 12:00 AM   MICROALBUR 10 05/28/2022 09:16 AM   MICROALBUR 80 (H) 11/27/2021 01:50 PM    Last diabetic Eye exam:  Lab Results  Component Value Date/Time   HMDIABEYEEXA No Retinopathy 04/27/2022 12:00  AM    Last diabetic Foot exam: No results found for: "HMDIABFOOTEX"   Lab Results  Component Value Date   CHOL 143 05/28/2022   HDL 52 05/28/2022   LDLCALC 73 05/28/2022   TRIG 99 05/28/2022       Latest Ref Rng & Units 05/28/2022    9:17 AM 11/27/2021    1:52 PM 04/13/2021    8:29 AM  Hepatic Function  Total Protein 6.0 - 8.5 g/dL 6.9  6.5  7.1   Albumin 3.8 - 4.8 g/dL 4.1  3.8  4.5   AST 0 - 40 IU/L 15  18  18    ALT 0 - 44 IU/L 28  23  27    Alk Phosphatase 44 - 121 IU/L 97  117  103   Total Bilirubin 0.0 - 1.2 mg/dL 0.3  0.3  0.5     Lab Results  Component Value Date/Time   TSH 3.090 05/28/2022 09:17 AM   TSH 4.590 (H) 01/02/2022 10:34 AM       Latest Ref Rng & Units 05/28/2022    9:17 AM 11/27/2021    1:52 PM 04/13/2021    8:29 AM  CBC  WBC 3.4 - 10.8 x10E3/uL 9.0  8.5  8.6   Hemoglobin 13.0 - 17.7 g/dL 16.1  09.6  04.5   Hematocrit 37.5 - 51.0 % 42.9  42.6  45.0   Platelets 150 - 450 x10E3/uL 478  304  283     No results found for: "VD25OH", "VITAMINB12"  Clinical ASCVD: Yes  The ASCVD Risk score (Arnett DK, et al., 2019) failed to calculate for the following reasons:   The patient has a prior MI or stroke diagnosis    Other: (CHADS2VASc if Afib, MMRC or CAT for COPD, ACT, DEXA)     05/28/2022    8:59 AM 02/09/2022   10:52 AM 12/26/2021    9:35 AM  Depression screen PHQ 2/9  Decreased Interest 0 0 0  Down, Depressed, Hopeless 0 0 0  PHQ - 2 Score 0 0 0  Altered sleeping 0 0 0  Tired, decreased energy 0 0 0  Change in appetite 0 0 0  Feeling bad or failure about yourself  0 0 0  Trouble concentrating 0 0 0  Moving slowly or fidgety/restless 0 0 0  Suicidal thoughts 0 0 0  PHQ-9 Score 0 0 0  Difficult doing work/chores Not difficult at all Not difficult at all Not difficult at all     Social History   Tobacco Use  Smoking Status Never  Smokeless Tobacco Never   BP Readings from Last 3 Encounters:  05/28/22 109/70  03/09/22 100/69  02/09/22  110/73   Pulse Readings from Last 3 Encounters:  05/28/22 86  03/09/22 85  02/09/22 86   Wt Readings from Last 3 Encounters:  05/28/22 172 lb (78 kg)  03/09/22 170 lb 14.4 oz (77.5 kg)  02/09/22 176 lb 4.8 oz (80 kg)   BMI Readings from Last 3 Encounters:  05/28/22 29.52 kg/m  03/09/22 29.32 kg/m  02/09/22 30.26 kg/m  No Known Allergies  Medications Reviewed Today     Reviewed by Dorcas Carrow, DO (Physician) on 05/28/22 at [phone]  Med List Status: <None>   Medication Order Taking? Sig Documenting Provider Last Dose Status Informant  albuterol (PROVENTIL) (2.5 MG/3ML) 0.083% nebulizer solution [id number] Yes Take 3 mLs (2.5 mg total) by nebulization every 6 (six) hours as needed for wheezing or shortness of breath. Mecum, Erin E, PA-C Taking Active   albuterol (VENTOLIN HFA) 108 (90 Base) MCG/ACT inhaler [id number] Yes Inhale 1 puff into the lungs every 4 (four) hours as needed for wheezing or shortness of breath. Aura Dials T, NP Taking Active   Ascorbic Acid (VITAMIN C PO) [id number] Yes Take 100 mg by mouth daily. [provider] Taking Active Self  aspirin EC 81 MG tablet [id number] Yes Take 81 mg by mouth daily. [provider] Taking Active Self  Calcium-Magnesium 100-50 MG TABS [id number] Yes Take 1 tablet by mouth daily. [provider] Taking Active Self  Cholecalciferol (VITAMIN D-1000 MAX ST) 25 MCG (1000 UT) tablet [id number] Yes Take 1,000 Units by mouth daily. [provider] Taking Active Self  dapagliflozin propanediol (FARXIGA) 5 MG TABS tablet [id number] Yes Take 1 tablet (5 mg total) by mouth daily. Olevia Perches P, DO Taking Active   ENTRESTO 49-51 MG [id number] Yes Take 1 tablet by mouth 2 (two) times daily. [provider] Taking Active Self           Med Note Feliz Beam, Arie Sabina   Fri Dec 26, 2018  2:29 PM)    ezetimibe (ZETIA) 10 MG tablet [id number] Yes Take 1 tablet (10 mg total) by mouth daily. Johnson,  Megan P, DO Taking Active   fluticasone (FLONASE) 50 MCG/ACT nasal spray [id number] Yes Place 1 spray into both nostrils daily. [provider] Taking Active   folic acid (FOLVITE) 1 MG tablet [id number] Yes 1 tablet [provider] Taking Active   furosemide (LASIX) 40 MG tablet [id number] Yes Take 1 tablet (40 mg total) by mouth daily. Olevia Perches P, DO Taking Active   hydroxychloroquine (PLAQUENIL) 200 MG tablet [id number] Yes Take 2 tablets (400 mg total) by mouth daily. Dx M06.9 Aura Dials T, NP Taking Active   isosorbide mononitrate (IMDUR) 30 MG 24 hr tablet [id number] Yes TAKE 1/2 OF A TABLET (15 MG TOTAL) BY MOUTH DAILY Johnson, Megan P, DO Taking Active   methotrexate (RHEUMATREX) 2.5 MG tablet [id number] Yes Take 6 tablets (15 MG) by mouth weekly for Rheumatoid Arthritis. Dx M06.9 Marjie Skiff, NP Taking Active   metoprolol succinate (TOPROL-XL) 100 MG 24 hr tablet [id number] Yes TAKE 1 AND 1/2 TABLETS BY MOUTH DAILY WITH OR IMMEDIATELY FOLLOWING A MEAL Johnson, Megan P, DO Taking Active   mexiletine (MEXITIL) 150 MG capsule [id number] Yes Take 150 mg by mouth 2 (two) times daily. [provider] Taking Active   nitroGLYCERIN (NITROSTAT) 0.4 MG SL tablet [id number] Yes Place 1 tablet (0.4 mg total) under the tongue every 5 (five) minutes as needed for chest pain. Antonieta Iba, MD Taking Active   rivaroxaban (XARELTO) 20 MG TABS tablet [id number] Yes Take by mouth. [provider] Taking Active   rosuvastatin (CRESTOR) 20 MG tablet [id number] Yes Take 1 tablet (20 mg total) by mouth at bedtime. Olevia Perches P, DO Taking Active   spironolactone (ALDACTONE) 25 MG tablet [id number] Yes Take 1 tablet (25 mg total) by mouth daily. Johnson, Megan P, DO Taking Active   Turmeric (QC TUMERIC  COMPLEX PO) [id number] Yes Take by mouth daily. Unsure of dose [provider] Taking Active Self  vitamin B-12 (CYANOCOBALAMIN) 1000 MCG tablet [id number] Yes  Take by mouth daily. [provider] Taking Active Self            SDOH:  (Social Determinants of Health) assessments and interventions performed: Yes SDOH Interventions    Flowsheet Row Care Coordination from 06/18/2022 in [facility] Telephone from 03/07/2022 in [facility] Care Coordination Chronic Care Management from [facility]/18[facility]20[facility] Chronic Care Management from 09/18/2021 [facility] Clinical Support [facility] Chronic Care Management from 05/19/2019 in [facility]  SDOH Interventions        Food Insecurity Interventions -- -- -- -- Intervention Not Indicated --  Housing Interventions -- Intervention Not Indicated -- -- Intervention Not Indicated --  Transportation Interventions Intervention Not Indicated Intervention Not Indicated Intervention Not Indicated -- Intervention Not Indicated --  Utilities Interventions -- Intervention Not Indicated -- -- -- --  Financial Strain Interventions Other (Comment)  [PAP's] -- Other (Comment)  [PAP] Other (Comment)  [PAP] Intervention Not Indicated Other (Comment)  [patient assistance application]  Physical Activity Interventions -- -- -- -- Intervention Not Indicated --  Stress Interventions -- -- -- -- Intervention Not Indicated --  Social Connections Interventions -- -- -- -- Intervention Not Indicated --       Medication Assistance: (See note)   Name and location of Current pharmacy:  [facility] [place] - [address] [place] [address] Phone: [phone] Fax: [phone]  [id number] [place] [address] [place] [address] Phone: [phone] Fax: [phone]  Compliance/Adherence/Medication fill history: Adherence Review: Is the patient currently on ACE/ARB medication? No Does the patient have >5  day gap between last estimated fill dates? Yes   Star Rating Drugs:  Rosuvastatin 20 mg Last filled:11/27/21 90 DS, 10/29/21 90 DS    Assessment/Plan   Heart Failure (Goal: manage symptoms and prevent exacerbations) BNP (last 3 results) Recent Labs    01/11/22 1111  BNP 293.7*    ProBNP (last 3 results) No results for input(s): "PROBNP" in the last 8760 hours. BP Readings from Last 3 Encounters:  05/28/22 109/70  03/09/22 100/69  02/09/22 110/73    Pulse Readings from Last 3 Encounters:  05/28/22 86  03/09/22 85  02/09/22 86   -Managed by Dr. Ponciano OrtMichael Wiegert -Controlled -Last ejection fraction:  ECHO 2017 mild Aortic stenosis, AVA 1.4 cm2, EF35-40%, LVIDD 5.6 cm  ECHO May 2018 ejection fraction reduced 30-35 percent, moderate to severe aortic  09/2016 DSE, Baseline LV EF 30%.  Echocardiogram 01/11/2021,Left ventricular ejection fraction, by estimation, is 30 to 35%.  -HF type: HFrEF (EF < 40%) -NYHA Class: II (slight limitation of activity) -AHA HF Stage: C (Heart disease and symptoms present) -Current treatment: Imdur 30mg  Appropriate, Effective, Safe, Accessible Metoprolol succinate  100mg - Appropriate, Effective, Safe, Accessible Spironolactone  - Appropriate, Effective, Safe, Accessible Furosemide  40mg  - Appropriate, Effective, Safe, Accessible Entresto 49-51- Appropriate, Effective, Safe, Accessible 2024: PAP Approved by Cardio Farxiga 10mg  Appropriate, Effective, Safe, Accessible -Medications previously tried: N/A  -Current home BP/HR readings: not testing -Current home daily weights: not testing -Current dietary habits: "Tries to eat healthy" -Current exercise habits: None -Educated on Benefits of medications for managing symptoms and prolonging life -  Recommended to continue current medication   Atrial Fibrillation (Goal: prevent stroke and major bleeding) BP Readings from Last 3 Encounters:  05/28/22 109/70  03/09/22 100/69  02/09/22 110/73     Pulse Readings from Last 3 Encounters:  05/28/22 86  03/09/22 85  02/09/22 86   -MI around 2000 - cath led to medical Rx - not cath data at time of this initial visit  -December 17, 2016 coronary artery bypass grafting with a mammary to the LAD, SVG to the right coronary artery. Aortic valve replacement with a #25 mm bioprosthetic Edwards magna ease valve, Dr Katheren Shams  -CARDIAC CATHETERIZATION - Result Date: 01/10/2021  -Controlled -CHADSVASC: 6 -Current treatment: Rate control:  Metoprolol Succ 100mg  Appropriate, Effective, Safe, Accessible Anticoagulation:  Rivaroxaban 20mg  Appropriate, Effective, Safe, Accessible 2024: Gets 90 days for $15 from Cardio -Medications previously tried: N/A -Home BP and HR readings: not testing  -Counseled on increased risk of stroke due to Afib and benefits of anticoagulation for stroke prevention; -Recommended to continue current medication   CAD: (LDL goal < 70) The ASCVD Risk score (Arnett DK, et al., 2019) failed to calculate for the following reasons:   The patient has a prior MI or stroke diagnosis Lab Results  Component Value Date   CHOL 143 05/28/2022   CHOL 163 11/27/2021   CHOL 134 04/13/2021   Lab Results  Component Value Date   HDL 52 05/28/2022   HDL 41 11/27/2021   HDL 43 04/13/2021   Lab Results  Component Value Date   LDLCALC 73 05/28/2022   LDLCALC 98 11/27/2021   LDLCALC 74 04/13/2021   Lab Results  Component Value Date   TRIG 99 05/28/2022   TRIG 135 11/27/2021   TRIG 87 04/13/2021   No results found for: "CHOLHDL" No results found for: "LDLDIRECT" Last vitamin D No results found for: "25OHVITD2", "25OHVITD3", "VD25OH" Lab Results  Component Value Date   TSH 3.090 05/28/2022   -Controlled -Current treatment: Crestor 20 mg once daily  - Appropriate, Effective, Safe, Accessible Zetia 10 mg once daily  - Appropriate, Effective, Safe, Accessible ASA 81mg  Appropriate, Effective, Safe, Accessible March 2024  visit with PCP has patient continuing ASA -Side effect review - tolerating well, no problems -Educated on Cholesterol goals;  Benefits of statin for ASCVD risk reduction; -Recommended to continue current medication   Diabetes (A1c goal <7%) Lab Results  Component Value Date   HGBA1C 7.2 (H) 05/28/2022   HGBA1C 6.4 (H) 11/27/2021   HGBA1C 6.4 (H) 04/13/2021   Lab Results  Component Value Date   MICROALBUR 10 05/28/2022   LDLCALC 73 05/28/2022   CREATININE 0.69 (L) 05/28/2022    Lab Results  Component Value Date   NA 139 05/28/2022   K 5.2 05/28/2022   CREATININE 0.69 (L) 05/28/2022   EGFR 95 05/28/2022   GFRNONAA >60 01/12/2021   GLUCOSE 170 (H) 05/28/2022    Lab Results  Component Value Date   WBC 9.0 05/28/2022   HGB 13.9 05/28/2022   HCT 42.9 05/28/2022   MCV 88 05/28/2022   PLT 478 (H) 05/28/2022    Lab Results  Component Value Date   LABMICR Comment 05/28/2022   MICROALBUR 10 05/28/2022   MICROALBUR 80 (H) 11/27/2021   -Controlled -Current medications: Farxiga 5 mg Appropriate, Effective, Safe, Accessible 2024: PAP Approved 2023: PAP approved -Medications previously tried: N/A -Current home glucose readings fasting glucose: 100-120s post prandial glucose: n/a -Denies hypoglycemic/hyperglycemic symptoms -Recommended to continue current medication   COPD (  Goal: control symptoms and prevent exacerbations) -Uncontrolled -Current treatment  Albuterol Appropriate, Effective, Safe, Accessible States only uses when he's sick (Every couple months) -Medications previously tried: Engineer, materials -MMRC/CAT score:      No data to display        -Pulmonary function testing:  Pulmonary Functions Testing Results:  No results found for: "FEV1", "FVC", "FEV1FVC", "TLC", "DLCO"   -Exacerbations requiring treatment in last 6 months:   -December 2023 -Patient denies consistent use of maintenance inhaler -Frequency of rescue inhaler use: doesn't use -Counseled on  Proper inhaler technique; July 2023: Can't afford Virgel Bouquet, start PAP October 2023: Patient states he submitted PAP but hasn't heard back. He was at the Cardio office during phone call. Will have CCM team reach out and determine PAP status April 2024: Patient no longer using [name]. Doesn't use Albuterol that often, only when he's sick. Will defer to Pulm. Would recommend PFT's but I'm sure their data just isn't transmitting to [place]   CP F/U November 2024  Artelia Laroche, Ilda Basset.D. - [phone]

## 2022-07-06 ENCOUNTER — Telehealth: Payer: Self-pay

## 2022-07-06 NOTE — Progress Notes (Signed)
Care Management & Coordination Services Pharmacy Team  Reason for Encounter: Patient assistance    Patient is currently enrolled in [employer] patient assistance program for the medication [name]. Patient stated that he had return his application to [facility] when at the temporary location. Application was not received. I will email the Prefilled application to [employer], pt will call me when he is ready to go to the office to sign the application. He will be bringing his 2023 tax return for copy to be made to fax with application.  Samuel Morris

## 2022-07-10 NOTE — Progress Notes (Signed)
Patient called today to make me aware that he will be around [facility] today about 2:30 and would like to come and sign his application with [employer]. Also he has tax return he needs to make a copy for to be faxed with the application. Application was sent to [facility] email, and needs to be printed out for the patient to sign.  Velvet Bathe

## 2022-07-16 ENCOUNTER — Telehealth: Payer: Self-pay

## 2022-07-16 NOTE — Telephone Encounter (Signed)
Patient Assistance Program application is needing a copy of prescription of the [name] inhaler to attach with the application, but do not see a active prescription for [name] in patient's chart. Please advise?

## 2022-07-16 NOTE — Telephone Encounter (Signed)
He gets that from his pulmonologist- he will need to get the Rx from them.

## 2022-07-17 ENCOUNTER — Other Ambulatory Visit: Payer: Self-pay | Admitting: Family Medicine

## 2022-07-17 MED ORDER — FLUTICASONE FUROATE-VILANTEROL 100-25 MCG/ACT IN AEPB: 1.0000 | INHALATION_SPRAY | Freq: Every day | RESPIRATORY_TRACT | 3 refills | Status: AC

## 2022-07-17 NOTE — Telephone Encounter (Signed)
It does look like I wrote it for the PAP. I've printed another Rx to go out with it.

## 2022-07-17 NOTE — Telephone Encounter (Signed)
Spoke with Shontae from the Patient Assistance Program and she says the paperwork was faxed to our office to Dr Laural Benes, as she completed the paperwork for the patient last year and it was time for renewal. But, she says she understands if the provider is no longer wanting to complete the forms. Please advise? Old application and new were placed in provider's folder for review.

## 2022-07-19 ENCOUNTER — Telehealth: Payer: Self-pay

## 2022-07-19 NOTE — Progress Notes (Signed)
Care Management & Coordination Services Pharmacy Team  Reason for Encounter: Patient assistance renewal   Patient is currently enrolled in [employer] patient assistance program for the medication [name]. Spoke with representative Marchelle Folks who stated that patient documents were received on 07/18/22, stated looks like all documents are there but patient did not check in section one if he has Part D. I stated that he does not but she said that we have to check that yes or no box and fax again.    Samuel Morris

## 2022-07-20 ENCOUNTER — Telehealth: Payer: Self-pay | Admitting: Family Medicine

## 2022-07-20 NOTE — Telephone Encounter (Signed)
Contacted [name] to schedule their annual wellness visit. Appointment made for 07/30/2022.  Samuel Morris; Care Guide Ambulatory Clinical Support [facility] Direct Dial: [phone]

## 2022-07-20 NOTE — Progress Notes (Signed)
Care Management & Coordination Services Pharmacy Team  Reason for Encounter: Patient assistance renewal   Patient is currently enrolled in [employer] patient assistance program for the medication [name], start date 07/20/22 until date: 07/19/23. Shipment still processing but shipment can take up to 6-10 business days for delivery.  Spoke with patient to let him know that he was approved and that delivery will be about 2wks  Velvet Bathe

## 2022-08-16 ENCOUNTER — Ambulatory Visit: Payer: Medicare Other | Admitting: Podiatry

## 2022-08-21 ENCOUNTER — Telehealth: Payer: Self-pay

## 2022-08-21 NOTE — Progress Notes (Incomplete)
Care Management & Coordination Services Pharmacy Team  Reason for Encounter: General adherence update   Contacted patient for general health update and medication adherence call.  {[name]}   Recent office visits:  None noted  Recent consult visits:  07/24/22-Samuel Morris (Orthopedic surgeon) Notes not available.  07/18/22-Samuel Morris (Cardiology) Notes not available.  07/06/22-Samuel Morris (Pulmonology) Notes not available.   Hospital visits:  None in previous 6 months  Medications: Outpatient Encounter Medications as of 08/21/2022  Medication Sig   albuterol (PROVENTIL) (2.5 MG/3ML) 0.083% nebulizer solution Take 3 mLs (2.5 mg total) by nebulization every 6 (six) hours as needed for wheezing or shortness of breath.   albuterol (VENTOLIN HFA) 108 (90 Base) MCG/ACT inhaler Inhale 1 puff into the lungs every 4 (four) hours as needed for wheezing or shortness of breath.   Ascorbic Acid (VITAMIN C PO) Take 100 mg by mouth daily.   aspirin EC 81 MG tablet Take 81 mg by mouth daily.   Calcium-Magnesium 100-50 MG TABS Take 1 tablet by mouth daily.   Cholecalciferol (VITAMIN D-1000 MAX ST) 25 MCG (1000 UT) tablet Take 1,000 Units by mouth daily.   dapagliflozin propanediol (FARXIGA) 5 MG TABS tablet Take 1 tablet (5 mg total) by mouth daily.   ENTRESTO 49-51 MG Take 1 tablet by mouth 2 (two) times daily.   ezetimibe (ZETIA) 10 MG tablet Take 1 tablet (10 mg total) by mouth daily.   fluticasone (FLONASE) 50 MCG/ACT nasal spray Place 1 spray into both nostrils daily.   fluticasone furoate-vilanterol ([name] ELLIPTA) 100-25 MCG/ACT AEPB Inhale 1 puff into the lungs daily.   folic acid (FOLVITE) 1 MG tablet 1 tablet   furosemide (LASIX) 40 MG tablet Take 1 tablet (40 mg total) by mouth daily.   hydroxychloroquine (PLAQUENIL) 200 MG tablet Take 2 tablets (400 mg total) by mouth daily. Dx M06.9   isosorbide mononitrate (IMDUR) 30 MG 24 hr tablet TAKE 1/2 OF A  TABLET (15 MG TOTAL) BY MOUTH DAILY   methotrexate (RHEUMATREX) 2.5 MG tablet Take 6 tablets (15 MG) by mouth weekly for Rheumatoid Arthritis. Dx M06.9   metoprolol succinate (TOPROL-XL) 100 MG 24 hr tablet TAKE 1 AND 1/2 TABLETS BY MOUTH DAILY WITH OR IMMEDIATELY FOLLOWING A MEAL   mexiletine (MEXITIL) 150 MG capsule Take 150 mg by mouth 2 (two) times daily.   nitroGLYCERIN (NITROSTAT) 0.4 MG SL tablet Place 1 tablet (0.4 mg total) under the tongue every 5 (five) minutes as needed for chest pain.   rivaroxaban (XARELTO) 20 MG TABS tablet Take by mouth.   rosuvastatin (CRESTOR) 20 MG tablet Take 1 tablet (20 mg total) by mouth at bedtime.   spironolactone (ALDACTONE) 25 MG tablet Take 1 tablet (25 mg total) by mouth daily.   Turmeric (QC TUMERIC COMPLEX PO) Take by mouth daily. Unsure of dose   vitamin B-12 (CYANOCOBALAMIN) 1000 MCG tablet Take by mouth daily.   No facility-administered encounter medications on file as of 08/21/2022.    Recent vitals BP Readings from Last 3 Encounters:  05/28/22 109/70  03/09/22 100/69  02/09/22 110/73   Pulse Readings from Last 3 Encounters:  05/28/22 86  03/09/22 85  02/09/22 86   Wt Readings from Last 3 Encounters:  05/28/22 172 lb (78 kg)  03/09/22 170 lb 14.4 oz (77.5 kg)  02/09/22 176 lb 4.8 oz (80 kg)   BMI Readings from Last 3 Encounters:  05/28/22 29.52 kg/m  03/09/22 29.32 kg/m  02/09/22 30.26 kg/m  Recent lab results    Component Value Date/Time   NA 139 05/28/2022 0917   K 5.2 05/28/2022 0917   CL 103 05/28/2022 0917   CO2 21 05/28/2022 0917   GLUCOSE 170 (H) 05/28/2022 0917   GLUCOSE 140 (H) 01/12/2021 0537   BUN 16 05/28/2022 0917   CREATININE 0.69 (L) 05/28/2022 0917   CALCIUM 9.7 05/28/2022 0917    Lab Results  Component Value Date   CREATININE 0.69 (L) 05/28/2022   EGFR 95 05/28/2022   GFRNONAA >60 01/12/2021   GFRAA 96 04/11/2020   Lab Results  Component Value Date/Time   HGBA1C 7.2 (H) 05/28/2022 09:16  AM   HGBA1C 6.4 (H) 11/27/2021 01:50 PM   HGBA1C 6.5 12/20/2017 12:00 AM   HGBA1C 6.5 12/14/2016 12:00 AM   MICROALBUR 10 05/28/2022 09:16 AM   MICROALBUR 80 (H) 11/27/2021 01:50 PM    Lab Results  Component Value Date   CHOL 143 05/28/2022   HDL 52 05/28/2022   LDLCALC 73 05/28/2022   TRIG 99 05/28/2022   What concerns do you have about your medications?  The patient {denies/reports:25180} side effects with their medications.   How often do you forget or accidentally miss a dose? {missed doses:25554}  Do you use a pillbox? {yes/no:20286}  Are you having any problems getting your medications from your pharmacy? {yes/no:20286}  Has the cost of your medications been a concern? {yes/no:20286} If yes, what medication and is patient assistance available or has it been applied for?  Since last visit with PharmD, {no/thefollowing:25210} interventions have been made.   The patient {has/has not:25209} had an ED visit since last contact.   The patient {denies/reports:25180} problems with their health.   Patient {denies/reports:25180} concerns or questions for ***, PharmD at this time.   Counseled patient on: {GENERALCOUNSELING:28686}  Care Gaps: Annual wellness visit in last year? Yes  If Diabetic: Last eye exam / retinopathy screening:04/27/22 Last diabetic foot exam:05/28/22 Last UACR: 05/28/22   Star Rating Drugs:  Rosuvastatin 20 mg Last filled:11/27/21 90 DS, 05/04/22 90 DS Entresto 49-51 mg Last filled:08/24/20 NA, 08/30/21 90 DS Farxiga 5 mg Last filled:04/25/20 90 DS    Samuel Morris, RMA

## 2022-09-14 ENCOUNTER — Ambulatory Visit: Payer: Self-pay

## 2022-09-14 NOTE — Telephone Encounter (Signed)
      Chief Complaint: 3 days ago pulled a tick off shoulder. Has several "bite places. One on my leg is red and infected looking." Has headache, fatigue and body aches. Symptoms: Above Frequency: Pulled tick off 3 days ago Pertinent Negatives: Patient denies fever Disposition: [] ED /[x] Urgent Care (no appt availability in office) / [] Appointment(In office/virtual)/ []  [facility]/ [] Home Care/ [] Refused Recommended Disposition /[] [facility]/ []  Follow-up with PCP Additional Notes: Agrees with UC, no availability in practice.  Reason for Disposition  Red ring or bull's-eye rash occurs at tick bite  Answer Assessment - Initial Assessment Questions 1. ATTACHED:  "Is the tick still on the skin?"  (e.g., yes, no, unsure)     No 2. ONSET - TICK STILL ATTACHED:  "How long do you think the tick has been on your skin?" (e.g., hours, days, unsure)  Note:  Is there a recent activity (camping, hiking) where the caller may have been exposed?     No 3. ONSET - TICK NOT STILL ATTACHED: "If the tick has been removed, how long do you think the tick was attached before you removed it?" (e.g., 5 hours, 2 days). "When was this?"     3 days ago 4. LOCATION: "Where is the tick bite located?" (e.g., arm, leg)     Shoulder, left leg at knee 5. TYPE of TICK: "Is it a wood tick or a deer tick?" (e.g., deer tick, wood tick; unsure)     Unsure 6. SIZE of TICK: "How big is the tick?" (e.g., size of poppy seed, apple seed, watermelon seed; unsure) Note: Deer ticks can be the size of a poppy seed (nymph) or an apple seed (adult).       Small brown tick 7. ENGORGED: "Did the tick look flat or engorged (full, swollen)?" (e.g., flat, engorged; unsure)     Flat 8. OTHER SYMPTOMS: "Do you have any other symptoms?" (e.g., fever, rash, redness at bite area, red ring around bite)     Headache, fatigue, achy 9. PREGNANCY: "Is there any chance you are pregnant?" "When was your last menstrual  period?"     N/a  Protocols used: Tick Bite-A-AH

## 2022-09-17 NOTE — Telephone Encounter (Signed)
Noted  

## 2022-09-26 ENCOUNTER — Telehealth: Payer: Self-pay | Admitting: Family Medicine

## 2022-09-26 NOTE — Telephone Encounter (Signed)
[facility] is calling in requesting notes as well as labs or xrays (if applicable) from pt's last day of service be faxed over to fax: [phone]

## 2022-09-27 NOTE — Telephone Encounter (Signed)
Last OV note and labs printed and faxed as requested.

## 2022-11-11 HISTORY — PX: REPLACEMENT TOTAL KNEE: SUR1224

## 2022-11-20 ENCOUNTER — Ambulatory Visit (INDEPENDENT_AMBULATORY_CARE_PROVIDER_SITE_OTHER): Payer: Medicare Other | Admitting: Podiatry

## 2022-11-20 DIAGNOSIS — M7672 Peroneal tendinitis, left leg: Secondary | ICD-10-CM

## 2022-11-20 NOTE — Progress Notes (Signed)
Subjective:  Patient ID: Samuel Morris, male    DOB: [birth year],  MRN: [id number]  Chief Complaint  Patient presents with   Nail Problem    Nail trim    77 y.o. male presents with the above complaint.  Patient presents with complaint left lateral peroneal tendinitis insertional pain.  Patient states painful to touch is progressive gotten worse worse with ambulation worse with pressure he has not seen anyone else prior to seeing me for this.  Denies any other acute complaints.  He would like to discuss treatment options.  Pain scale 7 out of 10 dull aching nature   Review of Systems: Negative except as noted in the HPI. Denies N/V/F/Ch.  Past Medical History:  Diagnosis Date   Arrhythmia    paroxysmal atrial fibrillation   Cancer (HCC)    Scalp   Cataract    CHF (congestive heart failure) (HCC)    COVID-19 11/16/2020   Diabetes mellitus without complication (HCC)    Heart attack (HCC)    Heart disease    Hyperlipidemia    Hypertension    Sleep apnea    Stenosis of aorta    Ventricular tachycardia (HCC)     Current Outpatient Medications:    albuterol (PROVENTIL) (2.5 MG/3ML) 0.083% nebulizer solution, Take 3 mLs (2.5 mg total) by nebulization every 6 (six) hours as needed for wheezing or shortness of breath., Disp: 150 mL, Rfl: 1   albuterol (VENTOLIN HFA) 108 (90 Base) MCG/ACT inhaler, Inhale 1 puff into the lungs every 4 (four) hours as needed for wheezing or shortness of breath., Disp: 18 g, Rfl: 3   Ascorbic Acid (VITAMIN C PO), Take 100 mg by mouth daily., Disp: , Rfl:    aspirin EC 81 MG tablet, Take 81 mg by mouth daily., Disp: , Rfl:    Calcium-Magnesium 100-50 MG TABS, Take 1 tablet by mouth daily., Disp: , Rfl:    Cholecalciferol (VITAMIN D-1000 MAX ST) 25 MCG (1000 UT) tablet, Take 1,000 Units by mouth daily., Disp: , Rfl:    dapagliflozin propanediol (FARXIGA) 5 MG TABS tablet, Take 1 tablet (5 mg total) by mouth daily., Disp: 90 tablet, Rfl: 1   ENTRESTO 49-51 MG,  Take 1 tablet by mouth 2 (two) times daily., Disp: , Rfl:    ezetimibe (ZETIA) 10 MG tablet, Take 1 tablet (10 mg total) by mouth daily., Disp: 90 tablet, Rfl: 1   fluticasone (FLONASE) 50 MCG/ACT nasal spray, Place 1 spray into both nostrils daily., Disp: , Rfl:    fluticasone furoate-vilanterol ([name] ELLIPTA) 100-25 MCG/ACT AEPB, Inhale 1 puff into the lungs daily., Disp: 60 each, Rfl: 3   folic acid (FOLVITE) 1 MG tablet, 1 tablet, Disp: , Rfl:    furosemide (LASIX) 40 MG tablet, Take 1 tablet (40 mg total) by mouth daily., Disp: 90 tablet, Rfl: 1   hydroxychloroquine (PLAQUENIL) 200 MG tablet, Take 2 tablets (400 mg total) by mouth daily. Dx M06.9, Disp: 180 tablet, Rfl: 1   isosorbide mononitrate (IMDUR) 30 MG 24 hr tablet, TAKE 1/2 OF A TABLET (15 MG TOTAL) BY MOUTH DAILY, Disp: 45 tablet, Rfl: 1   methotrexate (RHEUMATREX) 2.5 MG tablet, Take 6 tablets (15 MG) by mouth weekly for Rheumatoid Arthritis. Dx M06.9, Disp: 48 tablet, Rfl: 1   metoprolol succinate (TOPROL-XL) 100 MG 24 hr tablet, TAKE 1 AND 1/2 TABLETS BY MOUTH DAILY WITH OR IMMEDIATELY FOLLOWING A MEAL, Disp: 45 tablet, Rfl: 1   mexiletine (MEXITIL) 150 MG capsule, Take 150 mg  by mouth 2 (two) times daily., Disp: , Rfl:    nitroGLYCERIN (NITROSTAT) 0.4 MG SL tablet, Place 1 tablet (0.4 mg total) under the tongue every 5 (five) minutes as needed for chest pain., Disp: 25 tablet, Rfl: 3   rivaroxaban (XARELTO) 20 MG TABS tablet, Take by mouth., Disp: , Rfl:    rosuvastatin (CRESTOR) 20 MG tablet, Take 1 tablet (20 mg total) by mouth at bedtime., Disp: 90 tablet, Rfl: 1   spironolactone (ALDACTONE) 25 MG tablet, Take 1 tablet (25 mg total) by mouth daily., Disp: 90 tablet, Rfl: 1   Turmeric (QC TUMERIC COMPLEX PO), Take by mouth daily. Unsure of dose, Disp: , Rfl:    vitamin B-12 (CYANOCOBALAMIN) 1000 MCG tablet, Take by mouth daily., Disp: , Rfl:   Social History   Tobacco Use  Smoking Status Never  Smokeless Tobacco Never     No Known Allergies Objective:  There were no vitals filed for this visit. There is no height or weight on file to calculate BMI. Constitutional Well developed. Well nourished.  Vascular Dorsalis pedis pulses palpable bilaterally. Posterior tibial pulses palpable bilaterally. Capillary refill normal to all digits.  No cyanosis or clubbing noted. Pedal hair growth normal.  Neurologic Normal speech. Oriented to person, place, and time. Epicritic sensation to light touch grossly present bilaterally.  Dermatologic Nails well groomed and normal in appearance. No open wounds. No skin lesions.  Orthopedic: Pain on palpation to the left lateral foot.  Pain with resisted dorsiflexion of the foot.  No overt pain with plantarflexion inversion of the foot.  No pain at the posterior tibial tendon Achilles tendon plantar fasciitis   Radiographs: None Assessment:   1. Peroneal tendinitis, left    Plan:  Patient was evaluated and treated and all questions answered.  Left peroneal tendinitis -All questions and concerns were discussed with the patient extensive detail given the amount of pain that he is having he will benefit from a steroid injection to help decrease acute inflammatory component associate with pain.  Patient agrees with plan like to proceed with steroid injection I discussed the risk for rupture associated with it -A steroid injection was performed at left lateral foot at point of maximal tenderness using 1% plain Lidocaine and 10 mg of Kenalog. This was well tolerated. -Shoe modification discussed   No follow-ups on file.

## 2022-11-21 LAB — HEMOGLOBIN A1C: Hemoglobin A1C: 6.5

## 2022-11-28 ENCOUNTER — Ambulatory Visit: Payer: Medicare Other | Admitting: Family Medicine

## 2022-12-20 ENCOUNTER — Ambulatory Visit: Payer: Medicare Other | Admitting: Family Medicine

## 2023-01-11 ENCOUNTER — Encounter: Payer: Self-pay | Admitting: Family Medicine

## 2023-01-11 ENCOUNTER — Ambulatory Visit: Payer: Medicare Other | Admitting: Family Medicine

## 2023-01-11 VITALS — BP 129/77 | HR 53 | Ht 64.0 in | Wt 165.0 lb

## 2023-01-11 DIAGNOSIS — E785 Hyperlipidemia, unspecified: Secondary | ICD-10-CM

## 2023-01-11 DIAGNOSIS — I509 Heart failure, unspecified: Secondary | ICD-10-CM

## 2023-01-11 DIAGNOSIS — E1159 Type 2 diabetes mellitus with other circulatory complications: Secondary | ICD-10-CM | POA: Diagnosis not present

## 2023-01-11 DIAGNOSIS — I25118 Atherosclerotic heart disease of native coronary artery with other forms of angina pectoris: Secondary | ICD-10-CM

## 2023-01-11 DIAGNOSIS — E1169 Type 2 diabetes mellitus with other specified complication: Secondary | ICD-10-CM | POA: Diagnosis not present

## 2023-01-11 DIAGNOSIS — D692 Other nonthrombocytopenic purpura: Secondary | ICD-10-CM | POA: Diagnosis not present

## 2023-01-11 DIAGNOSIS — E038 Other specified hypothyroidism: Secondary | ICD-10-CM | POA: Diagnosis not present

## 2023-01-11 DIAGNOSIS — J849 Interstitial pulmonary disease, unspecified: Secondary | ICD-10-CM

## 2023-01-11 DIAGNOSIS — I10 Essential (primary) hypertension: Secondary | ICD-10-CM

## 2023-01-11 MED ORDER — EZETIMIBE 10 MG PO TABS: 10.0000 mg | ORAL_TABLET | Freq: Every day | ORAL | 1 refills | Status: AC

## 2023-01-11 MED ORDER — DAPAGLIFLOZIN PROPANEDIOL 5 MG PO TABS: 5.0000 mg | ORAL_TABLET | Freq: Every day | ORAL | 1 refills | Status: AC

## 2023-01-11 MED ORDER — ROSUVASTATIN CALCIUM 20 MG PO TABS: 20.0000 mg | ORAL_TABLET | Freq: Every day | ORAL | 1 refills | Status: AC

## 2023-01-11 MED ORDER — SPIRONOLACTONE 25 MG PO TABS: 25.0000 mg | ORAL_TABLET | Freq: Every day | ORAL | 1 refills | Status: AC

## 2023-01-11 MED ORDER — METOPROLOL SUCCINATE ER 100 MG PO TB24: ORAL_TABLET | ORAL | 1 refills | Status: AC

## 2023-01-11 MED ORDER — ISOSORBIDE MONONITRATE ER 30 MG PO TB24: ORAL_TABLET | ORAL | 1 refills | Status: AC

## 2023-01-11 MED ORDER — ALBUTEROL SULFATE HFA 108 (90 BASE) MCG/ACT IN AERS: 1.0000 | INHALATION_SPRAY | RESPIRATORY_TRACT | 3 refills | Status: AC | PRN

## 2023-01-11 MED ORDER — FUROSEMIDE 40 MG PO TABS: 40.0000 mg | ORAL_TABLET | Freq: Every day | ORAL | 1 refills | Status: AC

## 2023-01-11 MED ORDER — ALBUTEROL SULFATE (2.5 MG/3ML) 0.083% IN NEBU: 2.5000 mg | INHALATION_SOLUTION | Freq: Four times a day (QID) | RESPIRATORY_TRACT | 1 refills | Status: DC | PRN

## 2023-01-11 NOTE — Progress Notes (Signed)
BP 129/77   Pulse (!) 53   Ht 5\' 4"  (1.626 m)   Wt 165 lb (74.8 kg)   SpO2 98%   BMI 28.32 kg/m    Subjective:    Patient ID: Samuel Morris, male    DOB: 09/19/1945, 77 y.o.   MRN: [id number]  HPI: Samuel Morris is a 77 y.o. male  Chief Complaint  Patient presents with   Hypertension   DIABETES Hypoglycemic episodes:no Polydipsia/polyuria: no Visual disturbance: no Chest pain: no Paresthesias: no Glucose Monitoring: yes Taking Insulin?: no Blood Pressure Monitoring: not checking Retinal Examination: Up to Date Foot Exam: Up to Date Diabetic Education: Completed Pneumovax: Up to Date Influenza: Up to Date Aspirin: yes  HYPERTENSION / HYPERLIPIDEMIA Satisfied with current treatment? yes Duration of hypertension: chronic BP monitoring frequency: not checking BP medication side effects: no Past BP meds: spironalactone, metoprolol, imdur, lasix, entresto Duration of hyperlipidemia: chronic Cholesterol medication side effects: no Cholesterol supplements: none Past cholesterol medications: zetia, crestor Medication compliance: excellent compliance Aspirin: yes Recent stressors: no Recurrent headaches: no Visual changes: no Palpitations: no Dyspnea: no Chest pain: no Lower extremity edema: no Dizzy/lightheaded: no  SUBCLINICAL HYPOTHYROIDISM Thyroid control status:stable Satisfied with current treatment? yes Medication side effects: N/A Fatigue: no Cold intolerance: no Heat intolerance: no Weight gain: no Weight loss: no Constipation: no Diarrhea/loose stools: no Palpitations: no Lower extremity edema: no Anxiety/depressed mood: no  Relevant past medical, surgical, family and social history reviewed and updated as indicated. Interim medical history since our last visit reviewed. Allergies and medications reviewed and updated.  Review of Systems  Constitutional: Negative.   Respiratory: Negative.    Cardiovascular: Negative.   Gastrointestinal:  Negative.   Musculoskeletal: Negative.   Neurological: Negative.   Psychiatric/Behavioral: Negative.      Per HPI unless specifically indicated above     Objective:    BP 129/77   Pulse (!) 53   Ht 5\' 4"  (1.626 m)   Wt 165 lb (74.8 kg)   SpO2 98%   BMI 28.32 kg/m   Wt Readings from Last 3 Encounters:  01/11/23 165 lb (74.8 kg)  05/28/22 172 lb (78 kg)  03/09/22 170 lb 14.4 oz (77.5 kg)    Physical Exam Vitals and nursing note reviewed.  Constitutional:      General: He is not in acute distress.    Appearance: Normal appearance. He is not ill-appearing, toxic-appearing or diaphoretic.  HENT:     Head: Normocephalic and atraumatic.     Right Ear: External ear normal.     Left Ear: External ear normal.     Nose: Nose normal.     Mouth/Throat:     Mouth: Mucous membranes are moist.     Pharynx: Oropharynx is clear.  Eyes:     General: No scleral icterus.       Right eye: No discharge.        Left eye: No discharge.     Extraocular Movements: Extraocular movements intact.     Conjunctiva/sclera: Conjunctivae normal.     Pupils: Pupils are equal, round, and reactive to light.  Cardiovascular:     Rate and Rhythm: Normal rate and regular rhythm.     Pulses: Normal pulses.     Heart sounds: Normal heart sounds. No murmur heard.    No friction rub. No gallop.  Pulmonary:     Effort: Pulmonary effort is normal. No respiratory distress.     Breath sounds: Normal breath sounds. No stridor. No  wheezing, rhonchi or rales.  Chest:     Chest wall: No tenderness.  Musculoskeletal:        General: Normal range of motion.     Cervical back: Normal range of motion and neck supple.  Skin:    General: Skin is warm and dry.     Capillary Refill: Capillary refill takes less than 2 seconds.     Coloration: Skin is not jaundiced or pale.     Findings: No bruising, erythema, lesion or rash.  Neurological:     General: No focal deficit present.     Mental Status: He is alert and  oriented to person, place, and time. Mental status is at baseline.  Psychiatric:        Mood and Affect: Mood normal.        Behavior: Behavior normal.        Thought Content: Thought content normal.        Judgment: Judgment normal.     Results for orders placed or performed in visit on 01/11/23  CBC with Differential/Platelet  Result Value Ref Range   WBC 6.7 3.4 - 10.8 x10E3/uL   RBC 4.60 4.14 - 5.80 x10E6/uL   Hemoglobin 12.9 (L) 13.0 - 17.7 g/dL   Hematocrit 32.4 40.1 - 51.0 %   MCV 89 79 - 97 fL   MCH 28.0 26.6 - 33.0 pg   MCHC 31.7 31.5 - 35.7 g/dL   RDW 02.7 25.3 - 66.4 %   Platelets 291 150 - 450 x10E3/uL   Neutrophils 61 Not Estab. %   Lymphs 19 Not Estab. %   Monocytes 10 Not Estab. %   Eos 9 Not Estab. %   Basos 1 Not Estab. %   Neutrophils Absolute 4.2 1.4 - 7.0 x10E3/uL   Lymphocytes Absolute 1.3 0.7 - 3.1 x10E3/uL   Monocytes Absolute 0.6 0.1 - 0.9 x10E3/uL   EOS (ABSOLUTE) 0.6 (H) 0.0 - 0.4 x10E3/uL   Basophils Absolute 0.1 0.0 - 0.2 x10E3/uL   Immature Granulocytes 0 Not Estab. %   Immature Grans (Abs) 0.0 0.0 - 0.1 x10E3/uL  Comprehensive metabolic panel  Result Value Ref Range   Glucose 119 (H) 70 - 99 mg/dL   BUN 12 8 - 27 mg/dL   Creatinine, Ser 4.03 (L) 0.76 - 1.27 mg/dL   eGFR 96 >47 QQ/VZD/6.38   BUN/Creatinine Ratio 18 10 - 24   Sodium 136 134 - 144 mmol/L   Potassium 4.6 3.5 - 5.2 mmol/L   Chloride 100 96 - 106 mmol/L   CO2 20 20 - 29 mmol/L   Calcium 9.3 8.6 - 10.2 mg/dL   Total Protein 7.0 6.0 - 8.5 g/dL   Albumin 3.9 3.8 - 4.8 g/dL   Globulin, Total 3.1 1.5 - 4.5 g/dL   Bilirubin Total 0.4 0.0 - 1.2 mg/dL   Alkaline Phosphatase 150 (H) 44 - 121 IU/L   AST 15 0 - 40 IU/L   ALT 12 0 - 44 IU/L  TSH  Result Value Ref Range   TSH 5.280 (H) 0.450 - 4.500 uIU/mL  Lipid Panel w/o Chol/HDL Ratio  Result Value Ref Range   Cholesterol, Total 171 100 - 199 mg/dL   Triglycerides 756 0 - 149 mg/dL   HDL 40 >43 mg/dL   VLDL Cholesterol Cal 24  5 - 40 mg/dL   LDL Chol Calc (NIH) 329 (H) 0 - 99 mg/dL  Hemoglobin J1O  Result Value Ref Range   Hemoglobin A1C 6.5  Assessment & Plan:   Problem List Items Addressed This Visit       Cardiovascular and Mediastinum   CAD (coronary artery disease)    Continue to keep BP and cholesterol under good control. Continue to monitor. Continue to follow with cardiology. Call with any concerns.       Relevant Medications   ezetimibe (ZETIA) 10 MG tablet   furosemide (LASIX) 40 MG tablet   isosorbide mononitrate (IMDUR) 30 MG 24 hr tablet   metoprolol succinate (TOPROL-XL) 100 MG 24 hr tablet   rosuvastatin (CRESTOR) 20 MG tablet   spironolactone (ALDACTONE) 25 MG tablet   Other Relevant Orders   CBC with Differential/Platelet (Completed)   Comprehensive metabolic panel (Completed)   Type 2 diabetes mellitus with cardiac complication (HCC) - Primary    Checked at endocrinology in September. Under good control with A1c of 6.5. Continue current regimen. Call with any concerns.       Relevant Medications   dapagliflozin propanediol (FARXIGA) 5 MG TABS tablet   ezetimibe (ZETIA) 10 MG tablet   furosemide (LASIX) 40 MG tablet   isosorbide mononitrate (IMDUR) 30 MG 24 hr tablet   metoprolol succinate (TOPROL-XL) 100 MG 24 hr tablet   rosuvastatin (CRESTOR) 20 MG tablet   spironolactone (ALDACTONE) 25 MG tablet   Other Relevant Orders   CBC with Differential/Platelet (Completed)   Comprehensive metabolic panel (Completed)   HTN (hypertension)    Under good control on current regimen. Continue current regimen. Continue to monitor. Call with any concerns. Refills given. Labs drawn today.        Relevant Medications   ezetimibe (ZETIA) 10 MG tablet   furosemide (LASIX) 40 MG tablet   isosorbide mononitrate (IMDUR) 30 MG 24 hr tablet   metoprolol succinate (TOPROL-XL) 100 MG 24 hr tablet   rosuvastatin (CRESTOR) 20 MG tablet   spironolactone (ALDACTONE) 25 MG tablet   Other  Relevant Orders   CBC with Differential/Platelet (Completed)   Comprehensive metabolic panel (Completed)   Senile purpura (HCC)    Reassured patient. Continue to monitor.       Relevant Medications   ezetimibe (ZETIA) 10 MG tablet   furosemide (LASIX) 40 MG tablet   isosorbide mononitrate (IMDUR) 30 MG 24 hr tablet   metoprolol succinate (TOPROL-XL) 100 MG 24 hr tablet   rosuvastatin (CRESTOR) 20 MG tablet   spironolactone (ALDACTONE) 25 MG tablet   Other Relevant Orders   CBC with Differential/Platelet (Completed)   Comprehensive metabolic panel (Completed)   Chronic congestive heart failure (HCC)    Continue to keep BP and cholesterol under good control. Continue to monitor. Continue to follow with cardiology. Call with any concerns.       Relevant Medications   ezetimibe (ZETIA) 10 MG tablet   furosemide (LASIX) 40 MG tablet   isosorbide mononitrate (IMDUR) 30 MG 24 hr tablet   metoprolol succinate (TOPROL-XL) 100 MG 24 hr tablet   rosuvastatin (CRESTOR) 20 MG tablet   spironolactone (ALDACTONE) 25 MG tablet   Other Relevant Orders   CBC with Differential/Platelet (Completed)   Comprehensive metabolic panel (Completed)     Respiratory   Interstitial lung disease (HCC)    Stable. Continue to follow with pulmonology. Call with any concerns. Continue to monitor.       Relevant Medications   albuterol (PROVENTIL) (2.5 MG/3ML) 0.083% nebulizer solution     Endocrine   Hyperlipidemia associated with type 2 diabetes mellitus (HCC)    Under good control on current regimen.  Continue current regimen. Continue to monitor. Call with any concerns. Refills given. Labs drawn today.       Relevant Medications   dapagliflozin propanediol (FARXIGA) 5 MG TABS tablet   ezetimibe (ZETIA) 10 MG tablet   furosemide (LASIX) 40 MG tablet   isosorbide mononitrate (IMDUR) 30 MG 24 hr tablet   metoprolol succinate (TOPROL-XL) 100 MG 24 hr tablet   rosuvastatin (CRESTOR) 20 MG tablet    spironolactone (ALDACTONE) 25 MG tablet   Other Relevant Orders   CBC with Differential/Platelet (Completed)   Comprehensive metabolic panel (Completed)   Lipid Panel w/o Chol/HDL Ratio (Completed)   Subclinical hypothyroidism    Rechecking labs today. Await results. Treat as needed.       Relevant Medications   metoprolol succinate (TOPROL-XL) 100 MG 24 hr tablet   Other Relevant Orders   CBC with Differential/Platelet (Completed)   Comprehensive metabolic panel (Completed)   TSH (Completed)     Follow up plan: Return in about 6 months (around 07/11/2023) for physical.

## 2023-01-12 LAB — CBC WITH DIFFERENTIAL/PLATELET
Basophils Absolute: 0.1 10*3/uL (ref 0.0–0.2)
Basos: 1 %
EOS (ABSOLUTE): 0.6 10*3/uL — ABNORMAL HIGH (ref 0.0–0.4)
Eos: 9 %
Hematocrit: 40.7 % (ref 37.5–51.0)
Hemoglobin: 12.9 g/dL — ABNORMAL LOW (ref 13.0–17.7)
Immature Grans (Abs): 0 10*3/uL (ref 0.0–0.1)
Immature Granulocytes: 0 %
Lymphocytes Absolute: 1.3 10*3/uL (ref 0.7–3.1)
Lymphs: 19 %
MCH: 28 pg (ref 26.6–33.0)
MCHC: 31.7 g/dL (ref 31.5–35.7)
MCV: 89 fL (ref 79–97)
Monocytes Absolute: 0.6 10*3/uL (ref 0.1–0.9)
Monocytes: 10 %
Neutrophils Absolute: 4.2 10*3/uL (ref 1.4–7.0)
Neutrophils: 61 %
Platelets: 291 10*3/uL (ref 150–450)
RBC: 4.6 x10E6/uL (ref 4.14–5.80)
RDW: 14.3 % (ref 11.6–15.4)
WBC: 6.7 10*3/uL (ref 3.4–10.8)

## 2023-01-12 LAB — COMPREHENSIVE METABOLIC PANEL
ALT: 12 [IU]/L (ref 0–44)
AST: 15 [IU]/L (ref 0–40)
Albumin: 3.9 g/dL (ref 3.8–4.8)
Alkaline Phosphatase: 150 [IU]/L — ABNORMAL HIGH (ref 44–121)
BUN/Creatinine Ratio: 18 (ref 10–24)
BUN: 12 mg/dL (ref 8–27)
Bilirubin Total: 0.4 mg/dL (ref 0.0–1.2)
CO2: 20 mmol/L (ref 20–29)
Calcium: 9.3 mg/dL (ref 8.6–10.2)
Chloride: 100 mmol/L (ref 96–106)
Creatinine, Ser: 0.67 mg/dL — ABNORMAL LOW (ref 0.76–1.27)
Globulin, Total: 3.1 g/dL (ref 1.5–4.5)
Glucose: 119 mg/dL — ABNORMAL HIGH (ref 70–99)
Potassium: 4.6 mmol/L (ref 3.5–5.2)
Sodium: 136 mmol/L (ref 134–144)
Total Protein: 7 g/dL (ref 6.0–8.5)
eGFR: 96 mL/min/{1.73_m2} (ref >59)

## 2023-01-12 LAB — LIPID PANEL W/O CHOL/HDL RATIO
Cholesterol, Total: 171 mg/dL (ref 100–199)
HDL: 40 mg/dL (ref >39)
LDL Chol Calc (NIH): 107 mg/dL — ABNORMAL HIGH (ref 0–99)
Triglycerides: 133 mg/dL (ref 0–149)
VLDL Cholesterol Cal: 24 mg/dL (ref 5–40)

## 2023-01-12 LAB — TSH: TSH: 5.28 u[IU]/mL — ABNORMAL HIGH (ref 0.450–4.500)

## 2023-01-18 ENCOUNTER — Other Ambulatory Visit: Payer: Self-pay | Admitting: Family Medicine

## 2023-01-18 DIAGNOSIS — R7989 Other specified abnormal findings of blood chemistry: Secondary | ICD-10-CM

## 2023-01-20 ENCOUNTER — Encounter: Payer: Self-pay | Admitting: Family Medicine

## 2023-01-20 NOTE — Assessment & Plan Note (Signed)
Continue to keep BP and cholesterol under good control. Continue to monitor. Continue to follow with cardiology. Call with any concerns.

## 2023-01-20 NOTE — Assessment & Plan Note (Signed)
Stable. Continue to follow with pulmonology. Call with any concerns. Continue to monitor.  

## 2023-01-20 NOTE — Assessment & Plan Note (Signed)
Checked at endocrinology in September. Under good control with A1c of 6.5. Continue current regimen. Call with any concerns.

## 2023-01-20 NOTE — Assessment & Plan Note (Signed)
Reassured patient. Continue to monitor.  

## 2023-01-20 NOTE — Assessment & Plan Note (Signed)
Under good control on current regimen. Continue current regimen. Continue to monitor. Call with any concerns. Refills given. Labs drawn today.   

## 2023-01-20 NOTE — Assessment & Plan Note (Signed)
Rechecking labs today. Await results. Treat as needed.  °

## 2023-02-18 ENCOUNTER — Other Ambulatory Visit: Payer: Medicare Other

## 2023-02-19 ENCOUNTER — Other Ambulatory Visit: Payer: Self-pay | Admitting: Family Medicine

## 2023-02-19 ENCOUNTER — Telehealth: Payer: Self-pay | Admitting: Family Medicine

## 2023-02-19 ENCOUNTER — Ambulatory Visit: Payer: Medicare Other | Admitting: Podiatry

## 2023-02-19 ENCOUNTER — Telehealth: Payer: Self-pay

## 2023-02-19 DIAGNOSIS — R7989 Other specified abnormal findings of blood chemistry: Secondary | ICD-10-CM

## 2023-02-19 DIAGNOSIS — E039 Hypothyroidism, unspecified: Secondary | ICD-10-CM

## 2023-02-19 NOTE — Telephone Encounter (Signed)
Copied from CRM [id number]. Topic: General - Call Back - No Documentation >> Feb 19, 2023 10:26 AM Payton Doughty wrote: Reason for CRM: pt was unable to get the labs that were ordered by Dr Daron Offer..  however pt was  the hospital yesterday, they did the lab Dr Laural Benes wanted

## 2023-02-19 NOTE — Telephone Encounter (Signed)
Patient will call and schedule an appointment.  

## 2023-02-19 NOTE — Patient Instructions (Signed)
Visit Information  Thank you for taking time to visit with me today. Please don't hesitate to contact me if I can be of assistance to you before our next scheduled telephone appointment.  Our next appointment is by telephone on 12/18 at 10:30  Following is a copy of your care plan:   Goals Addressed             This Visit's Progress    TOC Care Plan       Current Barriers:  Knowledge Deficits related to plan of care for management of CHF  Chronic Disease Management support and education needs related to CHF   RNCM Clinical Goal(s):  Patient will work with the Care Management team over the next 30 days to address Transition of Care Barriers: Medication Management Support at home Provider appointments take all medications exactly as prescribed and will call provider for medication related questions as evidenced by no missed medications no uncontrolled side effects or unmanaged symptoms  attend all scheduled medical appointments: with Cardiology, HF Clinic PCP as evidenced by no missed appointments   through collaboration with RN Care manager, provider, and care team.   Interventions: Evaluation of current treatment plan related to  self management and patient's adherence to plan as established by provider  Transitions of Care:  New goal. Doctor Visits  - discussed the importance of doctor visits  Heart Failure Interventions:  (Status:  New goal.) Short Term Goal Basic overview and discussion of pathophysiology of Heart Failure reviewed Provided education on low sodium diet Provided education about placing scale on hard, flat surface Advised patient to weigh each morning after emptying bladder Discussed importance of daily weight and advised patient to weigh and record daily Reviewed role of diuretics in prevention of fluid overload and management of heart failure; Discussed the importance of keeping all appointments with provider  Patient Goals/Self-Care  Activities: Participate in Transition of Care Program/Attend [facility] scheduled calls Take all medications as prescribed Attend all scheduled provider appointments Call pharmacy for medication refills 3-7 days in advance of running out of medications Perform all self care activities independently  Perform IADL's (shopping, preparing meals, housekeeping, managing finances) independently Call provider office for new concerns or questions  call office if I gain more than 2 pounds in one day or 5 pounds in one week keep legs up while sitting track weight in diary use salt in moderation watch for swelling in feet, ankles and legs every day weigh myself daily follow rescue plan if symptoms flare-up track symptoms and what helps feel better or worse  Follow Up Plan:  The patient has been provided with contact information for the care management team and has been advised to call with any health related questions or concerns.          Patient verbalizes understanding of instructions and care plan provided today and agrees to view in MyChart. Active MyChart status and patient understanding of how to access instructions and care plan via MyChart confirmed with patient.     The patient has been provided with contact information for the care management team and has been advised to call with any health related questions or concerns.   Please call the care guide team at [phone] if you need to cancel or reschedule your appointment.   Please call the [place] National Suicide Prevention Lifeline: 475-840-8611 or TTY: 330-662-3844 TTY 438-811-4597) to talk to a trained counselor if you are experiencing a Mental Health or Behavioral Health Crisis or need someone  to talk to.  Susa Loffler , BSN, RN Care Management Coordinator [place]   [facility] [email or web address] Direct Dial: [phone]

## 2023-02-19 NOTE — Telephone Encounter (Signed)
They did not draw a thyroid lab at the hospital. Can we please get him scheduled for a lab visit and a hospital follow up.   TOC call please

## 2023-02-19 NOTE — Telephone Encounter (Signed)
Needs TOC call please

## 2023-02-19 NOTE — Transitions of Care (Post Inpatient/ED Visit) (Signed)
02/19/2023  Name: Samuel Morris MRN: [id number] DOB: 1946-01-06  Today's TOC FU Call Status: Today's TOC FU Call Status:: Successful TOC FU Call Completed TOC FU Call Complete Date: 02/19/23 Patient's Name and Date of Birth confirmed.  Transition Care Management Follow-up Telephone Call Date of Discharge: 02/18/23 Discharge Facility: Other [facility]) Name of Other (Non[facility]) Discharge Facility: [facility] Type of Discharge: Inpatient Admission Primary Inpatient Discharge Diagnosis:: AVR Heart Failure How have you been since you were released from the hospital?: Better Any questions or concerns?: No  Items Reviewed: Medications obtained,verified, and reconciled?: Yes (Medications Reviewed) Any new allergies since your discharge?: No Dietary orders reviewed?: Yes Type of Diet Ordered:: Reg, NAS Heart Healthy Do you have support at home?: Yes People in Home: spouse, grandchild(ren) Name of Support/Comfort Primary Source: Wife [name] is a Engineer, civil (consulting)  Medications Reviewed Today: Medications Reviewed Today     Reviewed by Johnnette Barrios, RN (Registered Nurse) on 02/19/23 at 1414  Med List Status: <None>   Medication Order Taking? Sig Documenting Provider Last Dose Status Informant  albuterol (PROVENTIL) (2.5 MG/3ML) 0.083% nebulizer solution [id number] Yes Take 3 mLs (2.5 mg total) by nebulization every 6 (six) hours as needed for wheezing or shortness of breath. Johnson, Megan P, DO Taking Active   albuterol (VENTOLIN HFA) 108 (90 Base) MCG/ACT inhaler [id number] Yes Inhale 1 puff into the lungs every 4 (four) hours as needed for wheezing or shortness of breath. Olevia Perches P, DO Taking Active   Ascorbic Acid (VITAMIN C PO) [id number] Yes Take 100 mg by mouth daily. [provider] Taking Active Self  aspirin EC 81 MG tablet [id number] Yes Take 81 mg by mouth daily. [provider] Taking Active Self  Calcium-Magnesium 100-50 MG TABS [id number] Yes Take 1 tablet  by mouth daily. [provider] Taking Active Self  Cholecalciferol (VITAMIN D-1000 MAX ST) 25 MCG (1000 UT) tablet [id number] Yes Take 1,000 Units by mouth daily. [provider] Taking Active Self  dapagliflozin propanediol (FARXIGA) 5 MG TABS tablet [id number] Yes Take 1 tablet (5 mg total) by mouth daily. Dorcas Carrow, DO Taking Active            Med Note Sharon Seller, Sebastain Fishbaugh L   Tue Feb 19, 2023  2:11 PM) Taking as ordered   ENTRESTO 49-51 MG [id number] Yes Take 1 tablet by mouth 2 (two) times daily. [provider] Taking Active Self           Med Note Sharon Seller, Elina Streng L   Tue Feb 19, 2023  2:11 PM)  24-26 mg Dose change   ezetimibe (ZETIA) 10 MG tablet [id number] Yes Take 1 tablet (10 mg total) by mouth daily. Johnson, Megan P, DO Taking Active   fluticasone (FLONASE) 50 MCG/ACT nasal spray [id number] Yes Place 1 spray into both nostrils daily. [provider] Taking Active   fluticasone furoate-vilanterol ([name] ELLIPTA) 100-25 MCG/ACT AEPB [id number] Yes Inhale 1 puff into the lungs daily. Olevia Perches P, DO Taking Active   folic acid (FOLVITE) 1 MG tablet [id number] Yes 1 tablet [provider] Taking Active   furosemide (LASIX) 40 MG tablet [id number] Yes Take 1 tablet (40 mg total) by mouth daily. Johnson, Megan P, DO Taking Active   gabapentin (NEURONTIN) 100 MG capsule [id number] Yes Take by mouth. [provider] Taking Active   hydroxychloroquine (PLAQUENIL) 200 MG tablet [id number] Yes Take 2 tablets (400 mg total) by mouth daily. Dx M06.9 Marjie Skiff, NP Taking Active  isosorbide mononitrate (IMDUR) 30 MG 24 hr tablet [id number] Yes TAKE 1/2 OF A TABLET (15 MG TOTAL) BY MOUTH DAILY Johnson, Megan P, DO Taking Active   methotrexate (RHEUMATREX) 2.5 MG tablet [id number] No Take 6 tablets (15 MG) by mouth weekly for Rheumatoid Arthritis. Dx M06.9  Patient not taking: Reported on 02/19/2023   Aura Dials T, NP Not Taking Active    metoprolol succinate (TOPROL-XL) 100 MG 24 hr tablet [id number] Yes TAKE 1 AND 1/2 TABLETS BY MOUTH DAILY WITH OR IMMEDIATELY FOLLOWING A MEAL Johnson, Megan P, DO Taking Active   mexiletine (MEXITIL) 150 MG capsule [id number] Yes Take 150 mg by mouth 2 (two) times daily. [provider] Taking Active   nitroGLYCERIN (NITROSTAT) 0.4 MG SL tablet [id number] Yes Place 1 tablet (0.4 mg total) under the tongue every 5 (five) minutes as needed for chest pain. Antonieta Iba, MD Taking Active   rivaroxaban (XARELTO) 20 MG TABS tablet [id number] Yes Take by mouth. [provider] Taking Active   rosuvastatin (CRESTOR) 20 MG tablet [id number] Yes Take 1 tablet (20 mg total) by mouth at bedtime. Olevia Perches P, DO Taking Active   spironolactone (ALDACTONE) 25 MG tablet [id number] Yes Take 1 tablet (25 mg total) by mouth daily. Olevia Perches P, DO Taking Active   Turmeric (QC TUMERIC COMPLEX PO) [id number] Yes Take by mouth daily. Unsure of dose [provider] Taking Active Self  vitamin B-12 (CYANOCOBALAMIN) 1000 MCG tablet [id number] Yes Take by mouth daily. [provider] Taking Active Self            Home Care and Equipment/Supplies: Were Home Health Services Ordered?: No Any new equipment or medical supplies ordered?: No  Functional Questionnaire: Do you need assistance with bathing/showering or dressing?: No Do you need assistance with meal preparation?: No Do you need assistance with eating?: No Do you have difficulty maintaining continence: No Do you need assistance with getting out of bed/getting out of a chair/moving?: No Do you have difficulty managing or taking your medications?: No  Follow up appointments reviewed: PCP Follow-up appointment confirmed?: Yes Date of PCP follow-up appointment?: 03/12/23 Follow-up Provider: Olevia Perches Specialist [facility] Follow-up appointment confirmed?: Yes Date of Specialist follow-up appointment?:  02/26/23 Follow-Up Specialty Provider:: Cardiology Do you need transportation to your follow-up appointment?: No (He drives) Do you understand care options if your condition(s) worsen?: Yes-patient verbalized understanding  SDOH Interventions Today    Flowsheet Row Most Recent Value  SDOH Interventions   Food Insecurity Interventions Intervention Not Indicated  Housing Interventions Intervention Not Indicated  Transportation Interventions Intervention Not Indicated  Utilities Interventions Intervention Not Indicated       Goals Addressed             This Visit's Progress    TOC Care Plan       Current Barriers:  Knowledge Deficits related to plan of care for management of CHF  Chronic Disease Management support and education needs related to CHF   RNCM Clinical Goal(s):  Patient will work with the Care Management team over the next 30 days to address Transition of Care Barriers: Medication Management Support at home Provider appointments take all medications exactly as prescribed and will call provider for medication related questions as evidenced by no missed medications no uncontrolled side effects or unmanaged symptoms  attend all scheduled medical appointments: with Cardiology, HF Clinic PCP as evidenced by no missed appointments   through collaboration with RN Care manager, provider, and care team.  Interventions: Evaluation of current treatment plan related to  self management and patient's adherence to plan as established by provider  Transitions of Care:  New goal. Doctor Visits  - discussed the importance of doctor visits  Heart Failure Interventions:  (Status:  New goal.) Short Term Goal Basic overview and discussion of pathophysiology of Heart Failure reviewed Provided education on low sodium diet Provided education about placing scale on hard, flat surface Advised patient to weigh each morning after emptying bladder Discussed importance of daily weight and  advised patient to weigh and record daily Reviewed role of diuretics in prevention of fluid overload and management of heart failure; Discussed the importance of keeping all appointments with provider  Patient Goals/Self-Care Activities: Participate in Transition of Care Program/Attend [facility] scheduled calls Take all medications as prescribed Attend all scheduled provider appointments Call pharmacy for medication refills 3-7 days in advance of running out of medications Perform all self care activities independently  Perform IADL's (shopping, preparing meals, housekeeping, managing finances) independently Call provider office for new concerns or questions  call office if I gain more than 2 pounds in one day or 5 pounds in one week keep legs up while sitting track weight in diary use salt in moderation watch for swelling in feet, ankles and legs every day weigh myself daily follow rescue plan if symptoms flare-up track symptoms and what helps feel better or worse  Follow Up Plan:  The patient has been provided with contact information for the care management team and has been advised to call with any health related questions or concerns.           Patient is at high risk for readmission and/or has history of  high utilization  Discussed [facility]  TOC program and weekly calls to patient to assess condition/status, medication management  and provide support/education as indicated . Patient/ Caregiver voiced understanding and is  agreeable to 30 day program   Susa Loffler , Scientist, research (physical sciences), RN Care Management Coordinator [place]   [facility] [email or web address] Direct Dial: [phone]

## 2023-02-27 ENCOUNTER — Other Ambulatory Visit: Payer: Self-pay

## 2023-02-27 NOTE — Patient Instructions (Signed)
Visit Information  Thank you for taking time to visit with me today. Please don't hesitate to contact me if I can be of assistance to you before our next scheduled telephone appointment.  Our next appointment is by telephone on 12/24 at 10:30am  Following is a copy of your care plan:   Goals Addressed             This Visit's Progress    TOC Care Plan       Current Barriers:  Knowledge Deficits related to plan of care for management of CHF  Chronic Disease Management support and education needs related to CHF   RNCM Clinical Goal(s):  Patient will work with the Care Management team over the next 30 days to address Transition of Care Barriers: Medication Management Support at home Provider appointments take all medications exactly as prescribed and will call provider for medication related questions as evidenced by no missed medications no uncontrolled side effects or unmanaged symptoms  attend all scheduled medical appointments: with Cardiology, HF Clinic PCP as evidenced by no missed appointments   through collaboration with RN Care manager, provider, and care team.   Interventions: Evaluation of current treatment plan related to  self management and patient's adherence to plan as established by provider  Transitions of Care:  Goal on track:  Yes. Doctor Visits  - discussed the importance of doctor visits  Heart Failure Interventions:  (Status:  Goal on track:  Yes.) Short Term Goal Basic overview and discussion of pathophysiology of Heart Failure reviewed Provided education on low sodium diet Provided education about placing scale on hard, flat surface Advised patient to weigh each morning after emptying bladder Discussed importance of daily weight and advised patient to weigh and record daily Reviewed role of diuretics in prevention of fluid overload and management of heart failure; Discussed the importance of keeping all appointments with provider  Patient Goals/Self-Care  Activities: Participate in Transition of Care Program/Attend [facility] scheduled calls Take all medications as prescribed Attend all scheduled provider appointments Call pharmacy for medication refills 3-7 days in advance of running out of medications Perform all self care activities independently  Perform IADL's (shopping, preparing meals, housekeeping, managing finances) independently Call provider office for new concerns or questions  call office if I gain more than 2 pounds in one day or 5 pounds in one week keep legs up while sitting track weight in diary use salt in moderation watch for swelling in feet, ankles and legs every day weigh myself daily follow rescue plan if symptoms flare-up track symptoms and what helps feel better or worse  Follow Up Plan:  The patient has been provided with contact information for the care management team and has been advised to call with any health related questions or concerns.          Reviewed goals for care Patient/ Caregiver  verbalizes understanding of instructions and care plan provided. Patient / Caregiver was encouraged to make informed decisions about care, actively participate in managing health conditions, and implement lifestyle changes as needed to promote independence and self-management of health care   [facility] Case Management Nurse will provide follow-up and on-going assessment evaluation and education of disease processes, recommended interventions for both chronic and acute medical conditions ,  along with ongoing review of symptoms ,medication reviews and reconciliation during each weekly  call .  Any updates , inconsistencies, discrepancies or acute care concerns will be addressed and routed to the correct Practitioner if indicated  Please call the care guide team at [phone] , or call me directly at the number below if you need to cancel or reschedule your appointment. Three attempts will be made to reach patient if the scheduled  call is missed. If we are unable to reach the patient after 3 attempts the case will be closed and no additional outreach attempts will be made.   If you are experiencing a medical emergency, please call 911 or report to your local emergency department or urgent care.    If you have a non-emergency medical problem during routine business hours, please contact your provider's office and ask to speak with a nurse.    If you are experiencing a Mental Health or Behavioral Health Crisis or need someone to talk to Please call the Suicide and Crisis Lifeline: 63 You may also call the [place] National Suicide Prevention Lifeline: 226-528-9109 or TTY: (873)423-0922 TTY 214-525-3375) to talk to a trained counselor.  Additionally you may call the Behavioral Health Crisis Line at 314 630 7701, at any time, 24 hours a day, 7 days a week- however If you are in danger or need immediate medical attention, call 911.   If you would like help to quit smoking, call 1-800-QUIT-NOW ( (705) 801-2894) OR Espaol: 1-855-Djelo-Ya (4-742-595-6387) o para ms informacin haga clic aqu or Text READY to 564-332 to register via text.  Susa Loffler , BSN, RN Care Management Coordinator [place]   [facility] [email or web address] Direct Dial: [phone]

## 2023-02-27 NOTE — Patient Outreach (Signed)
Care Management  Transitions of Care Program Transitions of Care Post-discharge week 2   02/27/2023 Name: Samuel Morris MRN: [id number] DOB: [birth year]  Subjective: Samuel Morris is a 77 y.o. year old male who is a primary care patient of Dorcas Carrow, DO. The Care Management team Engaged with patient Engaged with patient by telephone to assess and address transitions of care needs.   Consent to Services:  Patient was given information about care management services, agreed to services, and gave verbal consent to participate.   Assessment:   Patient voices no new complaints Patient has not developed/ reported any new Medical issues / Dx or acute changes.- since last follow-up call for most recent  Hospital stay    12/4-12/9/ 2024  He is doing vey well. He sounds much better,States he is feeling better, has lost additional wieght he checks weight O2 sats and B/P daily, spouse is a nurse  He was seen by Cardiology 12/16 No med changes He continues on a tapering Prednisone until seen by Pulmonology 12/31. He had no complaints and is back to usual routine  Patient educated on red flag s/s to watch for and was encouraged to report, any changes in baseline or  medication regimen,  changes in health status  /  well-being, safety concerns  or any new unmanaged side effects or symptoms not relieved with interventions  to PCP and / or the  [facility] Case Management team      Full medication reconciliation/ review completed; updating medication list in EHR updated if indicated based on most recent discharge summary medication list; confirmed patient obtained / is taking all newly prescribed medications as instructed; Aware of changes to previous medications and dosage adjustments. Patient self-caregiver manages medications; denies questions/ concerns around medications today.    SDOH Interventions    Flowsheet Row Telephone from 02/19/2023 in [place] POPULATION HEALTH DEPARTMENT Care Coordination from  06/18/2022 in [facility] Telephone from 03/07/2022 in [facility] Community Care Coordination Chronic Care Management from 12/18/2021 in [facility] Chronic Care Management from 09/18/2021 in [facility] Clinical Support from 04/03/2021 in [facility]  SDOH Interventions        Food Insecurity Interventions Intervention Not Indicated -- -- -- -- Intervention Not Indicated  Housing Interventions Intervention Not Indicated -- Intervention Not Indicated -- -- Intervention Not Indicated  Transportation Interventions Intervention Not Indicated Intervention Not Indicated Intervention Not Indicated Intervention Not Indicated -- Intervention Not Indicated  Utilities Interventions Intervention Not Indicated -- Intervention Not Indicated -- -- --  Financial Strain Interventions -- Other (Comment)  [PAP's] -- Other (Comment)  [PAP] Other (Comment)  [PAP] Intervention Not Indicated  Physical Activity Interventions -- -- -- -- -- Intervention Not Indicated  Stress Interventions -- -- -- -- -- Intervention Not Indicated  Social Connections Interventions -- -- -- -- -- Intervention Not Indicated        Goals Addressed             This Visit's Progress    TOC Care Plan       Current Barriers:  Knowledge Deficits related to plan of care for management of CHF  Chronic Disease Management support and education needs related to CHF   RNCM Clinical Goal(s):  Patient will work with the Care Management team over the next 30 days to address Transition of Care Barriers: Medication Management Support at home Provider appointments take all medications exactly as prescribed and  will call provider for medication related questions as evidenced by no missed medications no uncontrolled side effects or unmanaged symptoms  attend all scheduled medical appointments: with Cardiology, HF Clinic PCP as evidenced by no missed  appointments   through collaboration with RN Care manager, provider, and care team.   Interventions: Evaluation of current treatment plan related to  self management and patient's adherence to plan as established by provider  Transitions of Care:  Goal on track:  Yes. Doctor Visits  - discussed the importance of doctor visits  Heart Failure Interventions:  (Status:  Goal on track:  Yes.) Short Term Goal Basic overview and discussion of pathophysiology of Heart Failure reviewed Provided education on low sodium diet Provided education about placing scale on hard, flat surface Advised patient to weigh each morning after emptying bladder Discussed importance of daily weight and advised patient to weigh and record daily Reviewed role of diuretics in prevention of fluid overload and management of heart failure; Discussed the importance of keeping all appointments with provider  Patient Goals/Self-Care Activities: Participate in Transition of Care Program/Attend [facility] scheduled calls Take all medications as prescribed Attend all scheduled provider appointments Call pharmacy for medication refills 3-7 days in advance of running out of medications Perform all self care activities independently  Perform IADL's (shopping, preparing meals, housekeeping, managing finances) independently Call provider office for new concerns or questions  call office if I gain more than 2 pounds in one day or 5 pounds in one week keep legs up while sitting track weight in diary use salt in moderation watch for swelling in feet, ankles and legs every day weigh myself daily follow rescue plan if symptoms flare-up track symptoms and what helps feel better or worse  Follow Up Plan:  The patient has been provided with contact information for the care management team and has been advised to call with any health related questions or concerns.          Plan:   Routine follow-up and on-going assessment evaluation  and education of disease processes, recommended interventions for both chronic and acute medical conditions , will occur during each weekly visit along with ongoing review of symptoms ,medication reviews and reconciliation. Any updates , inconsistencies, discrepancies or acute care concerns will be addressed and routed to the correct Practitioner if indicated   Based on current information and Insurance plan -Reviewed benefits available to patient, including details about eligibility options for care if any area of needs were identified.  Reviewed patients ability to access and / or navigating the benefits system..Amb Referral made if indicted , refer to orders section of note for details   Please refer to Care Plan for goals and interventions -Effectiveness of interventions, symptom management and outcomes will be evaluated  weekly during [facility] 30-day Program Outreach calls  . Any necessary  changes and updates to Care Plan will be completed episodically    Reviewed goals for care Patient verbalizes understanding of instructions and care plan provided. Patient was encouraged to make informed decisions about their care, actively participate in managing their health condition, and implement lifestyle changes as needed to promote independence and self-management of health care    The patient has been provided with contact information for the care management team and has been advised to call with any health-related questions or concerns.  Telephone follow up appointment with care management team member scheduled for: The patient has been provided with contact information for the care management team and has been  advised to call with any health related questions or concerns.   Susa Loffler , BSN, RN Care Management Coordinator [place]   [facility] [email or web address] Direct Dial: [phone]

## 2023-03-05 ENCOUNTER — Other Ambulatory Visit: Payer: Medicare Other

## 2023-03-05 ENCOUNTER — Other Ambulatory Visit: Payer: Self-pay

## 2023-03-05 NOTE — Patient Outreach (Signed)
  Care Management  Transitions of Care Program Transitions of Care Post-discharge week 3  03/05/2023 Name: Samuel Morris MRN: [id number] DOB: September 08, 1945  Subjective: Samuel Morris is a 77 y.o. year old male who is a primary care patient of Dorcas Carrow, DO. The Care Management team spoke with patient by telephone to assess and address transitions of care needs. Patients sister passed away 03-08-2023, he is attempting to speak with family and work out arrangements Will call patient after holidays and in few days so he has time for arrangements and to see family   Plan: Additional outreach attempts will be made to reach the patient enrolled in the [facility] Program (Post Inpatient/ED Visit).  Susa Loffler , BSN, RN Care Management Coordinator    [facility] [email or web address] Direct Dial: [phone]

## 2023-03-11 ENCOUNTER — Other Ambulatory Visit: Payer: Self-pay

## 2023-03-11 NOTE — Patient Instructions (Signed)
Visit Information  Thank you for taking time to visit with me today. Please don't hesitate to contact me if I can be of assistance to you before our next scheduled telephone appointment.  Our next appointment is by telephone on 03/20/23 at 1:00pm  Following is a copy of your care plan:   Goals Addressed             This Visit's Progress    TOC Care Plan       Current Barriers:  Knowledge Deficits related to plan of care for management of CHF  Chronic Disease Management support and education needs related to CHF   RNCM Clinical Goal(s):  Patient will work with the Care Management team over the next 30 days to address Transition of Care Barriers: Medication Management Support at home Provider appointments take all medications exactly as prescribed and will call provider for medication related questions as evidenced by no missed medications no uncontrolled side effects or unmanaged symptoms  attend all scheduled medical appointments: with Cardiology, HF Clinic PCP as evidenced by no missed appointments   through collaboration with RN Care manager, provider, and care team.   Interventions: Evaluation of current treatment plan related to  self management and patient's adherence to plan as established by provider  Transitions of Care:  Goal on track:  Yes. Doctor Visits  - discussed the importance of doctor visits- he is rescheduling his pulmonology appointment due to it being on new years eve and not wanting to be in [place],  Heart Failure Interventions:  (Status:  Goal on track:  Yes.) Short Term Goal Basic overview and discussion of pathophysiology of Heart Failure reviewed Provided education on low sodium diet Provided education about placing scale on hard, flat surface Advised patient to weigh each morning after emptying bladder Discussed importance of daily weight and advised patient to weigh and record daily Reviewed role of diuretics in prevention of fluid overload and  management of heart failure; Discussed the importance of keeping all appointments with provider  Patient Goals/Self-Care Activities: Participate in Transition of Care Program/Attend [facility] scheduled calls Take all medications as prescribed Attend all scheduled provider appointments Call pharmacy for medication refills 3-7 days in advance of running out of medications Perform all self care activities independently  Perform IADL's (shopping, preparing meals, housekeeping, managing finances) independently Call provider office for new concerns or questions  call office if I gain more than 2 pounds in one day or 5 pounds in one week keep legs up while sitting track weight in diary use salt in moderation watch for swelling in feet, ankles and legs every day weigh myself daily follow rescue plan if symptoms flare-up track symptoms and what helps feel better or worse  Follow Up Plan:  The patient has been provided with contact information for the care management team and has been advised to call with any health related questions or concerns.          Reviewed goals for care Patient/ Caregiver  verbalizes understanding of instructions and care plan provided. Patient / Caregiver was encouraged to make informed decisions about care, actively participate in managing health conditions, and implement lifestyle changes as needed to promote independence and self-management of their medical care   [facility] Case Management Nurse will provide follow-up and on-going assessment ,evaluation and education of disease processes, recommended interventions for both chronic and acute medical conditions ,  along with ongoing review of symptoms ,medication reviews / reconciliation during each weekly call . Any updates ,  inconsistencies, discrepancies or acute care concerns will be addressed and routed to the correct Practitioner if indicated    Please call the care guide team at [phone]  if you need to cancel or  reschedule your appointment . For scheduled calls -Three attempts will be made to reach you if the scheduled call is missed or  we are unable to reach the you  after 3 attempts the case will be closed and no additional outreach attempts will be made.   If you need to speak to a Nurse you may  call me directly at the number below or if I am unavailable,and  your need is urgent  please call the main [facility] number at [phone] and ask to speak with one of the [facility] ( Transition of Care )  Nurses  .     Additionally, If you experience worsening of your symptoms, develop shortness of breath, If you are experiencing a medical emergency,  develop suicidal or homicidal thoughts you must seek medical attention immediately by calling 911 or report to your local emergency department or urgent care.   If you have a non-emergency medical problem during routine business hours, please contact your provider's office and ask to speak with a nurse.       Please take the time to read instructions/literature along with the possible adverse reactions/side effects for all the Medicines that have been prescribed to you. Only take newly prescribed  Medications after you have completely understood and accept all the possible adverse reactions/side effects.   Do not take more than prescribed Medications for  Pain, Sleep and Anxiety. Do not drive when taking Pain medications or sleep aid/ insomnia  medications It is not advisable to combine anxiety, sleep and pain medications without talking with your primary care practitioner    If you are experiencing a Mental Health or Behavioral Health Crisis or need someone to talk to Please call the Suicide and Crisis Lifeline: 988 You may also call the [place] National Suicide Prevention Lifeline: 214-281-9555 or TTY: (972)018-9998 TTY (720)257-2540) to talk to a trained counselor.  You may call the Behavioral Health Crisis Line at (332)515-9398, at any time, 24 hours a day, 7 days a  week- however If you are in danger or need immediate medical attention, call 911.   If you would like help to quit smoking, call 1-800-QUIT-NOW ( 713-317-7358) OR Espaol: 1-855-Djelo-Ya (7-564-332-9518) o para ms informacin haga clic aqu or Text READY to 841-660 to register via text.   Susa Loffler , BSN, RN Care Management Coordinator [place]   [facility] [email or web address] Direct Dial: [phone]

## 2023-03-11 NOTE — Patient Outreach (Signed)
Care Management  Transitions of Care Program Transitions of Care Post-discharge week 3   03/11/2023 Name: Samuel Morris MRN: [id number] DOB: 24-Oct-1945  Subjective: Samuel Morris is a 77 y.o. year old male who is a primary care patient of Dorcas Carrow, DO. The Care Management team Engaged with patient Engaged with patient by telephone to assess and address transitions of care needs.   Consent to Services:  Patient was given information about care management services, agreed to services, and gave verbal consent to participate.   Assessment:   Patient voices no new complaints Patient has not developed/ reported any new Medical issues / Dx or acute changes.- since last follow-up call for most recent  Hospital stay    12/4-12/9/ 2024  He is doing great. Continues ro taper Prednisone He decreases and maintains 20 mg 12/31 He was scheduled  to have a Pulmonology f/u 12/31 He is planning on rescheduling as he does not want to be in [place] on [place] Years Eve . He is checking his weight daily 153 today, He monitors his B/P ( 113/78)  He checks pulse ox readings ( 97% RA) and recently bought a 6 lead EKG monitor. He manages meds well and spouse is a Engineer, civil (consulting)  Patient educated on red flag s/s to watch for and was encouraged to report, any changes in baseline or  medication regimen,  changes in health status  /  well-being, safety concerns  or any new unmanaged side effects or symptoms not relieved with interventions  to PCP and / or the  [facility] Case Management team          SDOH Interventions    Flowsheet Row Patient Outreach from 03/11/2023 in [place] POPULATION HEALTH DEPARTMENT Telephone from 02/19/2023 in [place] POPULATION HEALTH DEPARTMENT Care Coordination from 06/18/2022 in [facility] Telephone from 03/07/2022 in [facility] Care Coordination Chronic Care Management from 12/18/2021 in [facility] Chronic Care Management from 09/18/2021  in [facility]  SDOH Interventions        Food Insecurity Interventions -- Intervention Not Indicated -- -- -- --  Housing Interventions -- Intervention Not Indicated -- Intervention Not Indicated -- --  Transportation Interventions -- Intervention Not Indicated Intervention Not Indicated Intervention Not Indicated Intervention Not Indicated --  Utilities Interventions -- Intervention Not Indicated -- Intervention Not Indicated -- --  Financial Strain Interventions -- -- Other (Comment)  [PAP's] -- Other (Comment)  [PAP] Other (Comment)  [PAP]  Social Connections Interventions Intervention Not Indicated -- -- -- -- --        Goals Addressed             This Visit's Progress    TOC Care Plan       Current Barriers:  Knowledge Deficits related to plan of care for management of CHF  Chronic Disease Management support and education needs related to CHF   RNCM Clinical Goal(s):  Patient will work with the Care Management team over the next 30 days to address Transition of Care Barriers: Medication Management Support at home Provider appointments take all medications exactly as prescribed and will call provider for medication related questions as evidenced by no missed medications no uncontrolled side effects or unmanaged symptoms  attend all scheduled medical appointments: with Cardiology, HF Clinic PCP as evidenced by no missed appointments   through collaboration with RN Care manager, provider, and care team.   Interventions: Evaluation of current treatment plan related  to  self management and patient's adherence to plan as established by provider  Transitions of Care:  Goal on track:  Yes. Doctor Visits  - discussed the importance of doctor visits- he is rescheduling his pulmonology appointment due to it being on new years eve and not wanting to be in [place],  Heart Failure Interventions:  (Status:  Goal on track:  Yes.) Short Term Goal Basic overview and  discussion of pathophysiology of Heart Failure reviewed Provided education on low sodium diet Provided education about placing scale on hard, flat surface Advised patient to weigh each morning after emptying bladder Discussed importance of daily weight and advised patient to weigh and record daily Reviewed role of diuretics in prevention of fluid overload and management of heart failure; Discussed the importance of keeping all appointments with provider  Patient Goals/Self-Care Activities: Participate in Transition of Care Program/Attend [facility] scheduled calls Take all medications as prescribed Attend all scheduled provider appointments Call pharmacy for medication refills 3-7 days in advance of running out of medications Perform all self care activities independently  Perform IADL's (shopping, preparing meals, housekeeping, managing finances) independently Call provider office for new concerns or questions  call office if I gain more than 2 pounds in one day or 5 pounds in one week keep legs up while sitting track weight in diary use salt in moderation watch for swelling in feet, ankles and legs every day weigh myself daily follow rescue plan if symptoms flare-up track symptoms and what helps feel better or worse  Follow Up Plan:  The patient has been provided with contact information for the care management team and has been advised to call with any health related questions or concerns.          Plan:   Next call  03/19/22 1PM  Routine follow-up and on-going assessment evaluation and education of disease processes, recommended interventions for both chronic and acute medical conditions , will occur during each weekly visit along with ongoing review of symptoms ,medication reviews and reconciliation. Any updates , inconsistencies, discrepancies or acute care concerns will be addressed and routed to the correct Practitioner if indicated   Based on current information and Insurance plan  -Reviewed benefits available to patient, including details about eligibility options for care if any area of needs were identified.  Reviewed patients ability to access and / or navigating the benefits system..Amb Referral made if indicted , refer to orders section of note for details   Please refer to Care Plan for goals and interventions -Effectiveness of interventions, symptom management and outcomes will be evaluated  weekly during [facility] 30-day Program Outreach calls  . Any necessary  changes and updates to Care Plan will be completed episodically    Reviewed goals for care Patient verbalizes understanding of instructions and care plan provided. Patient was encouraged to make informed decisions about their care, actively participate in managing their health condition, and implement lifestyle changes as needed to promote independence and self-management of health care    The patient has been provided with contact information for the care management team and has been advised to call with any health-related questions or concerns.  The patient has been provided with contact information for the care management team and has been advised to call with any health related questions or concerns.   Susa Loffler , BSN, RN Care Management Coordinator [place]   [facility] [email or web address] Direct Dial: [phone]

## 2023-03-20 ENCOUNTER — Other Ambulatory Visit: Payer: Self-pay

## 2023-03-20 NOTE — Patient Instructions (Signed)
 Visit Information  Thank you for taking time to visit with me today. Please don't hesitate to contact me if I can be of assistance to you before our next scheduled telephone appointment.  Our next appointment is by telephone on 03/28/23 at 2:00pm   Following is a copy of your care plan:   Goals Addressed             This Visit's Progress    TOC Care Plan       Current Barriers:  Knowledge Deficits related to plan of care for management of CHF  Chronic Disease Management support and education needs related to CHF   RNCM Clinical Goal(s):  Patient will work with the Care Management team over the next 30 days to address Transition of Care Barriers: Medication Management Support at home Provider appointments take all medications exactly as prescribed and will call provider for medication related questions as evidenced by no missed medications no uncontrolled side effects or unmanaged symptoms  attend all scheduled medical appointments: with Cardiology, HF Clinic PCP and Rheumatology  as evidenced by no missed appointments  through collaboration with RN Care manager, provider, and care team.   Interventions: Evaluation of current treatment plan related to  self management and patient's adherence to plan as established by provider  Transitions of Care:  Goal on track:  Yes. Doctor Visits  - discussed the importance of doctor visits- he is rescheduling his pulmonology appointment due to it being on new years eve and not wanting to be in [place],  Heart Failure Interventions:  (Status:  Goal on track:  Yes.) Short Term Goal Basic overview and discussion of pathophysiology of Heart Failure reviewed Provided education on low sodium diet Provided education about placing scale on hard, flat surface Advised patient to weigh each morning after emptying bladder Discussed importance of daily weight and advised patient to weigh and record daily Reviewed role of diuretics in prevention of fluid  overload and management of heart failure; Discussed the importance of keeping all appointments with provider  Patient Goals/Self-Care Activities: Participate in Transition of Care Program/Attend [facility] scheduled calls Take all medications as prescribed Attend all scheduled provider appointments Call pharmacy for medication refills 3-7 days in advance of running out of medications Perform all self care activities independently  Perform IADL's (shopping, preparing meals, housekeeping, managing finances) independently Call provider office for new concerns or questions  call office if I gain more than 2 pounds in one day or 5 pounds in one week keep legs up while sitting track weight in diary use salt in moderation watch for swelling in feet, ankles and legs every day weigh myself daily follow rescue plan if symptoms flare-up track symptoms and what helps feel better or worse  Follow Up Plan:  Telephone follow up appointment with care management team member scheduled for:  03/28/23 2:00pm  The patient has been provided with contact information for the care management team and has been advised to call with any health related questions or concerns.          Reviewed goals for care Patient/ Caregiver verbalizes understanding of instructions with the plan of care . The  Patient / Caregiver was encouraged to make informed decisions about care, actively participate in managing health conditions, and implement lifestyle changes as needed to promote independence and self-management of healthcare. SDOH screenings have been completed and addressed if indicted There are no reported barriers to care.    Follow-up Plan [facility] Case Management Nurse will provide  follow-up and on-going assessment ,evaluation and education of disease processes, recommended interventions for both chronic and acute medical conditions ,  along with ongoing review of symptoms ,medication reviews / reconciliation during each weekly  call . Any updates , inconsistencies, discrepancies or acute care concerns will be addressed and routed to the correct Practitioner if indicated   [facility]  Please call the care guide team at [phone]  if you need to cancel or reschedule your appointment . For scheduled calls -Three attempts will be made to reach you -if the scheduled call is missed or  we are unable to reach the you after 3 attempts no additional outreach attempts will be made and the TOC follow-up will be closed .   If you need to speak to a Nurse you may  call me directly at the number below or if I am unavailable,and  your need is urgent  please call the main [facility] number at [phone] and ask to speak with one of the [facility] ( Transition of Care )  Nurses  .                                                                               Additionally, If you experience worsening of your symptoms, develop shortness of breath, If you are experiencing a medical emergency,  develop suicidal or homicidal thoughts you must seek medical attention immediately by calling 911 or report to your local emergency department or urgent care.   If you have a non-emergency medical problem during routine business hours, please contact your provider's office and ask to speak with a nurse.       Please take the time to read instructions/literature along with the possible adverse reactions/side effects for all the Medicines that have been prescribed to you. Only take newly prescribed  Medications after you have completely understood and accept all the possible adverse reactions/side effects.   Do not take more than prescribed Medications for  Pain, Sleep and Anxiety. Do not drive when taking Pain medications or sleep aid/ insomnia  medications It is not advisable to combine anxiety, sleep and pain medications without talking with your primary care practitioner    If you are experiencing a Mental Health or Behavioral Health Crisis  or need someone to talk to Please call the Suicide and Crisis Lifeline: 12 You may also call the [place]  National Suicide Prevention Lifeline: (217) 754-1118 or TTY: 579-380-0014 TTY (860)005-4358) to talk to a trained counselor.  You may call the Behavioral Health Crisis Line at 6026259695, at any time, 24 hours a day, 7 days a week- however If you are in danger or need immediate medical attention, call 911.   If you would like help to quit smoking, call 1-800-QUIT-NOW ( 415-806-6800) OR Espaol: 1-855-Djelo-Ya (8-144-664-6430) o para ms informacin haga clic aqu or Text READY to 799-599 to register via text.   Bari Mayans , BSN, RN Care Management Coordinator [place]   [facility] [email or web address] Direct Dial: [phone]

## 2023-03-20 NOTE — Patient Outreach (Signed)
 Care Management  Transitions of Care Program Transitions of Care Post-discharge week 4   03/20/2023 Name: Samuel Morris MRN: [id number] DOB: [birth year]  Subjective: Samuel Morris is a 78 y.o. year old male who is a primary care patient of Vicci Duwaine SQUIBB, DO. The Care Management team Engaged with patient Engaged with patient by telephone to assess and address transitions of care needs.   Consent to Services:  Patient was given information about care management services, agreed to services, and gave verbal consent to participate.   Assessment:   Patient voices no new complaints Patient has not developed/ reported any new Medical issues / Dx or acute changes.- since last follow-up call for most recent  Hospital stay    12/4-12/9/ 2024  Overall doing well Was seen by Pulmonology last week Has new referral to see Rheumatologist, Rheumatologist is requesting record review prior to scheduling appointment Patient is calling  [facility] to request Medical records be released ASAP to facilitate an urgent appointment He continues daily weights toady's weight 155 and steady.  2mg  Na diet and he watching fluid intake. AICD placement and function WNL  with no recent concerns  Medication reconciliation / review completed based on most recent discharge summary and EHR medication list. Confirmed patient is taking all newly prescribed medications as instructed and is aware of any changes to and / or dosage adjustments to medication regimen. He continues n Prednisone  20 mg and Bactrim 800/ 40 as Prophylactic measure Patient denies questions at this time  and reports no barriers to medication adherence  Patient educated on red flag s/s to watch for and was encouraged to report, any changes in baseline or  medication regimen,  changes in health status  /  well-being, safety concerns  or any new unmanaged side effects or symptoms not relieved with interventions  to PCP and / or the  [facility] Case Management team            SDOH Interventions    Flowsheet Row Patient Outreach from 03/11/2023 in [place] POPULATION HEALTH DEPARTMENT Telephone from 02/19/2023 in [place] POPULATION HEALTH DEPARTMENT Care Coordination from 06/18/2022 in [facility] Telephone from 03/07/2022 in [facility] Care Coordination Chronic Care Management from 12/18/2021 in [facility] Chronic Care Management from 09/18/2021 in [facility]  SDOH Interventions        Food Insecurity Interventions -- Intervention Not Indicated -- -- -- --  Housing Interventions -- Intervention Not Indicated -- Intervention Not Indicated -- --  Transportation Interventions -- Intervention Not Indicated Intervention Not Indicated Intervention Not Indicated Intervention Not Indicated --  Utilities Interventions -- Intervention Not Indicated -- Intervention Not Indicated -- --  Financial Strain Interventions -- -- Other (Comment)  [PAP's] -- Other (Comment)  [PAP] Other (Comment)  [PAP]  Social Connections Interventions Intervention Not Indicated -- -- -- -- --        Goals Addressed             This Visit's Progress    TOC Care Plan       Current Barriers:  Knowledge Deficits related to plan of care for management of CHF  Chronic Disease Management support and education needs related to CHF   RNCM Clinical Goal(s):  Patient will work with the Care Management team over the next 30 days to address Transition of Care Barriers: Medication Management Support at home Provider appointments take all medications exactly as prescribed and will call provider  for medication related questions as evidenced by no missed medications no uncontrolled side effects or unmanaged symptoms  attend all scheduled medical appointments: with Cardiology, HF Clinic PCP and Rheumatology  as evidenced by no missed appointments  through collaboration with RN Care manager, provider, and care team.    Interventions: Evaluation of current treatment plan related to  self management and patient's adherence to plan as established by provider  Transitions of Care:  Goal on track:  Yes. Doctor Visits  - discussed the importance of doctor visits- he is rescheduling his pulmonology appointment due to it being on new years eve and not wanting to be in [place],  Heart Failure Interventions:  (Status:  Goal on track:  Yes.) Short Term Goal Basic overview and discussion of pathophysiology of Heart Failure reviewed Provided education on low sodium diet Provided education about placing scale on hard, flat surface Advised patient to weigh each morning after emptying bladder Discussed importance of daily weight and advised patient to weigh and record daily Reviewed role of diuretics in prevention of fluid overload and management of heart failure; Discussed the importance of keeping all appointments with provider  Patient Goals/Self-Care Activities: Participate in Transition of Care Program/Attend [facility] scheduled calls Take all medications as prescribed Attend all scheduled provider appointments Call pharmacy for medication refills 3-7 days in advance of running out of medications Perform all self care activities independently  Perform IADL's (shopping, preparing meals, housekeeping, managing finances) independently Call provider office for new concerns or questions  call office if I gain more than 2 pounds in one day or 5 pounds in one week keep legs up while sitting track weight in diary use salt in moderation watch for swelling in feet, ankles and legs every day weigh myself daily follow rescue plan if symptoms flare-up track symptoms and what helps feel better or worse  Follow Up Plan:  Telephone follow up appointment with care management team member scheduled for:  03/28/23 2:00pm  The patient has been provided with contact information for the care management team and has been advised to  call with any health related questions or concerns.          Plan:  Schedule appointment with rheumatology to consider biometric therapy and then ongoing follow-up with Pulmonology . Patient and spouse are requesting Medical Records be sent to Rheumatologist who is waiting to schedule patient once records have been received and reviewed  Routine follow-up and on-going assessment evaluation and education of disease processes, recommended interventions for both chronic and acute medical conditions , will occur during each weekly visit along with ongoing review of symptoms ,medication reviews and reconciliation. Any updates , inconsistencies, discrepancies or acute care concerns will be addressed and routed to the correct Practitioner if indicated   Based on current information and Insurance plan -Reviewed benefits available to patient, including details about eligibility options for care if any area of needs were identified.  Reviewed patients ability to access and / or navigating the benefits system..Amb Referral made if indicted , refer to orders section of note for details   Please refer to Care Plan for goals and interventions -Effectiveness of interventions, symptom management and outcomes will be evaluated  weekly during [facility] 30-day Program Outreach calls  . Any necessary  changes and updates to Care Plan will be completed episodically    Reviewed goals for care Patient verbalizes understanding of instructions and care plan provided. Patient was encouraged to make informed decisions about their care, actively participate in  managing their health condition, and implement lifestyle changes as needed to promote independence and self-management of health care    The patient has been provided with contact information for the care management team and has been advised to call with any health-related questions or concerns.   The patient has been provided with contact information for the care  management team and has been advised to call with any health related questions or concerns.   Bari Mayans , BSN, RN Care Management Coordinator [place]   [facility] [email or web address] Direct Dial: [phone]

## 2023-03-28 ENCOUNTER — Other Ambulatory Visit: Payer: Self-pay

## 2023-03-28 NOTE — Patient Outreach (Signed)
Care Management  Transitions of Care Program Transitions of Care Post-discharge Completion   03/28/2023 Name: Samuel Morris MRN: [id number] DOB: [birth year]  Subjective: Samuel Morris is a 78 y.o. year old male who is a primary care patient of Dorcas Carrow, DO. The Care Management team Engaged with patient Engaged with patient by telephone to assess and address transitions of care needs.   Consent to Services:  Patient was given information about care management services, agreed to services, and gave verbal consent to participate.   Assessment:   Patient/ Caregiver  voices no new complaints or concerns  and has not developed/ reported any new medical issues / Dx or acute changes. - since last follow-up call for most recent  Hospital stay     1/4-12/9/ 2024 He is doing well.He has had no medication changes. He continues daily weights, weight today 155 follows low Na diet, He will follow-up with Rheumatologist( 2/12) to consider medication changes . Biologics for treatment and to decrease Prednisone use . He is very peasant , engaging and has had an interesting life. His family is supportive He and his wife are raising their 59 yo [place] . Medication reconciliation / review completed based on most recent discharge summary and EHR medication list. Confirmed patient is taking all newly prescribed medications as instructed (any discrepancies are noted in review section)   Patient / Caregiver is aware of any changes to and / or  any dosage adjustments to medication regimen. Patient/ Caregiver denies questions at this time and reports no barriers to medication adherence  Patient / Caregiver educated on red flag s/s to watch for and was encouraged to report, any changes in baseline or  medication regimen,  changes in health status  /  well-being, safety concerns  or any new unmanaged side effects or symptoms not relieved with interventions  to PCP and / or the  [facility] Case Management team           SDOH Interventions    Flowsheet Row Patient Outreach from 03/11/2023 in [place] POPULATION HEALTH DEPARTMENT Telephone from 02/19/2023 in  POPULATION HEALTH DEPARTMENT Care Coordination from 06/18/2022 in [facility] Telephone from 03/07/2022 in [facility] Care Coordination Chronic Care Management from 12/18/2021 in [facility] Chronic Care Management from 09/18/2021 in [facility]  SDOH Interventions        Food Insecurity Interventions -- Intervention Not Indicated -- -- -- --  Housing Interventions -- Intervention Not Indicated -- Intervention Not Indicated -- --  Transportation Interventions -- Intervention Not Indicated Intervention Not Indicated Intervention Not Indicated Intervention Not Indicated --  Utilities Interventions -- Intervention Not Indicated -- Intervention Not Indicated -- --  Financial Strain Interventions -- -- Other (Comment)  [PAP's] -- Other (Comment)  [PAP] Other (Comment)  [PAP]  Social Connections Interventions Intervention Not Indicated -- -- -- -- --        Goals Addressed             This Visit's Progress    COMPLETED: TOC Care Plan          Plan:  The patient has successfully completed the 30-day TOC Program. Condition is stable No further acute needs identified at this time. Chronic conditions and ongoing care is  managed thru collaboration with  PCP,  Specialists and additional Healthcare Providers if indicated . Patient verbalized understanding of ongoing plan of care.  SDOH needs have been screened and interventions provided  if identified.  Reviewed current home medications -- provided education as needed.  Previously discussed rationale of use, how/when to take medications. Patient is aware of potential side effects, and was encouraged to notify PCP for any changes in condition or signs / symptoms not relieved  with interventions.   Patient will  call 911 for Medical Emergencies or Life -Threatening or report to a local emergency department or urgent care.   Patient was encouraged to Contact PCP  with any questions or concerns regarding ongoing  medical care, any  difficulty obtaining or picking up  prescriptions, any  changes or  worsening in  condition including signs / symptoms not relieved  with interventions  Patient had no additional questions or concerns at this time. Current needs addressed.   The patient has been provided with contact information for the care management team and has been advised to call with any health related questions or concerns.   Susa Loffler , BSN, RN [facility]   [facility] RN Care Manager Direct Dial [phone] Fax [phone] Website: [email or web address]

## 2023-03-28 NOTE — Patient Outreach (Signed)
  Care Management  Transitions of Care Program Transitions of Care Post-discharge Completion Week 5   03/28/2023 Name: Samuel Morris MRN: [id number] DOB: 26-Dec-1945  Subjective: Samuel Morris is a 78 y.o. year old male who is a primary care patient of Dorcas Carrow, DO. The Care Management team spoke with the family member (POA, Guardian, [place]) to assess and address transitions of care needs. Per spouse he was just eating lunch and will return call later today   Plan: Additional outreach attempts will be made to reach the patient enrolled in the [facility] Program (Post Inpatient/ED Visit).  Susa Loffler , BSN, RN [facility]   [facility] RN Care Manager Direct Dial [phone] Fax [phone] Website: [email or web address]

## 2023-05-09 LAB — HM DIABETES EYE EXAM

## 2023-07-11 ENCOUNTER — Ambulatory Visit: Payer: Self-pay | Admitting: Family Medicine

## 2023-08-05 ENCOUNTER — Other Ambulatory Visit: Payer: Self-pay | Admitting: Family Medicine

## 2023-08-05 DIAGNOSIS — J849 Interstitial pulmonary disease, unspecified: Secondary | ICD-10-CM

## 2023-08-08 NOTE — Telephone Encounter (Signed)
 Called pt to schedule appointment. Mailbox is full - unable to leave message.

## 2023-08-08 NOTE — Telephone Encounter (Signed)
 Courtesy refill. Patient will need an office visit for additional refills. Requested Prescriptions  Pending Prescriptions Disp Refills   albuterol  (PROVENTIL ) (2.5 MG/3ML) 0.083% nebulizer solution [Pharmacy Med Name: ALBUTEROL  SULFATE (2.5 MG/3ML) 0.08] 150 mL 0    Sig: USE 1 VIAL VIA NEBULIZER EVERY 6 HOURS AS NEEDED FOR WHEEZING OR SHORTNESS OF BREATH     Pulmonology:  Beta Agonists 2 Failed - 08/08/2023 12:19 PM      Failed - Valid encounter within last 12 months    Recent Outpatient Visits   None            Passed - Last BP in normal range    BP Readings from Last 1 Encounters:  01/11/23 129/77         Passed - Last Heart Rate in normal range    Pulse Readings from Last 1 Encounters:  01/11/23 (!) 53

## 2023-08-22 ENCOUNTER — Encounter: Payer: Self-pay | Admitting: Family Medicine

## 2023-08-27 ENCOUNTER — Ambulatory Visit

## 2023-09-02 IMAGING — CR DG CHEST 2V
1 series · 2 of 2 positions shown · non-contrast
Comparison: 3466

CLINICAL DATA: Chest pain

EXAM:
CHEST - 2 VIEW

[Series 1: dg chest 2 view · 0.14mm/px · 2 of 2 slices shown]
[im 1/2]
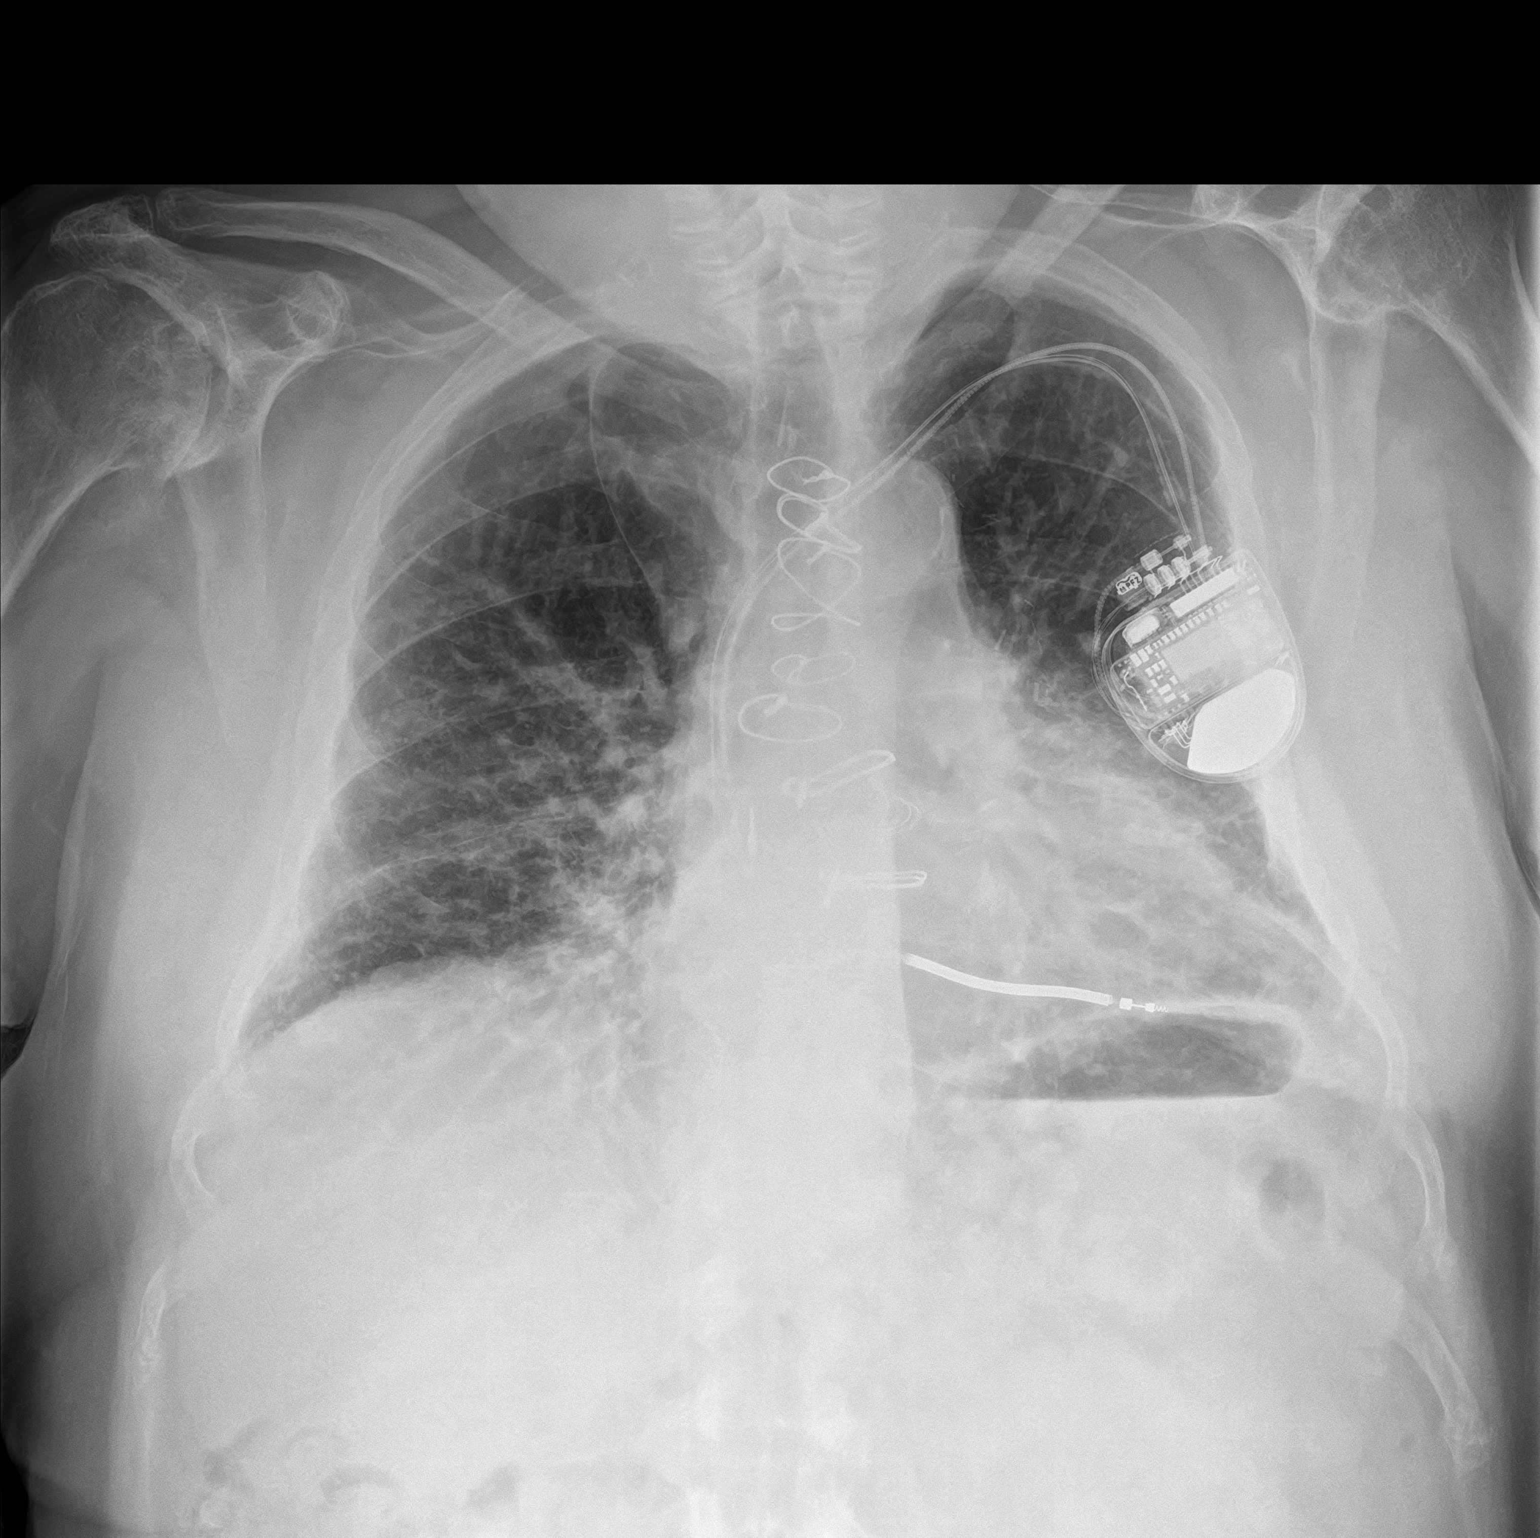
[im 2/2]
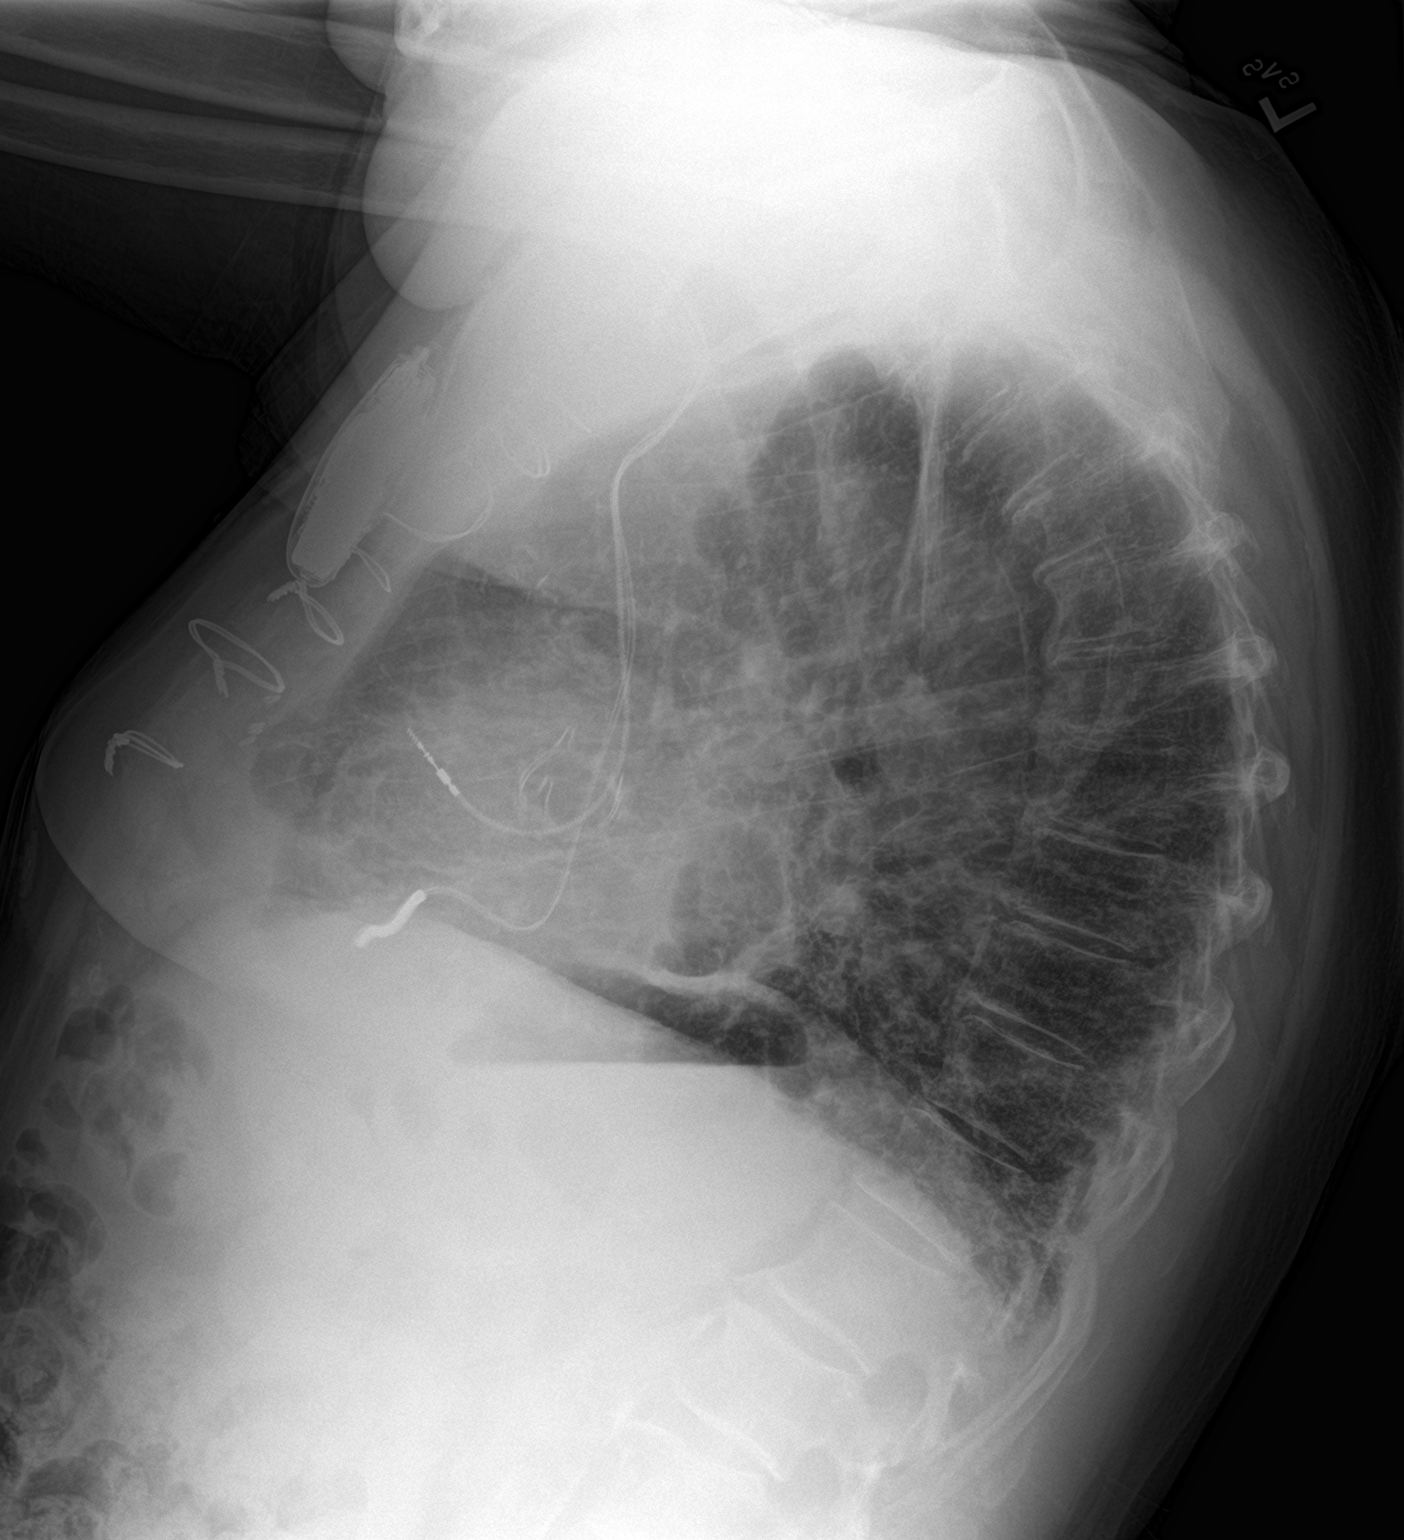

[2 of 2 positions shown; findings below may reference images not displayed]

FINDINGS: Chronic interstitial changes. No new consolidation or edema. No
pleural effusion. Stable mild cardiomegaly. Valve replacement. Left
chest wall ICD. No acute osseous abnormality.
IMPRESSION: No acute process in the chest. Chronic interstitial changes and
cardiomegaly.

## 2023-11-07 ENCOUNTER — Ambulatory Visit: Admitting: Podiatry

## 2023-11-15 LAB — HM DIABETES EYE EXAM

## 2023-11-27 ENCOUNTER — Encounter (HOSPITAL_BASED_OUTPATIENT_CLINIC_OR_DEPARTMENT_OTHER): Payer: Self-pay | Admitting: Internal Medicine

## 2023-11-27 DIAGNOSIS — R0683 Snoring: Secondary | ICD-10-CM

## 2023-11-27 DIAGNOSIS — R0681 Apnea, not elsewhere classified: Secondary | ICD-10-CM

## 2023-12-06 ENCOUNTER — Other Ambulatory Visit: Payer: Self-pay | Admitting: Family Medicine

## 2023-12-06 DIAGNOSIS — J849 Interstitial pulmonary disease, unspecified: Secondary | ICD-10-CM

## 2023-12-09 NOTE — Telephone Encounter (Signed)
 Too soon for refill, OV needed for refills.  Requested Prescriptions  Pending Prescriptions Disp Refills   albuterol  (PROVENTIL ) (2.5 MG/3ML) 0.083% nebulizer solution [Pharmacy Med Name: ALBUTEROL  SULFATE (2.5 MG/3ML) 0.08] 150 mL 0    Sig: USE 1 VIAL VIA NEBULIZER EVERY 6 HOURS AS NEEDED FOR WHEEZING OR SHORTNESS OF BREATH     Pulmonology:  Beta Agonists 2 Failed - 12/09/2023  1:45 PM      Failed - Valid encounter within last 12 months    Recent Outpatient Visits   None            Passed - Last BP in normal range    BP Readings from Last 1 Encounters:  01/11/23 129/77         Passed - Last Heart Rate in normal range    Pulse Readings from Last 1 Encounters:  01/11/23 (!) 53

## 2023-12-12 ENCOUNTER — Ambulatory Visit: Admitting: Family Medicine

## 2023-12-19 IMAGING — CR DG CHEST 2V
1 series · 2 of 2 positions shown · non-contrast
Comparison: 01/10/2021

CLINICAL DATA: Productive cough and wheezing, initial encounter

EXAM:
CHEST - 2 VIEW

[Series 1: dg chest 2 view · 0.14mm/px · 2 of 2 slices shown]
[im 1/2]
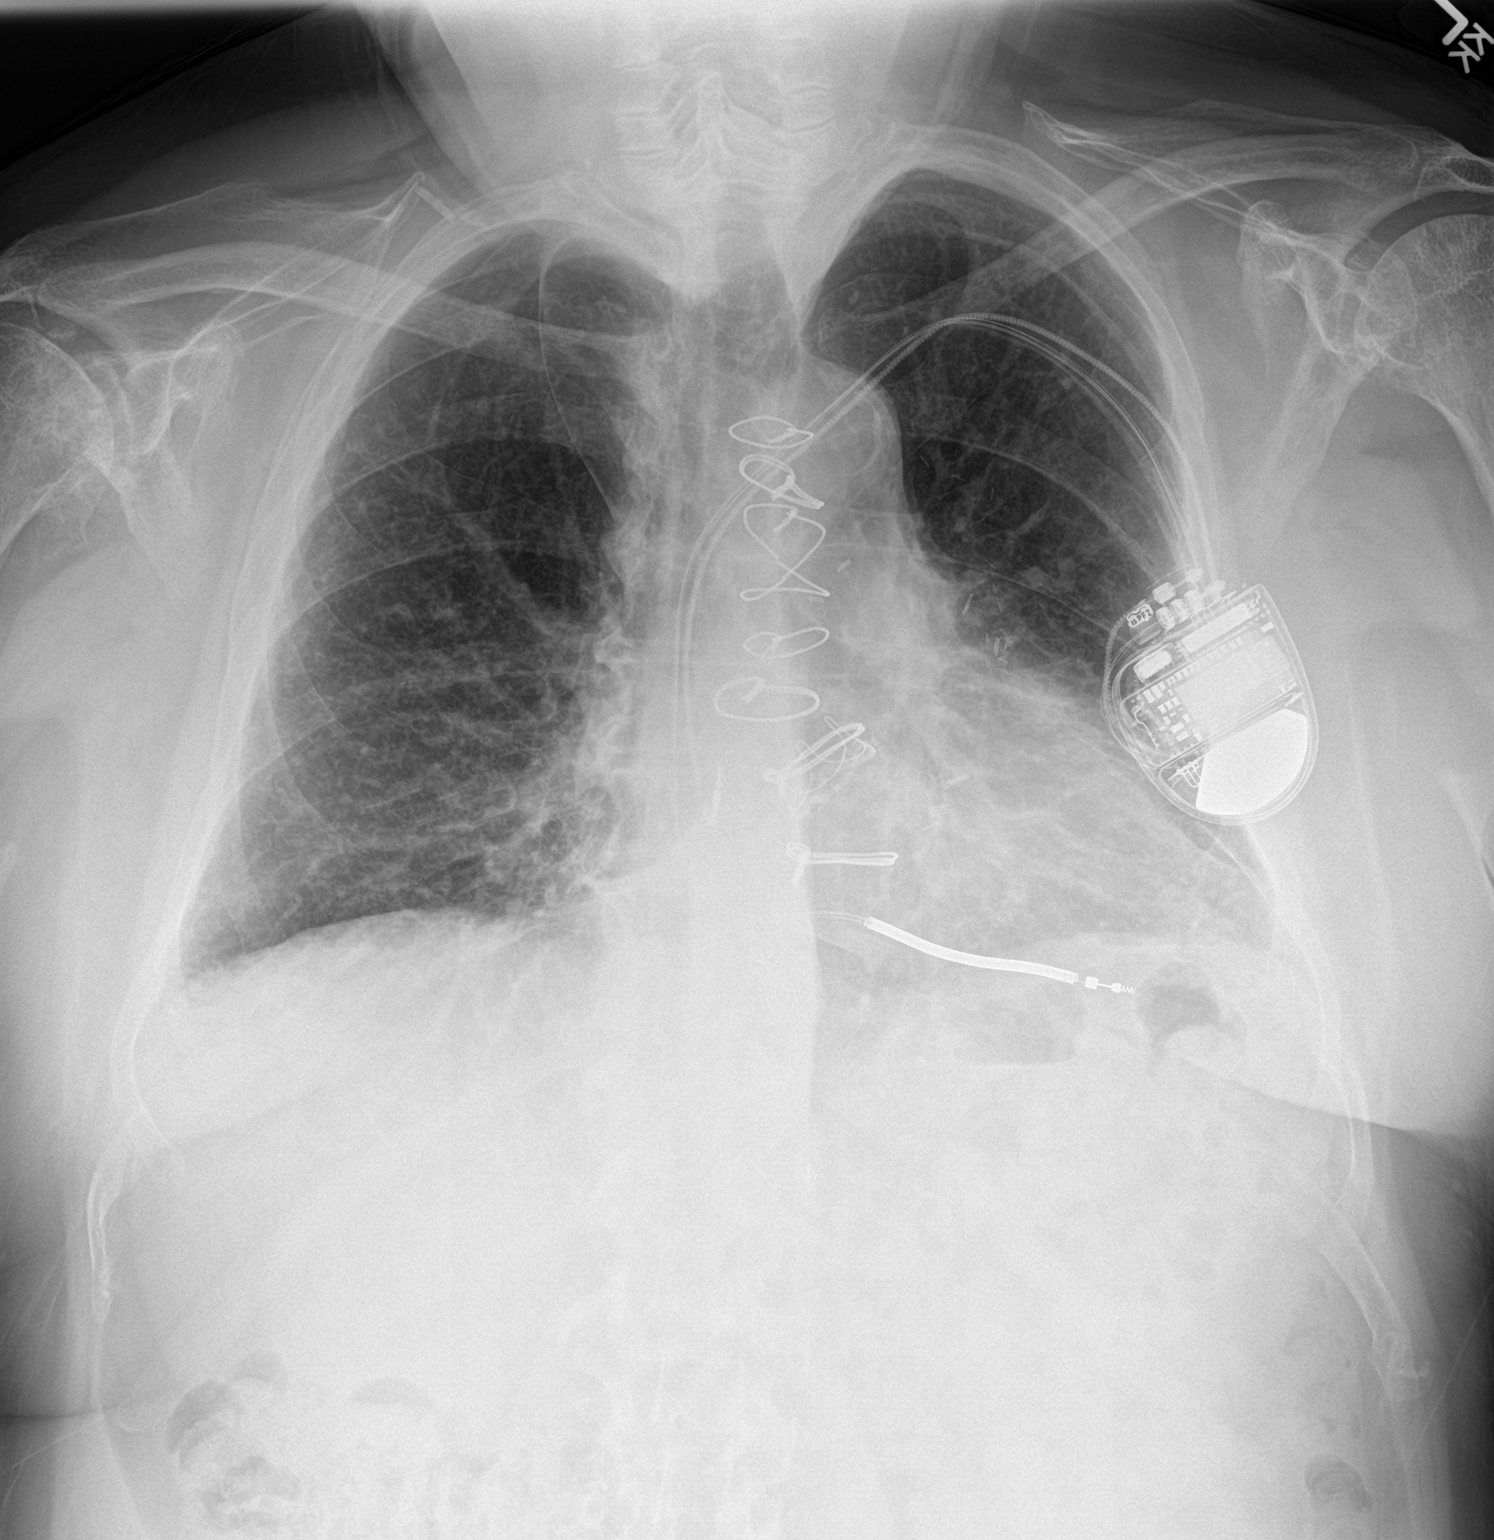
[im 2/2]
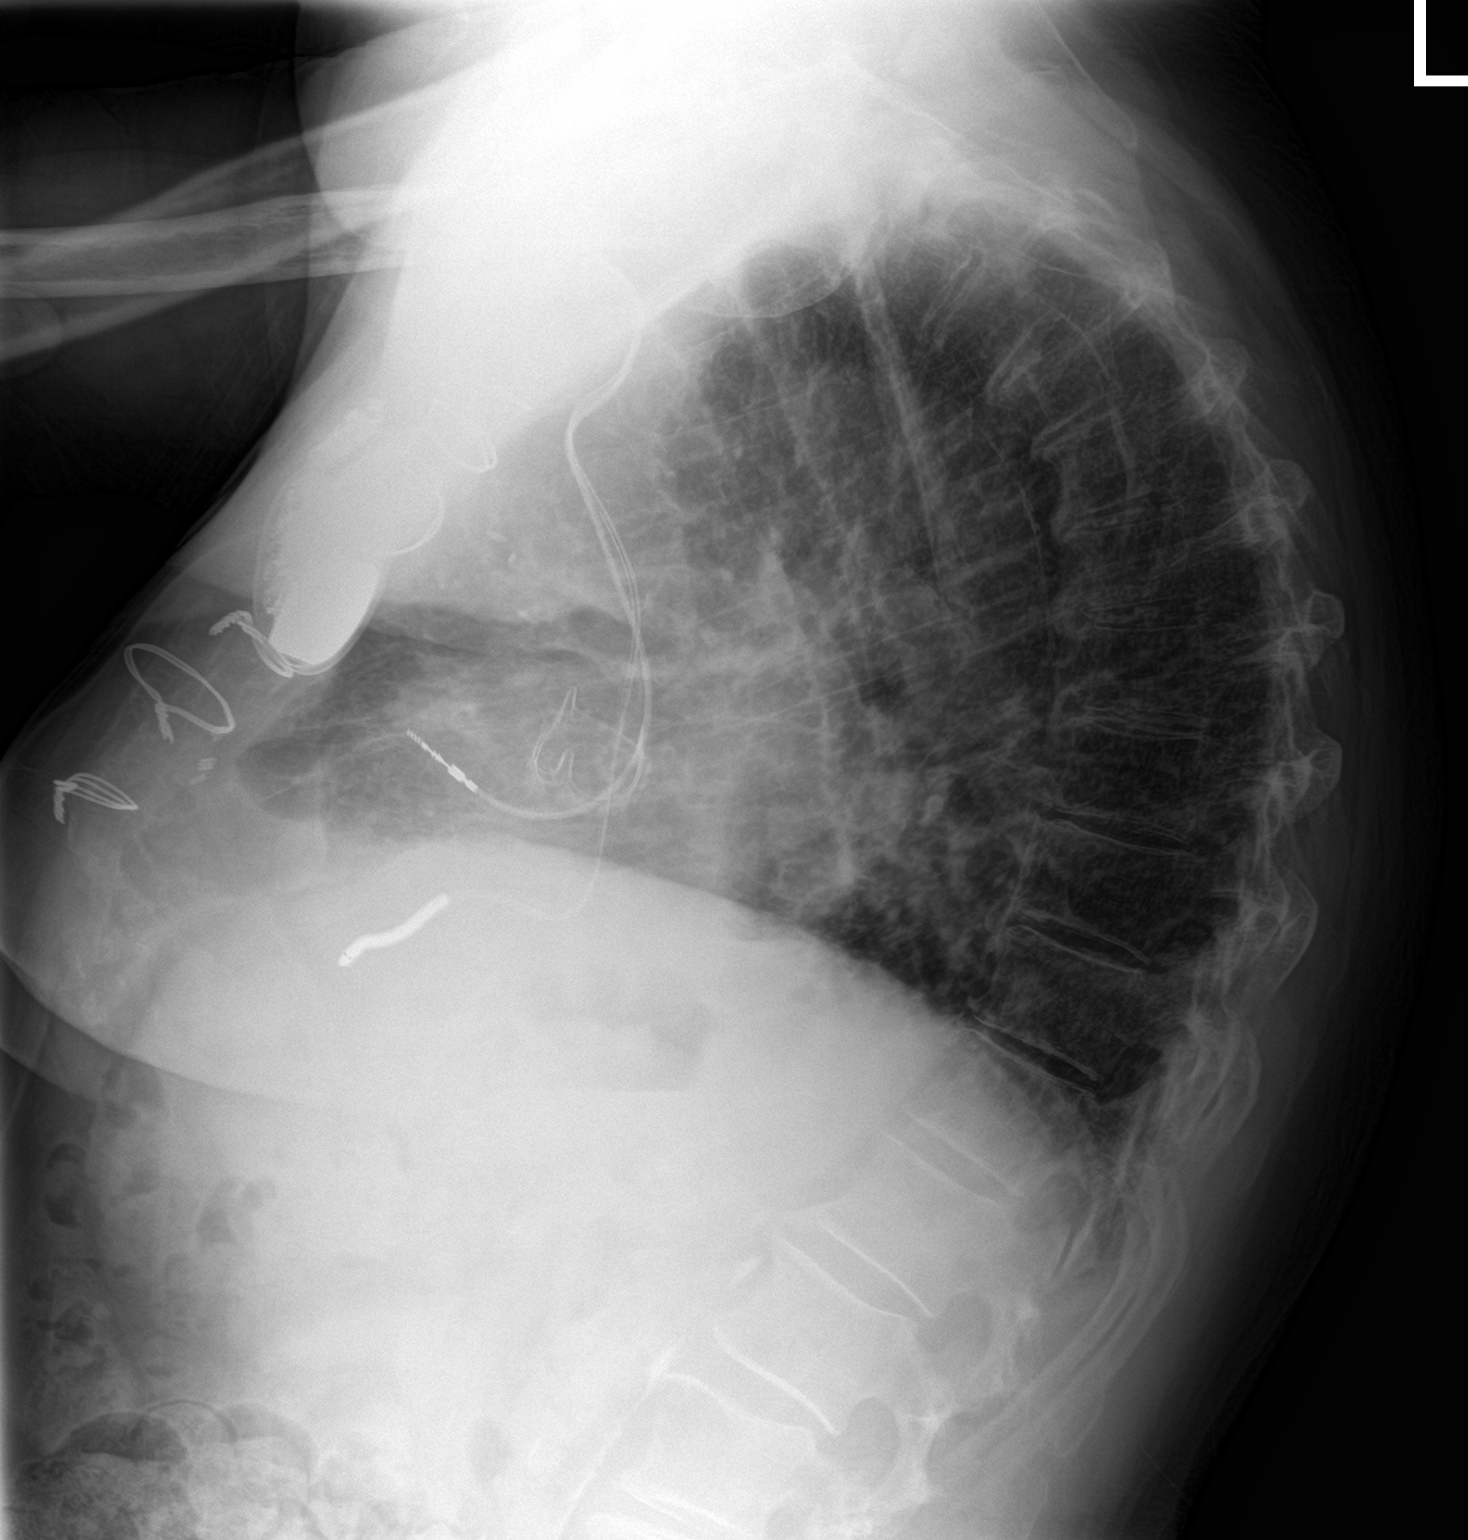

[2 of 2 positions shown; findings below may reference images not displayed]

FINDINGS: Cardiac shadow is stable. Defibrillator and postsurgical changes are
again seen. Aortic calcifications are noted. Azygous lobe is seen.
Chronic interstitial changes are noted without focal infiltrate or
sizable effusion. No acute bony abnormality is noted.
IMPRESSION: No acute abnormality noted.

## 2024-03-17 IMAGING — CR DG CHEST 2V
1 series · 2 of 2 positions shown · non-contrast
Comparison: None Available.

CLINICAL DATA: Subacute cough.

EXAM:
CHEST - 2 VIEW

[Series 1: dg chest 2 view · 0.14mm/px · 2 of 2 slices shown]
[im 1/2]
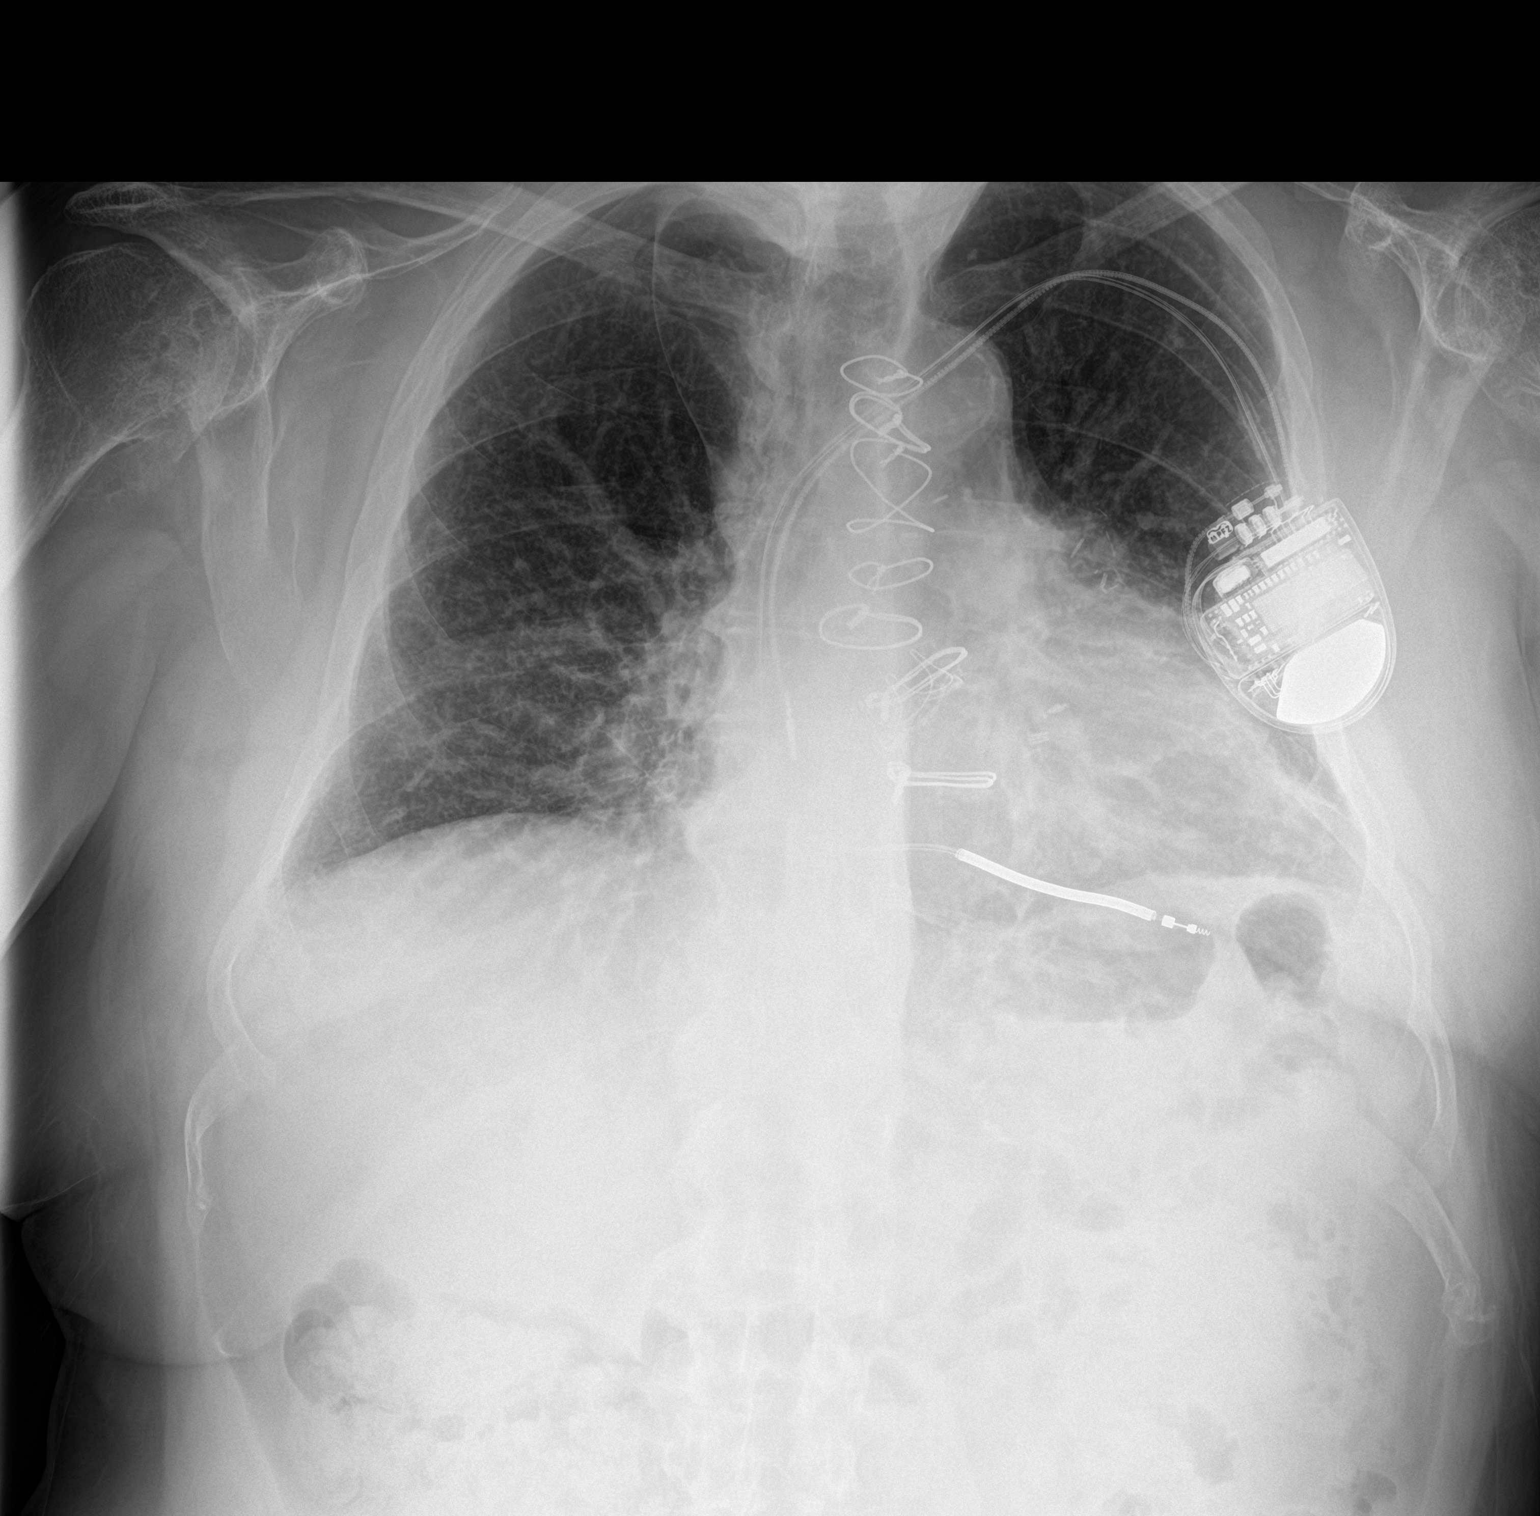
[im 2/2]
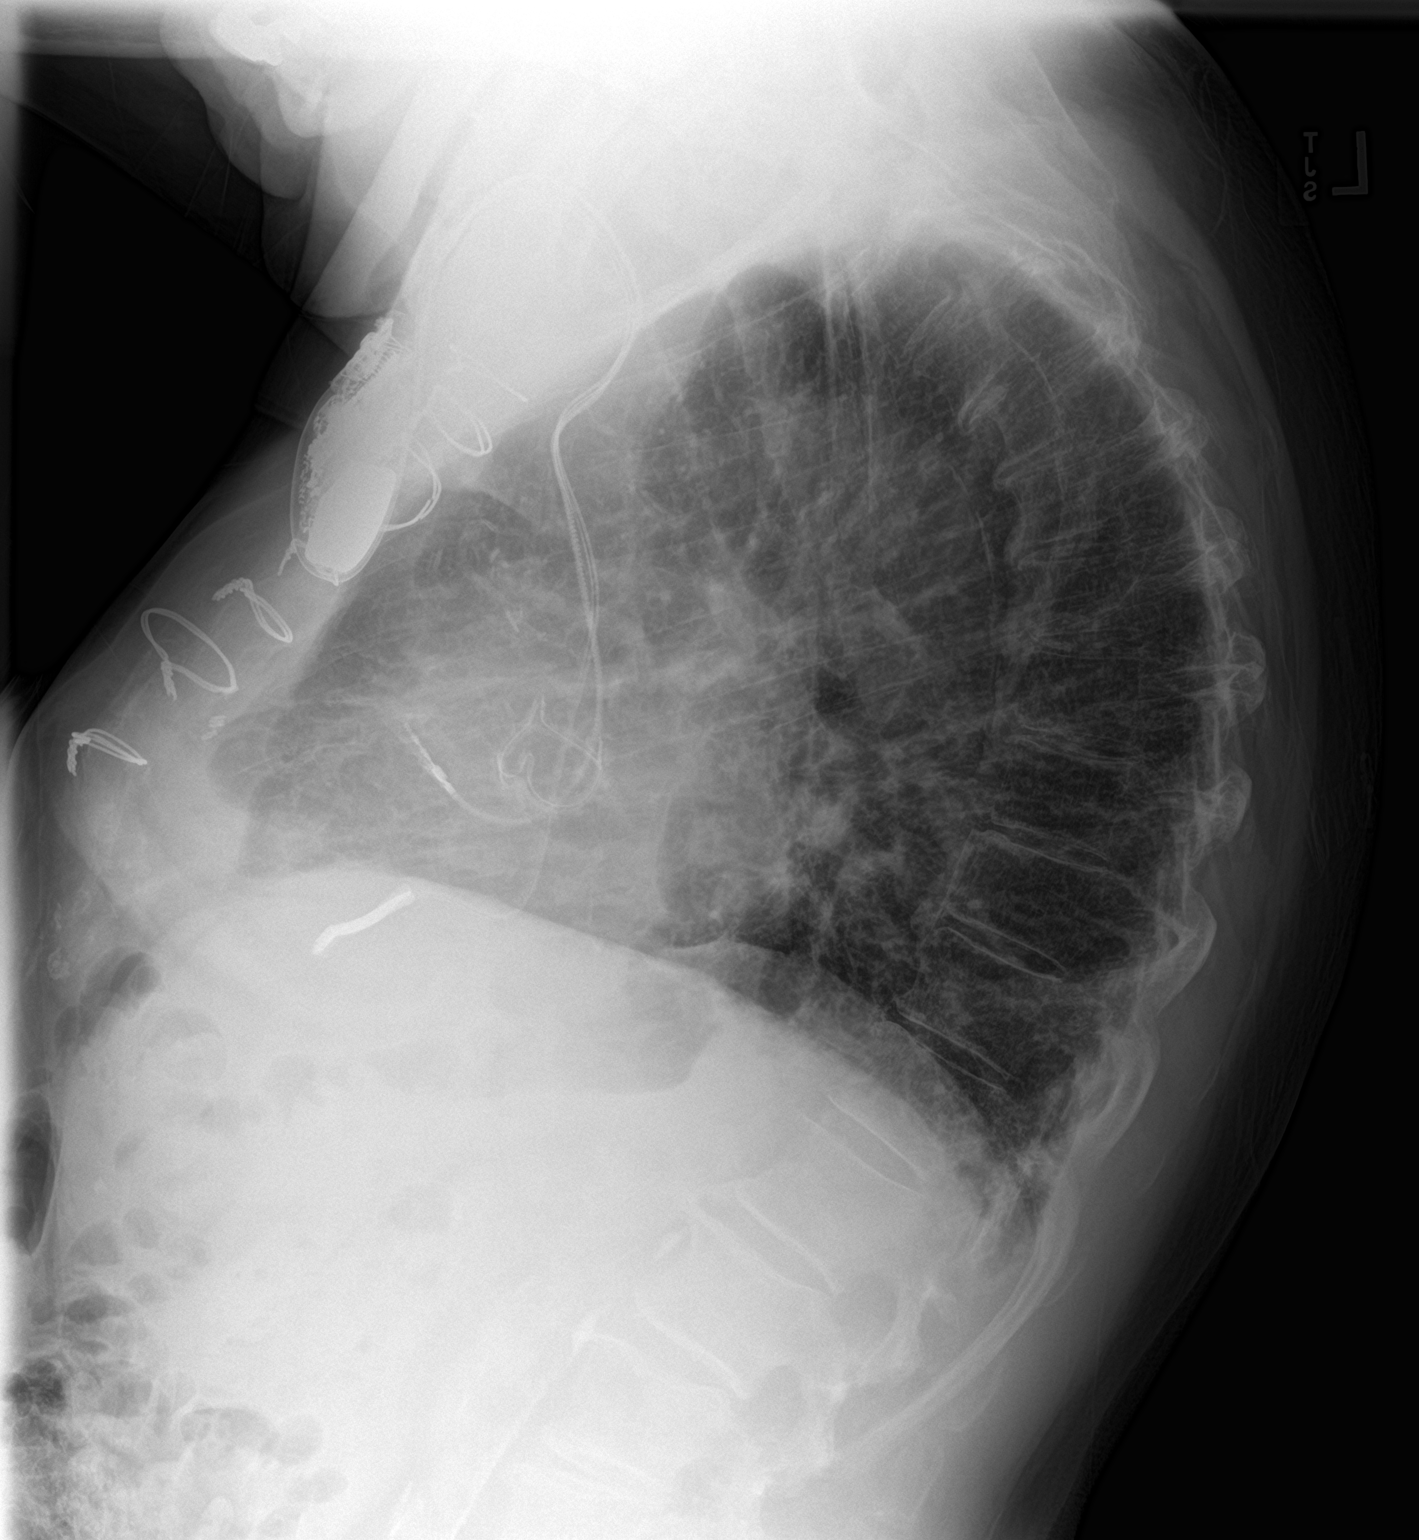

[2 of 2 positions shown; findings below may reference images not displayed]

FINDINGS: The AICD device is in stable position. The cardiac silhouette is
unchanged. The hila and mediastinum are unremarkable. No
pneumothorax. No nodules or masses. Interstitial changes again
identified and unchanged since January 10, 2021. No acute pneumonia
or overt edema.
IMPRESSION: No active cardiopulmonary disease.

## 2024-03-24 ENCOUNTER — Ambulatory Visit: Admitting: Podiatry

## 2024-03-24 DIAGNOSIS — G629 Polyneuropathy, unspecified: Secondary | ICD-10-CM | POA: Diagnosis not present

## 2024-03-24 DIAGNOSIS — M7672 Peroneal tendinitis, left leg: Secondary | ICD-10-CM

## 2024-03-24 NOTE — Progress Notes (Signed)
 "  Subjective:  Patient ID: Samuel Morris, male    DOB: December 04, 1945,  MRN: [id number]  Chief Complaint  Patient presents with   Nail Problem    79 y.o. male presents with the above complaint.  Patient presents with complaint left lateral peroneal tendinitis insertional pain.  He states that started coming back he was doing much better.  He also has secondary complaint neuropathy he is having a lot of neuropathy pain he has a history of diabetes he would like to discuss treatment options for neuropathy.   Review of Systems: Negative except as noted in the HPI. Denies N/V/F/Ch.  Past Medical History:  Diagnosis Date   Arrhythmia    paroxysmal atrial fibrillation   Cancer (HCC)    Scalp   Cataract    CHF (congestive heart failure) (HCC)    COVID-19 11/16/2020   Diabetes mellitus without complication (HCC)    Heart attack (HCC)    Heart disease    Hyperlipidemia    Hypertension    Sleep apnea    Stenosis of aorta    Ventricular tachycardia (HCC)     Current Outpatient Medications:    albuterol  (PROVENTIL ) (2.5 MG/3ML) 0.083% nebulizer solution, USE 1 VIAL VIA NEBULIZER EVERY 6 HOURS AS NEEDED FOR WHEEZING OR SHORTNESS OF BREATH, Disp: 150 mL, Rfl: 0   albuterol  (VENTOLIN  HFA) 108 (90 Base) MCG/ACT inhaler, Inhale 1 puff into the lungs every 4 (four) hours as needed for wheezing or shortness of breath., Disp: 18 g, Rfl: 3   Ascorbic Acid (VITAMIN C PO), Take 100 mg by mouth daily., Disp: , Rfl:    aspirin  EC 81 MG tablet, Take 81 mg by mouth daily., Disp: , Rfl:    Calcium -Magnesium 100-50 MG TABS, Take 1 tablet by mouth daily., Disp: , Rfl:    Cholecalciferol  (VITAMIN D -1000 MAX ST) 25 MCG (1000 UT) tablet, Take 1,000 Units by mouth daily., Disp: , Rfl:    dapagliflozin  propanediol (FARXIGA ) 5 MG TABS tablet, Take 1 tablet (5 mg total) by mouth daily., Disp: 90 tablet, Rfl: 1   ENTRESTO  49-51 MG, Take 1 tablet by mouth 2 (two) times daily., Disp: , Rfl:    ezetimibe  (ZETIA ) 10 MG  tablet, Take 1 tablet (10 mg total) by mouth daily., Disp: 90 tablet, Rfl: 1   fluticasone  (FLONASE) 50 MCG/ACT nasal spray, Place 1 spray into both nostrils daily., Disp: , Rfl:    fluticasone  furoate-vilanterol ([name] ELLIPTA ) 100-25 MCG/ACT AEPB, Inhale 1 puff into the lungs daily., Disp: 60 each, Rfl: 3   folic acid (FOLVITE) 1 MG tablet, 1 tablet, Disp: , Rfl:    furosemide  (LASIX ) 40 MG tablet, Take 1 tablet (40 mg total) by mouth daily., Disp: 90 tablet, Rfl: 1   gabapentin (NEURONTIN) 100 MG capsule, Take by mouth., Disp: , Rfl:    hydroxychloroquine  (PLAQUENIL ) 200 MG tablet, Take 2 tablets (400 mg total) by mouth daily. Dx M06.9, Disp: 180 tablet, Rfl: 1   isosorbide  mononitrate (IMDUR ) 30 MG 24 hr tablet, TAKE 1/2 OF A TABLET (15 MG TOTAL) BY MOUTH DAILY, Disp: 45 tablet, Rfl: 1   methotrexate  (RHEUMATREX) 2.5 MG tablet, Take 6 tablets (15 MG) by mouth weekly for Rheumatoid Arthritis. Dx M06.9 (Patient not taking: Reported on 02/19/2023), Disp: 48 tablet, Rfl: 1   metoprolol  succinate (TOPROL -XL) 100 MG 24 hr tablet, TAKE 1 AND 1/2 TABLETS BY MOUTH DAILY WITH OR IMMEDIATELY FOLLOWING A MEAL, Disp: 45 tablet, Rfl: 1   mexiletine (MEXITIL) 150 MG capsule,  Take 150 mg by mouth 2 (two) times daily., Disp: , Rfl:    nitroGLYCERIN  (NITROSTAT ) 0.4 MG SL tablet, Place 1 tablet (0.4 mg total) under the tongue every 5 (five) minutes as needed for chest pain., Disp: 25 tablet, Rfl: 3   predniSONE  (DELTASONE ) 20 MG tablet, Take 20 mg by mouth daily with breakfast. Pee patient taking 40 mg thru 12/21 then decreasing to 20 mg every day until seen by Pulmonology 12/31, Disp: , Rfl:    predniSONE  (DELTASONE ) 5 MG tablet, Take 5 mg by mouth daily with breakfast. Taking 5 mg tabs 2 tabs-= 10mg  until 12/21 then decrease to 5 mg daily until seen by Pulmonology for further orders, Disp: , Rfl:    rivaroxaban (XARELTO) 20 MG TABS tablet, Take by mouth., Disp: , Rfl:    rosuvastatin  (CRESTOR ) 20 MG tablet, Take 1  tablet (20 mg total) by mouth at bedtime., Disp: 90 tablet, Rfl: 1   spironolactone  (ALDACTONE ) 25 MG tablet, Take 1 tablet (25 mg total) by mouth daily., Disp: 90 tablet, Rfl: 1   sulfamethoxazole-trimethoprim (BACTRIM) 400-80 MG tablet, Take 1 tablet by mouth daily., Disp: , Rfl:    Turmeric (QC TUMERIC COMPLEX PO), Take by mouth daily. Unsure of dose, Disp: , Rfl:    vitamin B-12 (CYANOCOBALAMIN ) 1000 MCG tablet, Take by mouth daily., Disp: , Rfl:   Social History   Tobacco Use  Smoking Status Never  Smokeless Tobacco Never    No Known Allergies Objective:  There were no vitals filed for this visit. There is no height or weight on file to calculate BMI. Constitutional Well developed. Well nourished.  Vascular Dorsalis pedis pulses palpable bilaterally. Posterior tibial pulses palpable bilaterally. Capillary refill normal to all digits.  No cyanosis or clubbing noted. Pedal hair growth normal.  Neurologic Normal speech. Oriented to person, place, and time. Decrease sensation noted to light touch bilaterally.  Decreased protective sensation noted.  Decreased vibratory sensation noted  Dermatologic Nails well groomed and normal in appearance. No open wounds. No skin lesions.  Orthopedic: Pain on palpation to the left lateral foot.  Pain with resisted dorsiflexion of the foot.  No overt pain with plantarflexion inversion of the foot.  No pain at the posterior tibial tendon Achilles tendon plantar fasciitis   Radiographs: None Assessment:   No diagnosis found.  Plan:  Patient was evaluated and treated and all questions answered.  Left peroneal tendinitis -All questions and concerns were discussed with the patient extensive detail given the amount of pain that he is having he will benefit from a steroid injection to help decrease acute inflammatory component associate with pain.  Patient agrees with plan like to proceed with steroid injection I discussed the risk for rupture  associated with it -A steroid injection was performed at left lateral foot at point of maximal tenderness using 1% plain Lidocaine  and 10 mg of Kenalog. This was well tolerated. -Shoe modification discussed  Bilateral neuropathy secondary to diabetes - All questions and concerns were discussed with the patient in extensive detail given the amount of neuropathy is present patient will benefit from referral to Dr. Lazarus for advanced neuropathy care.  Patient agrees with plan.  Referral was placed   No follow-ups on file.  "

## 2024-04-02 ENCOUNTER — Ambulatory Visit

## 2024-06-23 ENCOUNTER — Ambulatory Visit: Admitting: Podiatry
